# Patient Record
Sex: Female | Born: 1935 | Race: Black or African American | Hispanic: No | Marital: Married | Smoking: Never smoker
Health system: Southern US, Community
[De-identification: ages and names within clinical notes are randomized; demographics above are authoritative.]

## PROBLEM LIST (undated history)

## (undated) DIAGNOSIS — E079 Disorder of thyroid, unspecified: Secondary | ICD-10-CM

## (undated) DIAGNOSIS — I10 Essential (primary) hypertension: Secondary | ICD-10-CM

---

## 2005-12-05 ENCOUNTER — Ambulatory Visit: Payer: Self-pay | Admitting: Family Medicine

## 2007-03-22 ENCOUNTER — Emergency Department: Payer: Self-pay

## 2008-03-09 ENCOUNTER — Emergency Department: Payer: Self-pay | Admitting: Emergency Medicine

## 2008-03-09 ENCOUNTER — Other Ambulatory Visit: Payer: Self-pay

## 2008-05-09 ENCOUNTER — Ambulatory Visit: Payer: Self-pay | Admitting: Gastroenterology

## 2010-11-28 ENCOUNTER — Ambulatory Visit: Payer: Self-pay | Admitting: Ophthalmology

## 2010-12-12 ENCOUNTER — Ambulatory Visit: Payer: Self-pay | Admitting: Ophthalmology

## 2011-04-12 ENCOUNTER — Ambulatory Visit: Payer: Self-pay | Admitting: Internal Medicine

## 2014-03-21 ENCOUNTER — Ambulatory Visit: Payer: Self-pay | Admitting: Otolaryngology

## 2014-04-07 ENCOUNTER — Ambulatory Visit: Payer: Self-pay | Admitting: Otolaryngology

## 2014-06-24 ENCOUNTER — Ambulatory Visit: Payer: Self-pay | Admitting: Gastroenterology

## 2018-12-03 ENCOUNTER — Other Ambulatory Visit: Payer: Self-pay

## 2018-12-03 ENCOUNTER — Encounter: Payer: Self-pay | Admitting: Emergency Medicine

## 2018-12-03 ENCOUNTER — Emergency Department
Admission: EM | Admit: 2018-12-03 | Discharge: 2018-12-03 | Disposition: A | Discharge status: Home / Self Care | Payer: Medicare Other | Attending: Emergency Medicine | Admitting: Emergency Medicine

## 2018-12-03 DIAGNOSIS — I10 Essential (primary) hypertension: Secondary | ICD-10-CM | POA: Insufficient documentation

## 2018-12-03 DIAGNOSIS — R4182 Altered mental status, unspecified: Secondary | ICD-10-CM | POA: Diagnosis present

## 2018-12-03 DIAGNOSIS — E860 Dehydration: Secondary | ICD-10-CM | POA: Diagnosis not present

## 2018-12-03 HISTORY — DX: Essential (primary) hypertension: I10

## 2018-12-03 HISTORY — DX: Disorder of thyroid, unspecified: E07.9

## 2018-12-03 LAB — COMPREHENSIVE METABOLIC PANEL
ALBUMIN: 4.2 g/dL (ref 3.5–5.0)
ALT: 14 U/L (ref 0–44)
ANION GAP: 6 (ref 5–15)
AST: 18 U/L (ref 15–41)
Alkaline Phosphatase: 42 U/L (ref 38–126)
BILIRUBIN TOTAL: 0.9 mg/dL (ref 0.3–1.2)
BUN: 8 mg/dL (ref 8–23)
CALCIUM: 9.2 mg/dL (ref 8.9–10.3)
CO2: 27 mmol/L (ref 22–32)
CREATININE: 0.67 mg/dL (ref 0.44–1.00)
Chloride: 101 mmol/L (ref 98–111)
GFR calc Af Amer: >60 mL/min (ref >60)
GFR calc non Af Amer: >60 mL/min (ref >60)
GLUCOSE: 101 mg/dL — AB (ref 70–99)
Potassium: 3.3 mmol/L — ABNORMAL LOW (ref 3.5–5.1)
Sodium: 134 mmol/L — ABNORMAL LOW (ref 135–145)
TOTAL PROTEIN: 7.9 g/dL (ref 6.5–8.1)

## 2018-12-03 LAB — CBC
HCT: 38.2 % (ref 36.0–46.0)
Hemoglobin: 12.4 g/dL (ref 12.0–15.0)
MCH: 28.1 pg (ref 26.0–34.0)
MCHC: 32.5 g/dL (ref 30.0–36.0)
MCV: 86.6 fL (ref 80.0–100.0)
Platelets: 214 10*3/uL (ref 150–400)
RBC: 4.41 MIL/uL (ref 3.87–5.11)
RDW: 14.6 % (ref 11.5–15.5)
WBC: 7.2 10*3/uL (ref 4.0–10.5)
nRBC: 0 % (ref 0.0–0.2)

## 2018-12-03 LAB — URINALYSIS, COMPLETE (UACMP) WITH MICROSCOPIC
Bacteria, UA: NONE SEEN
Bilirubin Urine: NEGATIVE
Glucose, UA: NEGATIVE mg/dL
Hgb urine dipstick: NEGATIVE
Ketones, ur: NEGATIVE mg/dL
Leukocytes,Ua: NEGATIVE
Nitrite: NEGATIVE
Protein, ur: NEGATIVE mg/dL
SPECIFIC GRAVITY, URINE: 1.001 — AB (ref 1.005–1.030)
Squamous Epithelial / HPF: NONE SEEN (ref 0–5)
WBC, UA: NONE SEEN WBC/hpf (ref 0–5)
pH: 7 (ref 5.0–8.0)

## 2018-12-03 LAB — TROPONIN I: Troponin I: <0.03 ng/mL (ref <0.03)

## 2018-12-03 LAB — GLUCOSE, CAPILLARY: Glucose-Capillary: 83 mg/dL (ref 70–99)

## 2018-12-03 MED ORDER — SODIUM CHLORIDE 0.9 % IV SOLN
Freq: Once | INTRAVENOUS | Status: AC
  Administered 2018-12-03: 13:00:00 via INTRAVENOUS

## 2018-12-03 NOTE — ED Notes (Signed)
Arrived at pt room to discharge- pt, son, and all belongings were gone- pt spoke with doctor prior to discharge but did not speak to nurse or receive discharge papers- no discharge signature obtained

## 2018-12-03 NOTE — ED Notes (Signed)
FIRST NURSE NOTE: son states he wants pt to be checked for poss dehydration and UTI due to increased confusion. PT in NAD, ambulatory

## 2018-12-03 NOTE — ED Provider Notes (Signed)
[facility] Emergency Department Provider Note       Time seen: ----------------------------------------- 12:32 PM on 12/03/2018 -----------------------------------------   I have reviewed the triage vital signs and the nursing notes.  HISTORY   Chief Complaint Altered Mental Status    HPI Nicole Ramsey is a 85 y.o. female with a history of hypertension, thyroid disease, anxiety who presents to the ED for possible dehydration.  Son reports she has not had anything to eat or drink today, did drink yesterday.  Patient states when she gets upset she does not eat anything.  He denies any recent illness she denies any complaints at this time.  Past Medical History:  Diagnosis Date  . Hypertension   . Thyroid disease     There are no active problems to display for this patient.   History reviewed. No pertinent surgical history.  Allergies Patient has no allergy information on record.  Social History Social History   Tobacco Use  . Smoking status: Never Smoker  Substance Use Topics  . Alcohol use: Not Currently  . Drug use: Not on file   Review of Systems Constitutional: Negative for fever. Cardiovascular: Negative for chest pain. Respiratory: Negative for shortness of breath. Gastrointestinal: Negative for abdominal pain, vomiting and diarrhea. Musculoskeletal: Negative for back pain. Skin: Negative for rash. Neurological: Negative for headaches, focal weakness or numbness.  All systems negative/normal/unremarkable except as stated in the HPI  ____________________________________________   PHYSICAL EXAM:  VITAL SIGNS: ED Triage Vitals  Enc Vitals Group     BP 12/03/18 1204 (!) 153/58     Pulse Rate 12/03/18 1204 (!) 59     Resp 12/03/18 1204 16     Temp 12/03/18 1204 98.3 F (36.8 C)     Temp Source 12/03/18 1204 Oral     SpO2 12/03/18 1204 100 %     Weight 12/03/18 1205 130 lb (59 kg)     Height 12/03/18 1205 5\' 5"  (1.651  m)     Head Circumference --      Peak Flow --      Pain Score 12/03/18 1204 0     Pain Loc --      Pain Edu? --      Excl. in GC? --    Constitutional: Well appearing and in no distress. ENT      Head: Normocephalic and atraumatic.      Nose: No congestion/rhinnorhea.      Mouth/Throat: Mucous membranes are moist.      Neck: No stridor. Cardiovascular: Normal rate, regular rhythm. No murmurs, rubs, or gallops. Respiratory: Normal respiratory effort without tachypnea nor retractions. Breath sounds are clear and equal bilaterally. No wheezes/rales/rhonchi. Gastrointestinal: Soft and nontender. Normal bowel sounds Musculoskeletal: Nontender with normal range of motion in extremities. No lower extremity tenderness nor edema. Neurologic:  Normal speech and language. No gross focal neurologic deficits are appreciated.  Skin:  Skin is warm, dry and intact. No rash noted. Psychiatric: Mood and affect are normal. Speech and behavior are normal.  ____________________________________________  ED COURSE:  As part of my medical decision making, I reviewed the following data within the electronic MEDICAL RECORD NUMBER History obtained from family if available, nursing notes, old chart and ekg, as well as notes from prior ED visits. Patient presented for weakness, we will assess with labs and imaging as indicated at this time.   Procedures ____________________________________________   LABS (pertinent positives/negatives)  Labs Reviewed  COMPREHENSIVE METABOLIC PANEL - Abnormal; Notable  for the following components:      Result Value   Sodium 134 (*)    Potassium 3.3 (*)    Glucose, Bld 101 (*)    All other components within normal limits  URINALYSIS, COMPLETE (UACMP) WITH MICROSCOPIC - Abnormal; Notable for the following components:   Color, Urine COLORLESS (*)    APPearance CLEAR (*)    Specific Gravity, Urine 1.001 (*)    All other components within normal limits  CBC  TROPONIN I   GLUCOSE, CAPILLARY  CBG MONITORING, ED   ___________________________________________   DIFFERENTIAL DIAGNOSIS   Dehydration, occult infection, electrolyte abnormality, depression, anxiety  FINAL ASSESSMENT AND PLAN  Dehydration   Plan: The patient had presented for weakness and possible dehydration. Patient's labs were reassuring, although we did give her a liter of fluids.  No acute emergency medical condition was identified.  She appears to have possible mild dementia with memory changes and some paranoid behavior.  I will advise close outpatient follow-up with her doctor.   Ulice Dash, MD    Note: This note was generated in part or whole with voice recognition software. Voice recognition is usually quite accurate but there are transcription errors that can and very often do occur. I apologize for any typographical errors that were not detected and corrected.     Emily Filbert, MD 12/03/18 1434

## 2018-12-03 NOTE — ED Notes (Signed)
Pt states not liking being home alone at night- her daughter usually takes care of her but is unable to d/t having a fall- son states that she has been calling about someone being in her house at night and calls someone to check on her and no one else is in the house- states she is fine one day and then other days she seems "off"

## 2018-12-03 NOTE — ED Notes (Signed)
Pt and family refused EKG

## 2018-12-03 NOTE — ED Triage Notes (Signed)
Pt arrives with son. Pt denies complaints in triage. Son states he is concerned over possible dehydration. Son also states pt has "been acting strange." Son expressed her ams has been a progression over the last 4 months but this is the first time she has been evaluated for it. Son from out of town and wanted to make sure she was evaluated prior to his travel home. In triage pt a& o x 4.

## 2018-12-11 ENCOUNTER — Emergency Department
Admission: EM | Admit: 2018-12-11 | Discharge: 2018-12-11 | Discharge status: Against Medical Advice | Payer: Medicare Other | Attending: Emergency Medicine | Admitting: Emergency Medicine

## 2018-12-11 ENCOUNTER — Encounter: Payer: Self-pay | Admitting: Emergency Medicine

## 2018-12-11 ENCOUNTER — Emergency Department: Payer: Medicare Other

## 2018-12-11 ENCOUNTER — Other Ambulatory Visit: Payer: Self-pay

## 2018-12-11 DIAGNOSIS — R4182 Altered mental status, unspecified: Secondary | ICD-10-CM

## 2018-12-11 DIAGNOSIS — Z5321 Procedure and treatment not carried out due to patient leaving prior to being seen by health care provider: Secondary | ICD-10-CM | POA: Insufficient documentation

## 2018-12-11 NOTE — ED Notes (Signed)
Patients son walked patient out to leave ER at this time. Reports he is leaving.

## 2018-12-11 NOTE — ED Triage Notes (Signed)
PT arrives with family. Pt was seen and evaluated last week for the same complaints. Pt's family states he brought his mother in today to seek resources for home health. Pt initially was taken to [facility] for resources who directed pt to come to the ED. No new complaints addressed.

## 2018-12-11 NOTE — ED Notes (Signed)
Son present with patient. Reports he does not want blood or EKG completed. MD Darnelle Catalan made aware and reports he will step back in room to speak with son.

## 2018-12-11 NOTE — ED Provider Notes (Signed)
[facility] Emergency Department Provider Note   ____________________________________________   First MD Initiated Contact with Patient 12/11/18 1729     (approximate)  I have reviewed the triage vital signs and the nursing notes.   HISTORY  Chief Complaint Altered Mental Status   HPI Nicole Ramsey is a 85 y.o. female who was referred here from Dr. Stasia Cavalier office.  Dr. Laural Benes came and spoke with me personally about this patient.  He reported that she has become somewhat paranoid blocking off some of her doors and and living in the corner of the bedroom as I understand it.  She has had home health arranged and home health nurse has been there and reported she is not acting right.  Dr. Letitia Libra wanted me to check on her medically get a head CT TSH etc. and then have psychiatry see her.  I spoke with the son as the patient was going off to get her head CT and told him I would be seeing her shortly and had put some orders in the computer.  The nurse went to get the blood work and the son says he she just had blood work which she did on the fifth.  And he did not want anymore.  I was at the time involved in seeing 2 patients with shortness of breath and chest pain and asked the tech to let them know that I would be in there as soon as I could and see the patient and also that I was waiting for the results of the head CT.  Patient then apparently got with her son and they walked out.  They did not even tell the nurse nurse did see them as they walked out and tried to stop them.     Past Medical History:  Diagnosis Date  . Hypertension   . Thyroid disease     There are no active problems to display for this patient.   History reviewed. No pertinent surgical history.  Prior to Admission medications   Not on File    Allergies Patient has no allergy information on record.  No family history on file.  Social History Social History   Tobacco Use  .  Smoking status: Never Smoker  Substance Use Topics  . Alcohol use: Not Currently  . Drug use: Not on file    Review of Systems Unable to perform as patient and family were uncooperative and left ____________________________________________   PHYSICAL EXAM:  Unable to perform as patient and family were uncooperative and left __________________________________   LABS (all labs ordered are listed, but only abnormal results are displayed)  Labs Reviewed  COMPREHENSIVE METABOLIC PANEL  TROPONIN I  CBC WITH DIFFERENTIAL/PLATELET  URINALYSIS, COMPLETE (UACMP) WITH MICROSCOPIC  URINE DRUG SCREEN, QUALITATIVE ([facility] ONLY)  TSH   ____________________________________________  EKG  _______________________________________  RADIOLOGY  ED MD interpretation: CT read by radiology as no acute disease volume loss with patchy periventricular and small vessel disease son refused a chest x-ray which I would have used to check for pneumonia or CHF cardiomegaly etc.  Official radiology report(s): Ct Head Wo Contrast  Result Date: 12/11/2018 CLINICAL DATA:  Increased confusion EXAM: CT HEAD WITHOUT CONTRAST TECHNIQUE: Contiguous axial images were obtained from the base of the skull through the vertex without intravenous contrast. COMPARISON:  March 22, 2007 FINDINGS: Brain: There is age related volume loss. There is no intracranial mass, hemorrhage, extra-axial fluid collection, or midline shift. There is patchy small vessel disease  in the centra semiovale bilaterally. No acute infarct is appreciable on this study. Vascular: There is no appreciable hyperdense vessel. There is calcification in each carotid siphon region. Skull: The bony calvarium appears intact. Sinuses/Orbits: Visualized paranasal sinuses are clear. Visualized orbits appear symmetric bilaterally. Other: Mastoid air cells are clear. IMPRESSION: Age related volume loss with patchy periventricular small vessel disease. No acute infarct  evident. No mass or hemorrhage. There are foci of arterial vascular calcification. Electronically Signed   By: Bretta Bang III M.D.   On: 12/11/2018 17:53    ____________________________________________   PROCEDURES  Procedure(s) performed (including Critical Care):  Procedures   ____________________________________________   INITIAL IMPRESSION / ASSESSMENT AND PLAN / ED COURSE  Son and patient refused to wait for me while I was busy seeing acutely ill patients.  They understood that they were supposed to be seen by psychiatry as well but would not wait for that either.            ____________________________________________   FINAL CLINICAL IMPRESSION(S) / ED DIAGNOSES  Final diagnoses:  Altered mental status, unspecified altered mental status type     ED Discharge Orders    None       Note:  This document was prepared using Dragon voice recognition software and may include unintentional dictation errors.    Arnaldo Natal, MD 12/11/18 Ebony Cargo

## 2018-12-11 NOTE — ED Notes (Addendum)
Patients son brought pt in for additional resources to get her help at home. Reports he was just here on 5th and only needs to speak with someone about resources and that her MD wanted her to be evaluated by psychiatrist. Also states he wanted mom to have a MRI possibly. Patient is disoriented X4 during assessment. Cooperative with son at bedside

## 2019-11-15 ENCOUNTER — Ambulatory Visit: Payer: Medicare Other | Attending: Internal Medicine

## 2019-11-15 DIAGNOSIS — Z23 Encounter for immunization: Secondary | ICD-10-CM

## 2019-11-15 NOTE — Progress Notes (Signed)
   Covid-19 Vaccination Clinic  Name:  REBACCA VOTAW    MRN: [id number] DOB: 1927/07/01  11/15/2019  Ms. Koob was observed post Covid-19 immunization for 15 minutes without incidence. She was provided with Vaccine Information Sheet and instruction to access the V-Safe system.   Ms. Dwyer was instructed to call 911 with any severe reactions post vaccine: Marland Kitchen Difficulty breathing  . Swelling of your face and throat  . A fast heartbeat  . A bad rash all over your body  . Dizziness and weakness    Immunizations Administered    Name Date Dose VIS Date Route   Pfizer COVID-19 Vaccine 11/15/2019 12:05 PM 0.3 mL 09/10/2019 Intramuscular   Manufacturer: [employer], [employer]   Lot: [id number]   NDC: [address]-4

## 2019-12-14 ENCOUNTER — Ambulatory Visit: Payer: Medicare Other | Attending: Internal Medicine

## 2019-12-14 DIAGNOSIS — Z23 Encounter for immunization: Secondary | ICD-10-CM

## 2019-12-14 NOTE — Progress Notes (Signed)
   Covid-19 Vaccination Clinic  Name:  LAINA GUERRIERI    MRN: [id number] DOB: [birth year]  12/14/2019  Ms. Mountjoy was observed post Covid-19 immunization for 15 minutes without incident. She was provided with Vaccine Information Sheet and instruction to access the V-Safe system.   Ms. Livesay was instructed to call 911 with any severe reactions post vaccine: Marland Kitchen Difficulty breathing  . Swelling of face and throat  . A fast heartbeat  . A bad rash all over body  . Dizziness and weakness   Immunizations Administered    Name Date Dose VIS Date Route   Pfizer COVID-19 Vaccine 12/14/2019 12:42 PM 0.3 mL 09/10/2019 Intramuscular   Manufacturer: [employer], [employer]   Lot: [id number]   NDC: [address]-2

## 2020-10-27 ENCOUNTER — Other Ambulatory Visit: Payer: Self-pay

## 2020-10-27 ENCOUNTER — Inpatient Hospital Stay
Admission: EM | Admit: 2020-10-27 | Discharge: 2020-11-10 | DRG: 885 | Disposition: A | Discharge status: Skilled Nursing Facility | Payer: Medicare Other | Attending: Internal Medicine | Admitting: Internal Medicine

## 2020-10-27 ENCOUNTER — Emergency Department: Payer: Medicare Other

## 2020-10-27 DIAGNOSIS — Z9010 Acquired absence of unspecified breast and nipple: Secondary | ICD-10-CM

## 2020-10-27 DIAGNOSIS — R54 Age-related physical debility: Secondary | ICD-10-CM | POA: Diagnosis present

## 2020-10-27 DIAGNOSIS — R9082 White matter disease, unspecified: Secondary | ICD-10-CM | POA: Diagnosis present

## 2020-10-27 DIAGNOSIS — D890 Polyclonal hypergammaglobulinemia: Secondary | ICD-10-CM | POA: Diagnosis present

## 2020-10-27 DIAGNOSIS — Z6821 Body mass index (BMI) 21.0-21.9, adult: Secondary | ICD-10-CM

## 2020-10-27 DIAGNOSIS — I4510 Unspecified right bundle-branch block: Secondary | ICD-10-CM | POA: Diagnosis present

## 2020-10-27 DIAGNOSIS — F331 Major depressive disorder, recurrent, moderate: Principal | ICD-10-CM

## 2020-10-27 DIAGNOSIS — I10 Essential (primary) hypertension: Secondary | ICD-10-CM

## 2020-10-27 DIAGNOSIS — Z8619 Personal history of other infectious and parasitic diseases: Secondary | ICD-10-CM

## 2020-10-27 DIAGNOSIS — E43 Unspecified severe protein-calorie malnutrition: Secondary | ICD-10-CM | POA: Insufficient documentation

## 2020-10-27 DIAGNOSIS — F22 Delusional disorders: Secondary | ICD-10-CM

## 2020-10-27 DIAGNOSIS — R55 Syncope and collapse: Secondary | ICD-10-CM | POA: Diagnosis present

## 2020-10-27 DIAGNOSIS — F419 Anxiety disorder, unspecified: Secondary | ICD-10-CM | POA: Diagnosis present

## 2020-10-27 DIAGNOSIS — R627 Adult failure to thrive: Secondary | ICD-10-CM

## 2020-10-27 DIAGNOSIS — Z8249 Family history of ischemic heart disease and other diseases of the circulatory system: Secondary | ICD-10-CM

## 2020-10-27 DIAGNOSIS — G309 Alzheimer's disease, unspecified: Secondary | ICD-10-CM

## 2020-10-27 DIAGNOSIS — Z23 Encounter for immunization: Secondary | ICD-10-CM

## 2020-10-27 DIAGNOSIS — R440 Auditory hallucinations: Secondary | ICD-10-CM

## 2020-10-27 DIAGNOSIS — Z66 Do not resuscitate: Secondary | ICD-10-CM | POA: Diagnosis not present

## 2020-10-27 DIAGNOSIS — R946 Abnormal results of thyroid function studies: Secondary | ICD-10-CM | POA: Diagnosis present

## 2020-10-27 DIAGNOSIS — Z20822 Contact with and (suspected) exposure to covid-19: Secondary | ICD-10-CM | POA: Diagnosis present

## 2020-10-27 DIAGNOSIS — K219 Gastro-esophageal reflux disease without esophagitis: Secondary | ICD-10-CM | POA: Diagnosis present

## 2020-10-27 DIAGNOSIS — F02818 Alzheimer's disease, unspecified: Secondary | ICD-10-CM

## 2020-10-27 DIAGNOSIS — R4182 Altered mental status, unspecified: Secondary | ICD-10-CM | POA: Diagnosis not present

## 2020-10-27 DIAGNOSIS — F0281 Dementia in other diseases classified elsewhere with behavioral disturbance: Secondary | ICD-10-CM | POA: Diagnosis present

## 2020-10-27 DIAGNOSIS — K5900 Constipation, unspecified: Secondary | ICD-10-CM | POA: Diagnosis not present

## 2020-10-27 DIAGNOSIS — E039 Hypothyroidism, unspecified: Secondary | ICD-10-CM

## 2020-10-27 DIAGNOSIS — Z90710 Acquired absence of both cervix and uterus: Secondary | ICD-10-CM

## 2020-10-27 LAB — URINALYSIS, COMPLETE (UACMP) WITH MICROSCOPIC
Bacteria, UA: NONE SEEN
Bilirubin Urine: NEGATIVE
Glucose, UA: NEGATIVE mg/dL
Hgb urine dipstick: NEGATIVE
Ketones, ur: NEGATIVE mg/dL
Leukocytes,Ua: NEGATIVE
Nitrite: NEGATIVE
Protein, ur: NEGATIVE mg/dL
Specific Gravity, Urine: 1.011 (ref 1.005–1.030)
pH: 7 (ref 5.0–8.0)

## 2020-10-27 LAB — CBC
HCT: 39.7 % (ref 36.0–46.0)
Hemoglobin: 12.7 g/dL (ref 12.0–15.0)
MCH: 28.5 pg (ref 26.0–34.0)
MCHC: 32 g/dL (ref 30.0–36.0)
MCV: 89 fL (ref 80.0–100.0)
Platelets: 166 10*3/uL (ref 150–400)
RBC: 4.46 MIL/uL (ref 3.87–5.11)
RDW: 14.8 % (ref 11.5–15.5)
WBC: 3.9 10*3/uL — ABNORMAL LOW (ref 4.0–10.5)
nRBC: 0 % (ref 0.0–0.2)

## 2020-10-27 LAB — COMPREHENSIVE METABOLIC PANEL
ALT: 10 U/L (ref 0–44)
AST: 15 U/L (ref 15–41)
Albumin: 3.9 g/dL (ref 3.5–5.0)
Alkaline Phosphatase: 48 U/L (ref 38–126)
Anion gap: 8 (ref 5–15)
BUN: 11 mg/dL (ref 8–23)
CO2: 28 mmol/L (ref 22–32)
Calcium: 9.4 mg/dL (ref 8.9–10.3)
Chloride: 105 mmol/L (ref 98–111)
Creatinine, Ser: 0.85 mg/dL (ref 0.44–1.00)
GFR, Estimated: >60 mL/min (ref >60)
Glucose, Bld: 114 mg/dL — ABNORMAL HIGH (ref 70–99)
Potassium: 3.8 mmol/L (ref 3.5–5.1)
Sodium: 141 mmol/L (ref 135–145)
Total Bilirubin: 1 mg/dL (ref 0.3–1.2)
Total Protein: 7.4 g/dL (ref 6.5–8.1)

## 2020-10-27 LAB — CBG MONITORING, ED: Glucose-Capillary: 113 mg/dL — ABNORMAL HIGH (ref 70–99)

## 2020-10-27 LAB — TSH: TSH: 11.17 u[IU]/mL — ABNORMAL HIGH (ref 0.350–4.500)

## 2020-10-27 MED ORDER — LEVOTHYROXINE SODIUM 50 MCG PO TABS
50.0000 ug | ORAL_TABLET | Freq: Every day | ORAL | Status: DC
  Administered 2020-10-28 – 2020-10-29 (×2): 50 ug via ORAL
  Filled 2020-10-27 (×2): qty 1

## 2020-10-27 MED ORDER — ESCITALOPRAM OXALATE 10 MG PO TABS
5.0000 mg | ORAL_TABLET | Freq: Every day | ORAL | Status: DC
  Administered 2020-10-27 – 2020-11-10 (×15): 5 mg via ORAL
  Filled 2020-10-27 (×5): qty 0.5
  Filled 2020-10-27: qty 1
  Filled 2020-10-27 (×4): qty 0.5
  Filled 2020-10-27 (×3): qty 1
  Filled 2020-10-27 (×3): qty 0.5

## 2020-10-27 MED ORDER — ONDANSETRON 4 MG PO TBDP
ORAL_TABLET | ORAL | Status: AC
  Filled 2020-10-27: qty 1

## 2020-10-27 MED ORDER — ATENOLOL 50 MG PO TABS
50.0000 mg | ORAL_TABLET | Freq: Every day | ORAL | Status: DC
  Administered 2020-10-27 – 2020-11-10 (×15): 50 mg via ORAL
  Filled 2020-10-27 (×2): qty 1
  Filled 2020-10-27: qty 2
  Filled 2020-10-27 (×2): qty 1
  Filled 2020-10-27: qty 2
  Filled 2020-10-27: qty 1
  Filled 2020-10-27: qty 2
  Filled 2020-10-27 (×6): qty 1
  Filled 2020-10-27: qty 2

## 2020-10-27 MED ORDER — PANTOPRAZOLE SODIUM 40 MG PO TBEC
40.0000 mg | DELAYED_RELEASE_TABLET | Freq: Every day | ORAL | Status: DC
  Administered 2020-10-27 – 2020-11-10 (×15): 40 mg via ORAL
  Filled 2020-10-27 (×15): qty 1

## 2020-10-27 MED ORDER — QUETIAPINE FUMARATE 25 MG PO TABS
25.0000 mg | ORAL_TABLET | Freq: Every day | ORAL | Status: DC
  Administered 2020-10-27 – 2020-11-09 (×13): 25 mg via ORAL
  Filled 2020-10-27 (×14): qty 1

## 2020-10-27 MED ORDER — AMLODIPINE BESYLATE 10 MG PO TABS
10.0000 mg | ORAL_TABLET | Freq: Every day | ORAL | Status: DC
  Administered 2020-10-27 – 2020-11-10 (×15): 10 mg via ORAL
  Filled 2020-10-27 (×4): qty 1
  Filled 2020-10-27 (×2): qty 2
  Filled 2020-10-27 (×3): qty 1
  Filled 2020-10-27: qty 2
  Filled 2020-10-27: qty 1
  Filled 2020-10-27: qty 2
  Filled 2020-10-27 (×3): qty 1

## 2020-10-27 NOTE — ED Notes (Signed)
Pt discusses with Dr. Toni Amend that she does not currently feel comfortable going back home by herself. She does state that she would feel more comfortable if a family member stayed home with her. States she does not think she needs psychiatric inpt admission

## 2020-10-27 NOTE — ED Notes (Signed)
Pt able to stand and pivot to the restroom at this time.  

## 2020-10-27 NOTE — ED Notes (Signed)
Dr. Penne Lash at bedside for evaluation.

## 2020-10-27 NOTE — BH Assessment (Signed)
Comprehensive Clinical Assessment (CCA) Screening, Triage and Referral Note  10/27/2020 Nicole Ramsey [id number]   Nicole Ramsey is a 87 y.o female who presents to [facility] ED voluntarily for treatment. Per triage note, BIB [facility] from home due to AMS. Pt alert to self, place and situation. States that she lives alone in a big house and is scared to be at home alone.  During TTS assessment pt presents calm, pleasant, sad, slow/soft speech, some disorganization but retractable and oriented x 5, anxious but cooperative, and mood-congruent with affect. The pt does not appear to be responding to internal or external stimuli. Neither is the pt presenting with any delusional thinking. Pt was able to verify the information provided to triage RN and identified her main compliant to be increased AH, depression, paranoia, and fear of living alone since December 2021. Pt denies any recent changes taking place but shared about the loss of her oldest foster daughter who spiled her and cared for her "very well" before she passed away 4 years ago. Pt reports feeling fearful to live alone due someone contacting her threatening to kill her and the strange noises she identifies as a machine with a red light outside her window keeping her awake and causing her to feel nervous. Pt reports to have an idea of who the person is but states "I don't like to tell nobody who". Pt identified loneliness, lack of contact with family, living alone, loss and sadness as the triggers to her current symptoms. Pt reports the AH to command her to get in and out of bed at all hours of the night, randomly dress/undress herself and threats to harm her if she does not listen to their commands. Pt reports to have one daughter that is local but busy all the time and other family supports to be distant. Pt states "I just feel so alone in my home all day by myself with no one to talk to and hearing these voices it's aggravating". Pt reports a lack of  appetite, the loss of 24lbs in the last few weeks and admits to poor eating habits.  Pt reports some struggles sleeping stating, "sometimes I sleep les than 7 hours sometimes I sleep more it just depends". Pt reports to be able to perform all ADL's when she is not feeling sad or frustrated with her current symptoms. Pt states, "taking care of myself has become a little rocky because I am nervous someone is going to hurt me". Pt denies a MH hx or to be taking any currently medications for MH. Pt report compliance with her HBP and thyroid medication until today. Pt denies a hx of dementia or SI but per chart, has a hx of Hypertension and hallucinations. Pt denies any current use or hx of SA. Pt denies an INPT/OPT hx. Pt denies a family hx of MH/SA but made a vague statement regarding people in her family feeling nervous. Pt expressed to need help with her AH and living alone. Pt states "If I just had someone to stop by and check on me more, I would be ok, but I guess as you get older nobody wants to visit you as much". Pt denies any current SI/HI/VH but is currently unable to contract for safety, stating "I don't think I am safe returning home".   Per Dr. Toni Amend pt does not meet criteria for INPT.   Chief Complaint: MDD Chief Complaint  Patient presents with  . Altered Mental Status   Visit Diagnosis:  Patient Reported Information How did you hear about [place]? Self   Referral name: No data recorded  Referral phone number: No data recorded Whom do you see for routine medical problems? Other (Comment)   Practice/Facility Name: No data recorded  Practice/Facility Phone Number: No data recorded  Name of Contact: No data recorded  Contact Number: No data recorded  Contact Fax Number: No data recorded  Prescriber Name: No data recorded  Prescriber Address (if known): No data recorded What Is the Reason for Your Visit/Call Today? AH  How Long Has This Been Causing You Problems? 1 wk - 1  month  Have You Recently Been in Any Inpatient Treatment (Hospital/Detox/Crisis Center/28-Day Program)? No   Name/Location of Program/Hospital:No data recorded  How Long Were You There? No data recorded  When Were You Discharged? No data recorded Have You Ever Received Services From [facility] Before? Yes   Who Do You See at [facility]? ED  Have You Recently Had Any Thoughts About Hurting Yourself? No   Are You Planning to Commit Suicide/Harm Yourself At This time?  No  Have you Recently Had Thoughts About Hurting Someone Nicole Ramsey? No   Explanation: No data recorded Have You Used Any Alcohol or Drugs in the Past 24 Hours? No   How Long Ago Did You Use Drugs or Alcohol?  No data recorded  What Did You Use and How Much? No data recorded What Do You Feel Would Help You the Most Today? Other (Comment) (Assisted living/Peer Support)  Do You Currently Have a Therapist/Psychiatrist? No   Name of Therapist/Psychiatrist: No data recorded  Have You Been Recently Discharged From Any Office Practice or Programs? No   Explanation of Discharge From Practice/Program:  No data recorded    CCA Screening Triage Referral Assessment Type of Contact: Tele-Assessment   Is this Initial or Reassessment? Initial Assessment   Date Telepsych consult ordered in [facility]:  10/27/2020   Time Telepsych consult ordered in [facility]:  0718  Patient Reported Information Reviewed? Yes   Patient Left Without Being Seen? No data recorded  Reason for Not Completing Assessment: No data recorded Collateral Involvement: Nicole Ramsey [phone]  Does Patient Have a Court Appointed Legal Guardian? No data recorded  Name and Contact of Legal Guardian:  No data recorded If Minor and Not Living with Parent(s), Who has Custody? n/a  Is CPS involved or ever been involved? Never  Is APS involved or ever been involved? Never  Patient Determined To Be At Risk for Harm To Self or Others Based on Review of Patient Reported  Information or Presenting Complaint? No   Method: No data recorded  Availability of Means: No data recorded  Intent: No data recorded  Notification Required: No data recorded  Additional Information for Danger to Others Potential:  No data recorded  Additional Comments for Danger to Others Potential:  No data recorded  Are There Guns or Other Weapons in Your Home?  No data recorded   Types of Guns/Weapons: No data recorded   Are These Weapons Safely Secured?                              No data recorded   Who Could Verify You Are Able To Have These Secured:    No data recorded Do You Have any Outstanding Charges, Pending Court Dates, Parole/Probation? No data recorded Contacted To Inform of Risk of Harm To Self or Others: No data  recorded Location of Assessment: [facility] ED  Does Patient Present under Involuntary Commitment? No   IVC Papers Initial File Date: No data recorded  [place] of Residence: [place]  Patient Currently Receiving the Following Services: Not Receiving Services   Determination of Need: Emergent (2 hours)   Options For Referral: Intensive Outpatient Therapy   Opal Sidles, LCSWA

## 2020-10-27 NOTE — ED Notes (Signed)
Pt presents to the ED. Per pt, pt is here because she is scared to live alone. Pt states that someone told her they were out to kill her. Pt states she lives by herself in a big house and she gets nervous when she live alone. Pt is alert and oriented x4. Pt states she doesn't know how long she has heard the voices.

## 2020-10-27 NOTE — ED Notes (Signed)
Dr. Clapacs at bedside at this time.  

## 2020-10-27 NOTE — BH Assessment (Signed)
TTS is currently awaiting to assess pt after xray's are completed. Pt current RN Morrie Sheldon) agrees to make contact with TTS when pt is ready.

## 2020-10-27 NOTE — ED Notes (Signed)
Pt given a sandwich tray and water °

## 2020-10-27 NOTE — ED Triage Notes (Signed)
BIB [facility] from home due to AMS.  Pt alert to self, place and situation. States that she lives alone in a big house and is scared to be at home alone.

## 2020-10-27 NOTE — ED Notes (Signed)
First RN note:  Pt comes into the ED via [facility] from home c/o hallucinations. Per daughter, this is a reoccurring issue.  204/86, 85 HR, 95% RA. Pt oriented to self.  CBG 126.

## 2020-10-27 NOTE — ED Notes (Signed)
Pt ordered soft diet per request. Pt states she has difficulty with her teeth

## 2020-10-27 NOTE — ED Provider Notes (Signed)
[facility] Emergency Department Provider Note  ____________________________________________   Event Date/Time   First MD Initiated Contact with Patient 10/27/20 1120     (approximate)  I have reviewed the triage vital signs and the nursing notes.   HISTORY  Chief Complaint Altered Mental Status    HPI Nicole Ramsey is a 87 y.o. female with history of hypertension here with reported hallucinations and paranoia.  History is provided by the patient.  She states that over the last week or so, she has had increasing difficulty with anxiety.  She lives alone and states that she feels very uncomfortable living alone.  She states that she has lost appetite and has been increasingly anxious.  She has been hearing occasional voices.  She has been very paranoid and feels like people are out to steal her things, and she is no longer eating and drinking due to this.  She reportedly has called her PCP about this.  Over the last 24 hours, symptoms have gotten worse.  She got very anxious and scared overnight.  She subsequent presents for evaluation.  She feels like she is not safe in her home alone, and is requesting assistance with this.  She does have family locally, but they are not able to provide 24-hour care.        Past Medical History:  Diagnosis Date  . Hypertension   . Thyroid disease     There are no problems to display for this patient.   History reviewed. No pertinent surgical history.  Prior to Admission medications   Not on File    Allergies Patient has no known allergies.  No family history on file.  Social History Social History   Tobacco Use  . Smoking status: Never Smoker  Substance Use Topics  . Alcohol use: Not Currently    Review of Systems  Review of Systems  Constitutional: Positive for fatigue. Negative for chills and fever.  HENT: Negative for sore throat.   Respiratory: Negative for shortness of breath.    Cardiovascular: Negative for chest pain.  Gastrointestinal: Negative for abdominal pain.  Genitourinary: Negative for flank pain.  Musculoskeletal: Negative for neck pain.  Skin: Negative for rash and wound.  Allergic/Immunologic: Negative for immunocompromised state.  Neurological: Negative for weakness and numbness.  Hematological: Does not bruise/bleed easily.  Psychiatric/Behavioral: Positive for hallucinations. The patient is nervous/anxious.   All other systems reviewed and are negative.    ____________________________________________  PHYSICAL EXAM:      VITAL SIGNS: ED Triage Vitals  Enc Vitals Group     BP 10/27/20 0744 (!) 180/66     Pulse Rate 10/27/20 0744 70     Resp 10/27/20 0744 16     Temp 10/27/20 0744 98.7 F (37.1 C)     Temp Source 10/27/20 0744 Oral     SpO2 10/27/20 0744 99 %     Weight 10/27/20 0745 130 lb 1.1 oz (59 kg)     Height 10/27/20 0745 5\' 5"  (1.651 m)     Head Circumference --      Peak Flow --      Pain Score 10/27/20 0745 0     Pain Loc --      Pain Edu? --      Excl. in GC? --      Physical Exam Vitals and nursing note reviewed.  Constitutional:      General: She is not in acute distress.    Appearance: She is  well-developed.  HENT:     Head: Normocephalic and atraumatic.  Eyes:     Conjunctiva/sclera: Conjunctivae normal.  Cardiovascular:     Rate and Rhythm: Normal rate and regular rhythm.     Heart sounds: Normal heart sounds. No murmur heard. No friction rub.  Pulmonary:     Effort: Pulmonary effort is normal. No respiratory distress.     Breath sounds: Normal breath sounds. No wheezing or rales.  Abdominal:     General: There is no distension.     Palpations: Abdomen is soft.     Tenderness: There is no abdominal tenderness.  Musculoskeletal:     Cervical back: Neck supple.  Skin:    General: Skin is warm.     Capillary Refill: Capillary refill takes less than 2 seconds.  Neurological:     Mental Status: She is  alert and oriented to person, place, and time.     Motor: No abnormal muscle tone.       ____________________________________________   LABS (all labs ordered are listed, but only abnormal results are displayed)  Labs Reviewed  COMPREHENSIVE METABOLIC PANEL - Abnormal; Notable for the following components:      Result Value   Glucose, Bld 114 (*)    All other components within normal limits  CBC - Abnormal; Notable for the following components:   WBC 3.9 (*)    All other components within normal limits  URINALYSIS, COMPLETE (UACMP) WITH MICROSCOPIC - Abnormal; Notable for the following components:   Color, Urine YELLOW (*)    APPearance CLEAR (*)    All other components within normal limits  CBG MONITORING, ED - Abnormal; Notable for the following components:   Glucose-Capillary 113 (*)    All other components within normal limits    ____________________________________________  EKG: Normal sinus rhythm, ventricular rate 69.  PR 170, QRS 134, QTc 471.  No acute ST elevations depressions.  No KG evidence of acute ischemia or infarct. ________________________________________  RADIOLOGY All imaging, including plain films, CT scans, and ultrasounds, independently reviewed by me, and interpretations confirmed via formal radiology reads.  ED MD interpretation:   Chest x-ray: Lungs clear  Official radiology report(s): DG Chest 2 View  Result Date: 10/27/2020 CLINICAL DATA:  Altered mental status EXAM: CHEST - 2 VIEW COMPARISON:  None. FINDINGS: Lungs are clear. Heart size and pulmonary vascularity are normal. No adenopathy. There is aortic atherosclerosis. Bones are osteoporotic. IMPRESSION: Lungs clear. Cardiac silhouette normal. Osteoporosis. Aortic Atherosclerosis (ICD10-I70.0). Electronically Signed   By: Bretta Bang III M.D.   On: 10/27/2020 13:14   CT Head Wo Contrast  Result Date: 10/27/2020 CLINICAL DATA:  Altered mental status.  Hallucinations. EXAM: CT HEAD  WITHOUT CONTRAST TECHNIQUE: Contiguous axial images were obtained from the base of the skull through the vertex without intravenous contrast. COMPARISON:  CT head without contrast 12/11/2018 FINDINGS: Brain: Moderate atrophy and white matter changes are similar the prior study. No acute infarct, hemorrhage, or mass lesion is present. Basal ganglia and insular ribbon are normal. The ventricles are proportionate to the degree of atrophy. No significant extraaxial fluid collection is present. Remote lacunar infarct in the right paramedian brainstem is again seen. Brainstem and cerebellum are otherwise unremarkable. Vascular: Atherosclerotic calcifications are present within the cavernous internal carotid arteries. No hyperdense vessel is present. Skull: Hyperostosis noted. Calvarium is otherwise within normal limits. No significant extra-axial fluid collection is present. Sinuses/Orbits: The paranasal sinuses and mastoid air cells are clear. Right lens replacement is present.  Globes and orbits are otherwise within normal limits. IMPRESSION: 1. No acute intracranial abnormality or significant interval change. 2. Stable atrophy and white matter disease. This likely reflects the sequela of chronic microvascular ischemia. Electronically Signed   By: Marin Roberts M.D.   On: 10/27/2020 13:36    ____________________________________________  PROCEDURES   Procedure(s) performed (including Critical Care):  Procedures  ____________________________________________  INITIAL IMPRESSION / MDM / ASSESSMENT AND PLAN / ED COURSE  As part of my medical decision making, I reviewed the following data within the electronic MEDICAL RECORD NUMBER Nursing notes reviewed and incorporated, Old chart reviewed, Notes from prior ED visits, and [place] Controlled Substance Database       *Nicole Ramsey was evaluated in Emergency Department on 10/27/2020 for the symptoms described in the history of present illness. She was  evaluated in the context of the global COVID-19 pandemic, which necessitated consideration that the patient might be at risk for infection with the SARS-CoV-2 virus that causes COVID-19. Institutional protocols and algorithms that pertain to the evaluation of patients at risk for COVID-19 are in a state of rapid change based on information released by regulatory bodies including the CDC and federal and state organizations. These policies and algorithms were followed during the patient's care in the ED.  Some ED evaluations and interventions may be delayed as a result of limited staffing during the pandemic.*     Medical Decision Making: Pleasant 87 year old female here with reported auditory hallucinations and concern for her ability to care for herself at home.  Clinically, she appears nontoxic.  No focal neurological deficits.  CT head is negative.  Doubt CVA or intracranial abnormality.  Her lab work is overall very reassuring.  No signs of UTI or possible infectious etiology.  She does have appear depressed and reports that she does not have much support at home.  Question pseudodementia especially given her auditory hallucinations and absence of known history of dementia, though certainly this could be age-related.  Given her inability to care for self, will have psychiatry evaluate for possible antipsychotic treatment or Geri psych placement, as well as consult social work for possible placement.  ____________________________________________  FINAL CLINICAL IMPRESSION(S) / ED DIAGNOSES  Final diagnoses:  Paranoia (HCC)  Auditory hallucinations     MEDICATIONS GIVEN DURING THIS VISIT:  Medications - No data to display   ED Discharge Orders    None       Note:  This document was prepared using Dragon voice recognition software and may include unintentional dictation errors.   Shaune Pollack, MD 10/27/20 [id number]

## 2020-10-27 NOTE — ED Notes (Signed)
Pt transported to X-Ray at this time.  

## 2020-10-27 NOTE — Consult Note (Signed)
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 female patient admitted with "somebody called me on the phone and told me they were going to kill me".  HPI: Patient seen chart reviewed.  This is a 87 year old woman who came to the hospital today because of complaints of anxiety and auditory hallucinations.  Apparently she spoke to her daughter who had her brought here to the hospital.  Patient was a cooperative historian.  She tells me that somebody called her on the phone at home and told her that they were going to kill her.  She does not know who this was.  It was frightening obviously.  She thinks it only happened one time and is not sure exactly when.  She seems more concerned complaining that there is a noise being made by a machine which she thinks is outside her house and is keeping her up at night.  This suppose it machine makes a loud whining noise and makes a red light that shines into her home and prevents her from sleeping.  She thinks this is been going on for several days.  Apparently she has called the police about it but says as soon as the police show up the whining sound disappears and the police cannot do anything about it.  Patient says she feels nervous and anxious all the time.  Her nerves have been been getting worse recently.  She is not sleeping well at night which she blames on the suppose it noise.  She also admits that she  is not eating well.  Initially says this is because she does not have the energy to cook anymore but when asked if she would eat if other people prepared her food she admits that she gets a little nervous and paranoid about that as well.  Patient denies any suicidal thoughts intent or plan.  Denies homicidal ideation.  He was pleasant and cooperative and fluent throughout the interview.  Does seem a little bit slow.  Made a couple of mistakes on memory and cognitive testing but does not seem to be severely demented.  Denies alcohol or drug use.  She has been prescribed Seroquel by her primary care doctor 25 mg at night but it is not clear that this is been of any benefit since the problems are still going on since it was started in mid December.  Patient has a history of hypothyroidism.  Her TSH was checked in December and was found to be low.  I am not sure yet whether dosage was changed at that time.  Patient also has a history of chronic hip pain from arthritis and has had steroid tapers several times most recently in the middle of December.  Does not appear that she is on steroids currently.  Past Psychiatric History: Patient reports that nervousness runs in her family and that she is always been a nervous person.  She tells me she did see a psychiatrist or a mental health provider of some sort when she lived  in [place] years ago but was never hospitalized and does not remember ever being prescribed any medicine.  Denies any history of suicide attempts.  Looking at the old notes in the old chart it looks like primary care physician has identified some memory problems and as mentioned and treated her hallucinations with low-dose Seroquel.  Furthermore looking back farther in the chart it appears that these hallucinations and episodes of confusion have probably been going on since at least the year 2020 since there are episodes of the patient coming to the emergency room in that year with the same type of  complaints.  Risk to Self:   Risk to Others:   Prior Inpatient Therapy:   Prior Outpatient Therapy:    Past Medical History:  Past Medical History:  Diagnosis Date  . Hypertension   . Thyroid disease    History reviewed. No pertinent surgical history. Family History: No family history on file. Family Psychiatric  History: Patient says that anxiety runs in her family with her mother and grandmother both being very nervous people.  Denies any family history of suicide. Social History:  Social History   Substance and Sexual Activity  Alcohol Use Not Currently     Social History   Substance and Sexual Activity  Drug Use Not on file    Social History   Socioeconomic History  . Marital status: Married    Spouse name: Not on file  . Number of children: Not on file  . Years of education: Not on file  . Highest education level: Not on file  Occupational History  . Not on file  Tobacco Use  . Smoking status: Never Smoker  . Smokeless tobacco: Not on file  Substance and Sexual Activity  . Alcohol use: Not Currently  . Drug use: Not on file  . Sexual activity: Not on file  Other Topics Concern  . Not on file  Social History Narrative  . Not on file   Social Determinants of Health   Financial Resource Strain: Not on file  Food Insecurity: Not on file  Transportation Needs: Not on file  Physical Activity: Not on file  Stress: Not on file  Social Connections: Not on file   Additional Social History:    Allergies:  No Known Allergies  Labs:  Results for orders placed or performed during the hospital encounter of 10/27/20 (from the past 48 hour(s))  Comprehensive metabolic panel     Status: Abnormal   Collection Time: 10/27/20  7:46 AM  Result Value Ref Range   Sodium 141 135 - 145 mmol/L   Potassium 3.8 3.5 - 5.1 mmol/L   Chloride 105 98 - 111 mmol/L   CO2 28 22 - 32 mmol/L   Glucose, Bld 114 (H) 70 - 99 mg/dL    Comment: Glucose reference range applies only to  samples taken after fasting for at least 8 hours.   BUN 11 8 - 23 mg/dL   Creatinine, Ser 9.67 0.44 - 1.00 mg/dL   Calcium 9.4 8.9 - 89.3 mg/dL   Total Protein 7.4 6.5 - 8.1 g/dL   Albumin 3.9 3.5 - 5.0 g/dL   AST 15 15 - 41 U/L   ALT 10 0 - 44 U/L   Alkaline Phosphatase 48 38 - 126 U/L   Total Bilirubin 1.0 0.3 - 1.2 mg/dL   GFR, Estimated >81 >01 mL/min    Comment: (NOTE) Calculated using the CKD-EPI Creatinine Equation (2021)    Anion gap 8 5 -  15    Comment: Performed at [facility], [address] [place], [place] [address]  CBC     Status: Abnormal   Collection Time: 10/27/20  7:46 AM  Result Value Ref Range   WBC 3.9 (L) 4.0 - 10.5 K/uL   RBC 4.46 3.87 - 5.11 MIL/uL   Hemoglobin 12.7 12.0 - 15.0 g/dL   HCT 52.8 41.3 - 24.4 %   MCV 89.0 80.0 - 100.0 fL   MCH 28.5 26.0 - 34.0 pg   MCHC 32.0 30.0 - 36.0 g/dL   RDW 01.0 27.2 - 53.6 %   Platelets 166 150 - 400 K/uL   nRBC 0.0 0.0 - 0.2 %    Comment: Performed at [facility], [address] [place], [place] [address]  CBG monitoring, ED     Status: Abnormal   Collection Time: 10/27/20  8:03 AM  Result Value Ref Range   Glucose-Capillary 113 (H) 70 - 99 mg/dL    Comment: Glucose reference range applies only to samples taken after fasting for at least 8 hours.  Urinalysis, Complete w Microscopic Urine, Clean Catch     Status: Abnormal   Collection Time: 10/27/20 11:30 AM  Result Value Ref Range   Color, Urine YELLOW (A) YELLOW   APPearance CLEAR (A) CLEAR   Specific Gravity, Urine 1.011 1.005 - 1.030   pH 7.0 5.0 - 8.0   Glucose, UA NEGATIVE NEGATIVE mg/dL   Hgb urine dipstick NEGATIVE NEGATIVE   Bilirubin Urine NEGATIVE NEGATIVE   Ketones, ur NEGATIVE NEGATIVE mg/dL   Protein, ur NEGATIVE NEGATIVE mg/dL   Nitrite NEGATIVE NEGATIVE   Leukocytes,Ua NEGATIVE NEGATIVE   RBC / HPF 0-5 0 - 5 RBC/hpf   WBC, UA 0-5 0 - 5 WBC/hpf   Bacteria, UA NONE SEEN NONE SEEN   Squamous Epithelial / LPF 0-5 0 -  5   Mucus PRESENT     Comment: Performed at [facility], [address], [place], [place] [address]    Current Facility-Administered Medications  Medication Dose Route Frequency Provider Last Rate Last Admin  . amLODipine (NORVASC) tablet 10 mg  10 mg Oral Daily Azia Toutant T, MD      . atenolol (TENORMIN) tablet 50 mg  50 mg Oral Daily Tajia Szeliga T, MD      . Melene Muller ON 10/28/2020] levothyroxine (SYNTHROID) tablet 50 mcg  50 mcg Oral Q0600 Lamiya Naas T, MD      . pantoprazole (PROTONIX) EC tablet 40 mg  40 mg Oral Daily Cutberto Winfree T, MD      . QUEtiapine (SEROQUEL) tablet 25 mg  25 mg Oral QHS Kenlei Safi, Jackquline Denmark, MD       No current outpatient medications on file.    Musculoskeletal: Strength & Muscle Tone: decreased Gait & Station: Did not test her.  She tells me that she does get dizzy at times Patient leans: N/A  Psychiatric Specialty Exam: Physical Exam Vitals and nursing note reviewed.  Constitutional:      Appearance: She is well-developed and well-nourished.  HENT:     Head: Normocephalic and atraumatic.  Eyes:     Conjunctiva/sclera: Conjunctivae normal.     Pupils: Pupils are equal, round, and reactive to light.  Cardiovascular:     Heart sounds: Normal heart sounds.  Pulmonary:     Effort: Pulmonary effort is normal.  Abdominal:     Palpations: Abdomen is soft.  Musculoskeletal:        General:  Normal range of motion.     Cervical back: Normal range of motion.  Skin:    General: Skin is warm and dry.  Neurological:     General: No focal deficit present.     Mental Status: She is alert.  Psychiatric:        Attention and Perception: Attention normal.        Mood and Affect: Mood is anxious.        Speech: Speech normal.        Behavior: Behavior is not agitated, aggressive or hyperactive.        Thought Content: Thought content does not include homicidal or suicidal ideation.        Cognition and Memory: Cognition is impaired. Memory is  impaired.        Judgment: Judgment normal.     Review of Systems  Constitutional: Negative.   HENT: Negative.   Eyes: Negative.   Respiratory: Negative.   Cardiovascular: Negative.   Gastrointestinal: Negative.   Musculoskeletal: Negative.   Skin: Negative.   Neurological: Negative.   Psychiatric/Behavioral: Positive for dysphoric mood, hallucinations and sleep disturbance. The patient is nervous/anxious.     Blood pressure (!) 177/88, pulse 86, temperature 98.3 F (36.8 C), temperature source Oral, resp. rate 20, height 5\' 5"  (1.651 m), weight 59 kg, SpO2 100 %.Body mass index is 21.64 kg/m.  General Appearance: Casual  Eye Contact:  Fair  Speech:  Clear and Coherent  Volume:  Normal  Mood:  Anxious  Affect:  Constricted  Thought Process:  Coherent  Orientation:  Other:  Slightly confused.  Told me she thought she was in [facility] not the emergency room.  Told me the year was 1920.  Thought Content:  Hallucinations: Auditory  Suicidal Thoughts:  No  Homicidal Thoughts:  No  Memory:  Immediate;   Fair Recent;   Tested 3 words at 3 minutes.  Initially she got confused but ultimately after several tries could remember 2 of them. Remote;   Fair  Judgement:  Impaired  Insight:  Shallow  Psychomotor Activity:  Normal  Concentration:  Concentration: Fair  Recall:  [employer] of Knowledge:  Fair  Language:  Fair  Akathisia:  No  Handed:  Right  AIMS (if indicated):     Assets:  Desire for Improvement Housing Resilience Social Support  ADL's:  Impaired  Cognition:  Impaired,  Mild  Sleep:        Treatment Plan Summary: Medication management and Plan This is a 87 year old woman who is been having what sounds like intermittent episodes of confusion with auditory and visual hallucinations for a couple of years.  Recently it seems they may have become more persistent or that she may have had more episodes of calling the police or running out of her house as result.   Patient endorses multiple symptoms consistent with major depression at a moderate severity and also endorses multiple chronic anxiety problems.  She has evidence of a mild degree of memory impairment.  It would take formal testing to determine whether this meets the criteria for dementia or is memory impairment not quite at that level but I think it is probably fair to say she has a mild degree of dementia.  Patient is not suicidal and not violent.  Case reviewed with emergency room physician.  I gave the patient the option of considering a geriatric psychiatry unit.  I explained that this was an inpatient hospital design for nerve problems for  elderly people.  I made it clear that I could not guarantee how quickly this would happen or exactly where in the state it would be.  Patient says that she does not think this would be necessary and is not what she wants which I agree with.  On the other hand she says that she feels uncomfortable going home by herself.  Sounds like this problem has been getting more persistent and is going to continue.  Given that she is already on a little bit of Seroquel there are a couple things that could be done with medication.  We could increase the dose of Seroquel or change to a different antipsychotic but I think what I would suggest is starting a low-dose SSRI for depression and anxiety.  I explained to the patient that this would not work instantly but may provide some lasting benefit soon.  I think the patient would feel most comfortable going back home if some of her children or other family were involved.  I tried calling the telephone number for her daughter but no one answered the phone.  Reviewed this with the ER physician.  They will have staff in the ER work on safe disposition issues.  I am going to go ahead and prescribe 5 mg of Lexapro at this time which the patient was agreeable to.  I also have put in an order to recheck her TSH.  If she still is running abnormal  thyroid that could potentially be one factor contributing to this.  If she is on steroids still that should probably be minimized but I do not have any evidence that that is the case right now.  If she continues to be in the emergency room and you need further psychiatric input please contact us for follow-up.  Disposition: No evidence of imminent risk to self or others at present.   Patient does not meet criteria for psychiatric inpatient admission. Supportive therapy provided about ongoing stressors.  Mordecai RasmussenJohn Emonte Dieujuste, MD 10/27/2020 3:15 PM

## 2020-10-27 NOTE — ED Notes (Signed)
TSS at bedside via webcam

## 2020-10-28 LAB — SARS CORONAVIRUS 2 BY RT PCR (HOSPITAL ORDER, PERFORMED IN ~~LOC~~ HOSPITAL LAB): SARS Coronavirus 2: NEGATIVE

## 2020-10-28 LAB — T4, FREE: Free T4: 0.7 ng/dL (ref 0.61–1.12)

## 2020-10-28 NOTE — ED Notes (Signed)
Breakfast tray given to pt 

## 2020-10-28 NOTE — ED Notes (Signed)
PT at bedside with pt.  

## 2020-10-28 NOTE — ED Notes (Signed)
Sent msg to Child psychotherapist with son Nicole Ramsey's phone number that son would like to talk to her and be informed of plan for placement.

## 2020-10-28 NOTE — BH Assessment (Addendum)
Per Dr. Toni Amend pt is psych cleared and SW consult in place.   TTS attempted to reach out to family support, pt's son Chrissie Noa [phone]) to provide an update and received no answer. TTS was also unable to leave a voicemail.  Pt is currently working with [facility] and family supports should be directed to contact [phone] for SW.

## 2020-10-28 NOTE — ED Notes (Signed)
Left VM on son William's phone with social work team number for him to call if he wishes to discuss plan for placement for pt. Number is [phone].

## 2020-10-28 NOTE — ED Notes (Signed)
Pt eating cake and drinking water. New cup of water provided.

## 2020-10-28 NOTE — Evaluation (Signed)
Physical Therapy Evaluation Patient Details Name: Nicole Ramsey MRN: [id number] DOB: 03/05/1927 Today's Date: 10/28/2020   History of Present Illness  Pt is a 87 y.o. female presenting to hospital 1/28 with auditory hallucinations, anxiety, and paranoia; concern for pt's ability to care for herself at home.  PMH includes htn, thyroid disease, and chronic hip pain.  Clinical Impression  Prior to hospital admission, pt was independent with ambulation; lives alone in 1 level home with steps to enter (B railings).  Pt oriented to person and general situation; pt reporting being at the [facility] and did not know the date.  Currently pt is modified independent with bed mobility; SBA to CGA with transfers; and CGA to SBA ambulating 160 feet (no AD); pt looking around a lot during ambulation causing altered stepping patterns at times but pt able to maintain balance; pt with 1 loss of balance when turning to L suddenly to make a turn but pt able to self correct (CGA provided for safety).  Pt would benefit from skilled PT to address noted impairments and functional limitations (see below for any additional details).  Upon hospital discharge, pt would benefit from HHPT.    Follow Up Recommendations Home health PT    Equipment Recommendations  None recommended by PT    Recommendations for Other Services       Precautions / Restrictions Precautions Precautions: Fall Restrictions Weight Bearing Restrictions: No      Mobility  Bed Mobility Overal bed mobility: Modified Independent             General bed mobility comments: Semi-supine to/from sitting without any noted difficulty    Transfers Overall transfer level: Needs assistance Equipment used: None Transfers: Sit to/from Stand Sit to Stand: Supervision;Min guard         General transfer comment: fairly strong stand and controlled descent sitting from ED stretcher  Ambulation/Gait Ambulation/Gait assistance: Min  guard;Supervision Gait Distance (Feet): 160 Feet Assistive device: None   Gait velocity: decreased (d/t pt looking around a lot)   General Gait Details: partial step through gait pattern (pt looking around a lot causing altered stepping patterns at times but pt able to maintain balance); pt with 1 loss of balance when turning to L but able to self correct (CGA provided for safety)  Stairs            Wheelchair Mobility    Modified Rankin (Stroke Patients Only)       Balance Overall balance assessment: Needs assistance Sitting-balance support: No upper extremity supported;Feet supported Sitting balance-Leahy Scale: Normal Sitting balance - Comments: steady sitting reaching outside BOS   Standing balance support: No upper extremity supported;During functional activity Standing balance-Leahy Scale: Good Standing balance comment: pt looking around a lot with ambulation causing altered stepping pattern at times but pt able to maintain balance                             Pertinent Vitals/Pain Pain Assessment: No/denies pain (pt reports no pain (including hip pain))  Vitals (HR and O2 on room air) stable and WFL throughout treatment session.    Home Living Family/patient expects to be discharged to:: Private residence Living Arrangements: Alone Available Help at Discharge: Family (pt reports her daughter is busy and can't help much) Type of Home: House Home Access: Stairs to enter Entrance Stairs-Rails: Right;Left Entrance Stairs-Number of Steps: "quite a few" (pt did not specify exactly how many steps  when asked) Home Layout: One level Home Equipment: Cane - single point      Prior Function Level of Independence: Independent         Comments: Pt reports no recent falls; likes to walk and cook     Hand Dominance        Extremity/Trunk Assessment   Upper Extremity Assessment Upper Extremity Assessment: Generalized weakness    Lower Extremity  Assessment Lower Extremity Assessment: Generalized weakness    Cervical / Trunk Assessment Cervical / Trunk Assessment: Normal  Communication   Communication: No difficulties  Cognition Arousal/Alertness: Awake/alert Behavior During Therapy:  (pt appearing anxious and looking around a lot during session) Overall Cognitive Status: No family/caregiver present to determine baseline cognitive functioning                                 General Comments: Oriented to person and situation; pt reported it was 2021 and did not know month; pt reports she is at [facility] clinic      General Comments   Nursing cleared pt for participation in physical therapy.  Pt agreeable to PT session.    Exercises     Assessment/Plan    PT Assessment Patient needs continued PT services  PT Problem List Decreased balance;Decreased mobility;Decreased strength;Decreased cognition       PT Treatment Interventions DME instruction;Gait training;Stair training;Functional mobility training;Therapeutic activities;Therapeutic exercise;Balance training;Patient/family education    PT Goals (Current goals can be found in the Care Plan section)  Acute Rehab PT Goals Patient Stated Goal: to go home PT Goal Formulation: With patient Time For Goal Achievement: 11/11/20 Potential to Achieve Goals: Good    Frequency Min 2X/week   Barriers to discharge        Co-evaluation               AM-PAC PT "6 Clicks" Mobility  Outcome Measure Help needed turning from your back to your side while in a flat bed without using bedrails?: None Help needed moving from lying on your back to sitting on the side of a flat bed without using bedrails?: None Help needed moving to and from a bed to a chair (including a wheelchair)?: A Little Help needed standing up from a chair using your arms (e.g., wheelchair or bedside chair)?: A Little Help needed to walk in hospital room?: A Little Help needed climbing 3-5  steps with a railing? : A Little 6 Click Score: 20    End of Session Equipment Utilized During Treatment: Gait belt Activity Tolerance: Patient tolerated treatment well Patient left:  (in stretcher bed with B railings up; breakfast in reach) Nurse Communication: Mobility status;Precautions PT Visit Diagnosis: Unsteadiness on feet (R26.81);Muscle weakness (generalized) (M62.81)    Time: 1610-9604 PT Time Calculation (min) (ACUTE ONLY): 21 min   Charges:   PT Evaluation $PT Eval Low Complexity: 1 Low PT Treatments $Therapeutic Activity: 8-22 mins       Hendricks Limes, PT 10/28/20, 10:50 AM

## 2020-10-28 NOTE — ED Notes (Signed)
Spoke with pt's grandson and son on phone. Pt provided phone to speak with each of them. Son's info added to chart. Son Chrissie Noa requests to be contacted by Child psychotherapist re: plan for placement. Will write to Child psychotherapist.

## 2020-10-28 NOTE — ED Notes (Signed)
Pt provided with 3 warm blankets, HOB adjusted, pt has water at bedside

## 2020-10-28 NOTE — ED Notes (Signed)
Pt alert, resting in hall bed. Lunch tray provided, pt declined.

## 2020-10-28 NOTE — ED Notes (Signed)
Pt woke to receive water and morning meds, pt asked about breakfast - diet has been ordered

## 2020-10-28 NOTE — ED Notes (Signed)
Pt has ambulated self to toilet several times. Steady on feet, no assist required

## 2020-10-29 LAB — CBG MONITORING, ED: Glucose-Capillary: 146 mg/dL — ABNORMAL HIGH (ref 70–99)

## 2020-10-29 NOTE — ED Notes (Signed)
Pt was helped to bathroom to void and walked in hallway with float nurse. Pt back in bed, resting. Pt alert.

## 2020-10-29 NOTE — ED Notes (Signed)
Pt sipping on po fluids.  

## 2020-10-29 NOTE — TOC Progression Note (Signed)
Transition of Care [facility]) - Progression Note    Patient Details  Name: Nicole Ramsey MRN: [id number] Date of Birth: 1926-12-27  Transition of Care [facility]) CM/SW [place], LCSW Phone Number: 10/29/2020, 5:00 PM  Clinical Narrative:    CSW met with patient at bedside to discussed discharge plan. Social worker explained to the patient reason for the consult is to ensure safe discharge home. Patient reported that she does not want to return home.  Patient is a 87 year old female who presented [facility] ED from home. Patient reported she lives alone. Stated that she has 3 children and has a sister that lives in [place].   Collaterals:  Arlyn Leak, daughter, [phone]. Lives in [place] but is not able to care for the patient.   Liah Morr, son, [phone].  Lives in [place]. Stated that he is not able to care for the patient. Stated that he would like his mother to be placed in an ALS.   Bethanne Mule, son, [phone].  Lives in [place].  Plans on traveling to [place]. Stated that he is going to find a facility for his mother.  Has been looking into [facility].   Plan:  [facility] Adult Protective Service placed.  SW will continue to follow this patient.    Expected Discharge Plan: Home/Self Care Barriers to Discharge: Unsafe home situation,Family Issues  Expected Discharge Plan and Services Expected Discharge Plan: Home/Self Care        Social Determinants of Health (SDOH) Interventions    Readmission Risk Interventions No flowsheet data found.

## 2020-10-29 NOTE — ED Notes (Signed)
Man calling from [phone] is requesting pt to be released to him on telephone, man declines to provide name at  This time. Awaiting daughter Cloyde Reams time to identify who this person calling from this number is.

## 2020-10-29 NOTE — ED Notes (Signed)
Float nurse assisted pt to walk to bathroom and void. Pt also walked around hallways. Pt back in bed sipping water.

## 2020-10-29 NOTE — ED Notes (Signed)
ekg obtained, provided to MD by megan, rn. Pt placed on cardiac monitor by this rn. perrl brisk 18mm.

## 2020-10-29 NOTE — ED Notes (Signed)
Provided fresh water and cake for pt. Pt drinking and eating.

## 2020-10-29 NOTE — ED Notes (Signed)
Pt counting money in envelope in purse. Advised pt to put money back in purse. Pt also has keys in purse. Pt's daughter Arlethea called from [phone]. She lives in [place]. Asked her to come sit with her mother for a little while and to retrieve cash.  Daughter states that her mother doesn't trust anybody and may not let her take the cash. She is concerned about Covid because she has diabetes. Offered to meet her out in lobby if she desires. She wants to come get keys and cash from pt and will come here this afternoon.

## 2020-10-29 NOTE — ED Notes (Signed)
Pt speaking on the phone with family.

## 2020-10-29 NOTE — ED Provider Notes (Signed)
Patient had episode of syncope while going to the bathroom. After being alerted by nursing staff patient was already awake and alert. Patient not hypoglycemic. Will plan on repeat blood work.  Lurline Idol, attending physician, personally viewed and interpreted this EKG  EKG Time: 2255 Rate: 70 Rhythm: normal sinus rhythm Axis: left axis deviation Intervals: qtc 479 QRS: RBBB ST changes: no st elevation Impression: normal Maryan Puls, MD 10/29/20 2307

## 2020-10-29 NOTE — ED Notes (Signed)
Pt refuses to allow RN to clean bed. Shoes on pt's feet. Pt has purse, jacket and clothing. Pt's son called from phone number [phone] and states he is coming to get pt and "we are doing nothing, she isn't even in a bed".

## 2020-10-29 NOTE — ED Notes (Signed)
Daughter sitting with pt

## 2020-10-29 NOTE — ED Notes (Signed)
Provided water and breakfast tray.

## 2020-10-29 NOTE — ED Notes (Signed)
Spoke with pt's daughter Nicole Ramsey, who states man calling from [phone] is a previous foster son named Nicole Ramsey who is not authorized at this time to take pt home with him. Pt's daughter states pt's son Nicole Ramsey will be to facility on 10/30/2020. Per daughter Nicole Ramsey is intoxicated at this time and are requesting pt not released to him. Call placed to [place] adult protective services to notify of Nicole Ramsey. Charge rn and first RN notified to not allow visitor Nicole Ramsey at this time.

## 2020-10-29 NOTE — Social Work (Addendum)
TOC call the patient's daughter, Brooke Bonito, [phone]. Left message.  TOC SW call the patient's son Janyla Biscoe [phone]. Ronnell Finck stated that he lives in [place]. No able to do anything "because I works at a Hospital in [place] and there is a snow storm."  Noted that he has a brother that lives in [place].   TOC SW call the patient's son Caeli Linehan [phone].

## 2020-10-29 NOTE — ED Notes (Signed)
Provided phone to pt to speak with son Casimiro Needle.

## 2020-10-29 NOTE — ED Provider Notes (Signed)
-----------------------------------------   6:36 AM on 10/29/2020 -----------------------------------------   Blood pressure (!) 188/80, pulse 65, temperature 98.3 F (36.8 C), temperature source Oral, resp. rate 18, height 1.651 m (5\' 5" ), weight 59 kg, SpO2 94 %.  The patient is calm and cooperative at this time.  She is awake, confused but redirectable, no acute distress.  Awaiting social work recommendations.   , MD 10/29/20 [phone]

## 2020-10-29 NOTE — ED Notes (Signed)
Pt swatting at air and talking to someone about her keys. Pt provided chips to eat. Pt is pleasant to interact with and appears tired but is cooperative and willing to stay in bed.

## 2020-10-29 NOTE — ED Notes (Signed)
Counted cash out with daughter Arlethia. She agreed that there was 680 dollars. Gave her keys as well. Provided her with same social worker number that had given pt's son Chrissie Noa yesterday.

## 2020-10-29 NOTE — ED Notes (Signed)
Pt throwing personal items against door of room 5 and in hallway. Items retrieved and given back to pt. Pt has purse watch, clothing in bed. Pt informed to please not throw her personal items on floor.

## 2020-10-29 NOTE — ED Notes (Signed)
Patient provided with warm blanket per request. Patient is resting in bed free from sign of distress. Breathing unlabored with symmetric chest rise and fall and speaking in full sentences. Pt denies pain or needs at this time. Bed is low and locked with side rails raised x2 and is in direct view of nurse at nurses station. RN to continue to monitor.

## 2020-10-29 NOTE — ED Notes (Addendum)
Pt up to restroom with RN assist. While pt on toilet urinating with this RN, pt had a syncopal episode that lasted approx 30 seconds. Pt diaphoretic. Pt lifted up and placed back in bed by this RN and megan, rn. Pt supine on strecher, vitals obtained, iv initiated, MD notified and over to examine pt at 2300.

## 2020-10-29 NOTE — ED Notes (Signed)
Pt denies need to urinate or have bowel movement. Pt moving around independently in bed. resps unlabored.

## 2020-10-29 NOTE — ED Notes (Signed)
Pt awake, alert, remains confused, oriented to self only. No longer diaphoretic, vss. Cardiac monitor remains in place.

## 2020-10-29 NOTE — ED Notes (Signed)
Pt gave envelope of cash and house keys to this nurse. Cash counted with second nurse: Vic Ripper., RN. Delford Field is as follows: 5 100 dollar bills 7 20 dollar bills 8 5 dollar bills Total is 680 dollars. Envelope labeled with pt sticker. Keys labeled with pt sticker. Will hand to daughter Arlethia when she arrives. Called daughter Arlethia back on her number: [phone] and informed her that this is all ready. She is on her way.

## 2020-10-29 NOTE — ED Notes (Signed)
Pt declines offer to urinate.

## 2020-10-29 NOTE — ED Notes (Signed)
Pt sitting in bed eating dinner tray.

## 2020-10-29 NOTE — ED Notes (Signed)
CSW was at bedside. She will call daughter to number listed in chart.

## 2020-10-29 NOTE — ED Notes (Signed)
Pt has purse with her in bed. Pt looking through purse and stating that registration staff, who is down the hallway, has her money. Explained to pt that this person is talking to another pt and does not have her money.  Pt resting in bed but appears hypervigilant of environment.

## 2020-10-30 ENCOUNTER — Observation Stay: Payer: Medicare Other

## 2020-10-30 DIAGNOSIS — F331 Major depressive disorder, recurrent, moderate: Secondary | ICD-10-CM | POA: Diagnosis not present

## 2020-10-30 DIAGNOSIS — R55 Syncope and collapse: Secondary | ICD-10-CM | POA: Diagnosis not present

## 2020-10-30 DIAGNOSIS — I10 Essential (primary) hypertension: Secondary | ICD-10-CM | POA: Diagnosis not present

## 2020-10-30 DIAGNOSIS — F32A Depression, unspecified: Secondary | ICD-10-CM

## 2020-10-30 DIAGNOSIS — F419 Anxiety disorder, unspecified: Secondary | ICD-10-CM | POA: Diagnosis not present

## 2020-10-30 DIAGNOSIS — E039 Hypothyroidism, unspecified: Secondary | ICD-10-CM | POA: Diagnosis not present

## 2020-10-30 LAB — CBC WITH DIFFERENTIAL/PLATELET
Abs Immature Granulocytes: 0.08 10*3/uL — ABNORMAL HIGH (ref 0.00–0.07)
Basophils Absolute: 0.1 10*3/uL (ref 0.0–0.1)
Basophils Relative: 1 %
Eosinophils Absolute: 0.1 10*3/uL (ref 0.0–0.5)
Eosinophils Relative: 1 %
HCT: 44.7 % (ref 36.0–46.0)
Hemoglobin: 14.9 g/dL (ref 12.0–15.0)
Immature Granulocytes: 1 %
Lymphocytes Relative: 29 %
Lymphs Abs: 2.8 10*3/uL (ref 0.7–4.0)
MCH: 28.8 pg (ref 26.0–34.0)
MCHC: 33.3 g/dL (ref 30.0–36.0)
MCV: 86.3 fL (ref 80.0–100.0)
Monocytes Absolute: 0.7 10*3/uL (ref 0.1–1.0)
Monocytes Relative: 7 %
Neutro Abs: 6.1 10*3/uL (ref 1.7–7.7)
Neutrophils Relative %: 61 %
Platelets: 247 10*3/uL (ref 150–400)
RBC: 5.18 MIL/uL — ABNORMAL HIGH (ref 3.87–5.11)
RDW: 14.8 % (ref 11.5–15.5)
WBC: 9.8 10*3/uL (ref 4.0–10.5)
nRBC: 0.2 % (ref 0.0–0.2)

## 2020-10-30 LAB — BASIC METABOLIC PANEL
Anion gap: 14 (ref 5–15)
BUN: 14 mg/dL (ref 8–23)
CO2: 20 mmol/L — ABNORMAL LOW (ref 22–32)
Calcium: 9.5 mg/dL (ref 8.9–10.3)
Chloride: 103 mmol/L (ref 98–111)
Creatinine, Ser: 0.85 mg/dL (ref 0.44–1.00)
GFR, Estimated: >60 mL/min (ref >60)
Glucose, Bld: 129 mg/dL — ABNORMAL HIGH (ref 70–99)
Potassium: 3.8 mmol/L (ref 3.5–5.1)
Sodium: 137 mmol/L (ref 135–145)

## 2020-10-30 LAB — TROPONIN I (HIGH SENSITIVITY)
Troponin I (High Sensitivity): 7 ng/L (ref <18)
Troponin I (High Sensitivity): 14 ng/L (ref <18)

## 2020-10-30 MED ORDER — ACETAMINOPHEN 650 MG RE SUPP: 650.0000 mg | Freq: Four times a day (QID) | RECTAL | Status: DC | PRN

## 2020-10-30 MED ORDER — SODIUM CHLORIDE 0.9% FLUSH
3.0000 mL | Freq: Two times a day (BID) | INTRAVENOUS | Status: DC
  Administered 2020-10-30 – 2020-11-10 (×22): 3 mL via INTRAVENOUS

## 2020-10-30 MED ORDER — ALUM & MAG HYDROXIDE-SIMETH 200-200-20 MG/5ML PO SUSP: 30.0000 mL | Freq: Four times a day (QID) | ORAL | Status: DC | PRN

## 2020-10-30 MED ORDER — SODIUM CHLORIDE 0.9 % IV SOLN: INTRAVENOUS | Status: DC

## 2020-10-30 MED ORDER — TRAZODONE HCL 50 MG PO TABS
25.0000 mg | ORAL_TABLET | Freq: Every evening | ORAL | Status: DC | PRN
  Administered 2020-11-03 – 2020-11-07 (×3): 25 mg via ORAL
  Filled 2020-10-30 (×4): qty 1

## 2020-10-30 MED ORDER — ONDANSETRON HCL 4 MG PO TABS
4.0000 mg | ORAL_TABLET | Freq: Four times a day (QID) | ORAL | Status: DC | PRN
  Administered 2020-11-06: 4 mg via ORAL
  Filled 2020-10-30: qty 1

## 2020-10-30 MED ORDER — ENOXAPARIN SODIUM 40 MG/0.4ML ~~LOC~~ SOLN
40.0000 mg | SUBCUTANEOUS | Status: DC
  Administered 2020-10-31 – 2020-11-10 (×11): 40 mg via SUBCUTANEOUS
  Filled 2020-10-30 (×12): qty 0.4

## 2020-10-30 MED ORDER — HALOPERIDOL LACTATE 5 MG/ML IJ SOLN
2.5000 mg | Freq: Once | INTRAMUSCULAR | Status: AC
  Administered 2020-10-30: 2.5 mg via INTRAVENOUS
  Filled 2020-10-30: qty 1

## 2020-10-30 MED ORDER — LEVOTHYROXINE SODIUM 50 MCG PO TABS
75.0000 ug | ORAL_TABLET | Freq: Every day | ORAL | Status: DC
  Administered 2020-10-31 – 2020-11-10 (×11): 75 ug via ORAL
  Filled 2020-10-30 (×11): qty 2

## 2020-10-30 MED ORDER — MAGNESIUM HYDROXIDE 400 MG/5ML PO SUSP
30.0000 mL | Freq: Every day | ORAL | Status: DC | PRN
  Administered 2020-11-01 – 2020-11-09 (×2): 30 mL via ORAL
  Filled 2020-10-30 (×2): qty 30

## 2020-10-30 MED ORDER — ONDANSETRON HCL 4 MG/2ML IJ SOLN
4.0000 mg | Freq: Four times a day (QID) | INTRAMUSCULAR | Status: DC | PRN
  Filled 2020-10-30: qty 2

## 2020-10-30 MED ORDER — ACETAMINOPHEN 325 MG PO TABS
650.0000 mg | ORAL_TABLET | Freq: Four times a day (QID) | ORAL | Status: DC | PRN
  Filled 2020-10-30: qty 2

## 2020-10-30 NOTE — ED Notes (Signed)
Pt resting peacefully in bed. Will attempt to give mediations. Pt ate 75% of breakfast tray.

## 2020-10-30 NOTE — ED Notes (Signed)
Pt given breakfast.

## 2020-10-30 NOTE — ED Notes (Signed)
Attempt to obtain blood pressure, pt begins yelling again and begins to get agitated again with arms, will wait.

## 2020-10-30 NOTE — ED Notes (Signed)
Pt awake, denies needs right now. Head of bed adjusted for comfort.

## 2020-10-30 NOTE — ED Notes (Signed)
Pt with intermittent periods of yelling out "oh lord help me, stop this noise, I never done nothin to anybody".

## 2020-10-30 NOTE — ED Notes (Signed)
Patient resting comfortably at this time. Patient given warm blanket. No other needs expressed. Will continue to monitor.
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
## 2020-10-30 NOTE — Care Management CC44 (Signed)
Condition Code 44 Documentation Completed  Patient Details  Name: Nicole Ramsey MRN: [id number] Date of Birth: 1927/01/11   Condition Code 44 given:  Yes Patient signature on Condition Code 44 notice:  Yes Documentation of 2 MD's agreement:  Yes Code 44 added to claim:  Yes    Joseph Art, LCSWA 10/30/2020, 2:44 PM

## 2020-10-30 NOTE — ED Provider Notes (Signed)
-----------------------------------------   1:21 AM on 10/30/2020 -----------------------------------------  Patient increasingly agitated, getting out of bed, unable to be verbally redirected.  Appears to be sundowning.  Reviewed EKG which demonstrates normal QTC.  Will administer low-dose IV Haldol for calming.   Irean Hong, MD 10/30/20 [id number]

## 2020-10-30 NOTE — ED Notes (Signed)
Pt declines offer for po fluids or snack. Pt calmer, covered with warm blankets, soft mitten restraints still in place for pt safety to protect pt's hands from injury if strikes objects or staff again. Cardiac monitor leads replaced. resps unlabored.

## 2020-10-30 NOTE — ED Notes (Signed)
Report to ally yow, rn.  

## 2020-10-30 NOTE — ED Notes (Signed)
Two finger widths obtainable beneath soft mittens. When touched, pt begins to yell, "stop the noise, lord help me". HR 80 to radial palpation, attempt to attach blood pressure cuff, however pt becomes agitated with b/p. resps unlabored.
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
## 2020-10-30 NOTE — ED Notes (Signed)
Will need to obtain orthostatic vital signs when pt more alert.

## 2020-10-30 NOTE — ED Notes (Addendum)
Pt awake, looking around when stimulated by RN starting IVF, asked pt if she would take her synthroid, pt declines. Pt informed will need to check blood pressure with her standing and sitting up. Pt states "don't you do that to me, I didn't do nothing to you, why you want to treat me that way". Attempted to explain process to pt.

## 2020-10-30 NOTE — ED Notes (Signed)
Attempted to call pt's daughter to update on plan of care including admission, however phone goes to voicemail.
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
## 2020-10-30 NOTE — ED Notes (Signed)
Pt tearful, crying states she does not like all the noise and wants family to come and get  Her. Pt yelling "oh lord, some body help me"

## 2020-10-30 NOTE — Care Management Obs Status (Signed)
MEDICARE OBSERVATION STATUS NOTIFICATION   Patient Details  Name: MEDINA DEGRAFFENREID MRN: [id number] Date of Birth: 11-28-1926   Medicare Observation Status Notification Given:  Yes    Joseph Art, LCSWA 10/30/2020, 2:44 PM

## 2020-10-30 NOTE — TOC Progression Note (Signed)
Transition of Care [facility]) - Progression Note    Patient Details  Name: MARSHEA WISHER MRN: [id number] Date of Birth: 1927/08/17  Transition of [facility] CM/SW Contact  Marina Goodell Phone Number: [phone] 10/30/2020, 2:20 PM  Clinical Narrative:     CSW spoke with patient's son Alaynah Schutter and Embree Brawley [phone], who is visiting from [place].  Mr. Lippold would like to confirm if the patient has a diagnosis of dementia or alzheimer's.  Mr. Sessler stated he does not feel the patient is safe to live on her own anymore.  Mr.  Mikrut stated he was told by an RN who looked at the patient's scan that the patient had dementia.  CSW stated the patient would need to speak with the Attending to confirm the diagnosis.  CSW stated I would give the Attending Mr. Thackeray' contact information ans request he call him.   Ms. Christinamarie Tall stated she spoke with Angelica Wash from [facility] who requested this CSW send an FL2 to assist with ALF/Memory Care placement, fax [phone].  CSW stated I would try to get the FL2 to Ms. Wash either today or tomorrow.  Ms. Mumme verbalized understanding.  Expected Discharge Plan: Home/Self Care Barriers to Discharge: Unsafe home situation,Family Issues  Expected Discharge Plan and Services Expected Discharge Plan: Home/Self Care                                               Social Determinants of Health (SDOH) Interventions    Readmission Risk Interventions No flowsheet data found.

## 2020-10-30 NOTE — Consult Note (Signed)
[facility] Face-to-Face Psychiatry Consult   Reason for Consult: Consult follow-up for 87 year old woman with symptoms of depression and hallucinations and confusion at home Referring Physician:  [facility] Patient Identification: Nicole Ramsey MRN:  [id number] Principal Diagnosis: Depression, major, recurrent, moderate (HCC) Diagnosis:  Principal Problem:   Depression, major, recurrent, moderate (HCC) Active Problems:   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 (HCC)   Hypothyroid   Hypertension   Syncope   Total Time spent with patient: 30 minutes  Subjective:   Nicole Ramsey is a 87 y.o. female patient admitted with "I suppose I am all right".  HPI: Followed up with patient today.  She has been in the emergency room since Friday.  It looks like she has been admitted to the medical service although she at the moment continues to be in the emergency room.  Patient has no new complaints.  Denies suicidal thoughts.  States that since she has been in the ER she has not been seeing or hearing anything that has been frightening or concerning.  Continues to repeat the history as previously that she was seeing things and hearing things at home.  Patient's son and daughter-in-law called today wanting an update on diagnosis and treatment plan.  Past Psychiatric History: Patient has had identified instances of this sort of behavior going back a couple of years with more concern recently about her potential for accidentally harming herself or putting herself in a dangerous situation because of her confusion  Risk to Self:   Risk to Others:   Prior Inpatient Therapy:   Prior Outpatient Therapy:    Past Medical History:  Past Medical History:  Diagnosis Date  . Hypertension   . Thyroid disease    History reviewed. No pertinent surgical history. Family History: No family history on file. Family Psychiatric  History: See previous Social History:  Social History   Substance and  Sexual Activity  Alcohol Use Not Currently     Social History   Substance and Sexual Activity  Drug Use Not on file    Social History   Socioeconomic History  . Marital status: Married    Spouse name: Not on file  . Number of children: Not on file  . Years of education: Not on file  . Highest education level: Not on file  Occupational History  . Not on file  Tobacco Use  . Smoking status: Never Smoker  . Smokeless tobacco: Not on file  Substance and Sexual Activity  . Alcohol use: Not Currently  . Drug use: Not on file  . Sexual activity: Not on file  Other Topics Concern  . Not on file  Social History Narrative  . Not on file   Social Determinants of Health   Financial Resource Strain: Not on file  Food Insecurity: Not on file  Transportation Needs: Not on file  Physical Activity: Not on file  Stress: Not on file  Social Connections: Not on file   Additional Social History:    Allergies:   Allergies  Allergen Reactions  . Aspirin Other (See Comments)    Elevated blood pressure    Labs:  Results for orders placed or performed during the hospital encounter of 10/27/20 (from the past 48 hour(s))  CBC with Differential     Status: Abnormal   Collection Time: 10/29/20 11:00 PM  Result Value Ref Range   WBC 9.8 4.0 - 10.5 K/uL   RBC 5.18 (H) 3.87 - 5.11 MIL/uL   Hemoglobin 14.9  12.0 - 15.0 g/dL   HCT 56.8 12.7 - 51.7 %   MCV 86.3 80.0 - 100.0 fL   MCH 28.8 26.0 - 34.0 pg   MCHC 33.3 30.0 - 36.0 g/dL   RDW 00.1 74.9 - 44.9 %   Platelets 247 150 - 400 K/uL   nRBC 0.2 0.0 - 0.2 %   Neutrophils Relative % 61 %   Neutro Abs 6.1 1.7 - 7.7 K/uL   Lymphocytes Relative 29 %   Lymphs Abs 2.8 0.7 - 4.0 K/uL   Monocytes Relative 7 %   Monocytes Absolute 0.7 0.1 - 1.0 K/uL   Eosinophils Relative 1 %   Eosinophils Absolute 0.1 0.0 - 0.5 K/uL   Basophils Relative 1 %   Basophils Absolute 0.1 0.0 - 0.1 K/uL   Immature Granulocytes 1 %   Abs Immature Granulocytes  0.08 (H) 0.00 - 0.07 K/uL    Comment: Performed at [facility], [address], [place], [place] [address]  Basic metabolic panel     Status: Abnormal   Collection Time: 10/29/20 11:00 PM  Result Value Ref Range   Sodium 137 135 - 145 mmol/L   Potassium 3.8 3.5 - 5.1 mmol/L    Comment: HEMOLYSIS AT THIS LEVEL MAY AFFECT RESULT   Chloride 103 98 - 111 mmol/L   CO2 20 (L) 22 - 32 mmol/L   Glucose, Bld 129 (H) 70 - 99 mg/dL    Comment: Glucose reference range applies only to samples taken after fasting for at least 8 hours.   BUN 14 8 - 23 mg/dL   Creatinine, Ser 6.38 0.44 - 1.00 mg/dL   Calcium 9.5 8.9 - 46.6 mg/dL   GFR, Estimated >59 >93 mL/min    Comment: (NOTE) Calculated using the CKD-EPI Creatinine Equation (2021)    Anion gap 14 5 - 15    Comment: Performed at [facility], [address], [place], [place] [address]  Troponin I (High Sensitivity)     Status: None   Collection Time: 10/29/20 11:00 PM  Result Value Ref Range   Troponin I (High Sensitivity) 7 <18 ng/L    Comment: (NOTE) Elevated high sensitivity troponin I (hsTnI) values and significant  changes across serial measurements may suggest ACS but many other  chronic and acute conditions are known to elevate hsTnI results.  Refer to the "Links" section for chest pain algorithms and additional  guidance. Performed at [facility], [address] [place], [place] [address]   CBG monitoring, ED     Status: Abnormal   Collection Time: 10/29/20 11:03 PM  Result Value Ref Range   Glucose-Capillary 146 (H) 70 - 99 mg/dL    Comment: Glucose reference range applies only to samples taken after fasting for at least 8 hours.  Troponin I (High Sensitivity)     Status: None   Collection Time: 10/30/20  3:00 AM  Result Value Ref Range   Troponin I (High Sensitivity) 14 <18 ng/L    Comment: (NOTE) Elevated high sensitivity troponin I (hsTnI) values and significant  changes across serial  measurements may suggest ACS but many other  chronic and acute conditions are known to elevate hsTnI results.  Refer to the "Links" section for chest pain algorithms and additional  guidance. Performed at [facility], [address], [place], [place] [address]     Current Facility-Administered Medications  Medication Dose Route Frequency Provider Last Rate Last Admin  . 0.9 %  sodium chloride infusion  Intravenous Continuous Mansy, Jan A, MD 100 mL/hr at 10/30/20 1524 Infusion Verify at 10/30/20 1524  . acetaminophen (TYLENOL) tablet 650 mg  650 mg Oral Q6H PRN Mansy, Jan A, MD       Or  . acetaminophen (TYLENOL) suppository 650 mg  650 mg Rectal Q6H PRN Mansy, Jan A, MD      . alum & mag hydroxide-simeth (MAALOX/MYLANTA) 200-200-20 MG/5ML suspension 30 mL  30 mL Oral Q6H PRN Mansy, Jan A, MD      . amLODipine (NORVASC) tablet 10 mg  10 mg Oral Daily Jozy Mcphearson T, MD   10 mg at 10/30/20 1020  . atenolol (TENORMIN) tablet 50 mg  50 mg Oral Daily Maylyn Narvaiz T, MD   50 mg at 10/30/20 1021  . enoxaparin (LOVENOX) injection 40 mg  40 mg Subcutaneous Q24H Mansy, Jan A, MD      . escitalopram (LEXAPRO) tablet 5 mg  5 mg Oral Daily Neeraj Housand T, MD   5 mg at 10/30/20 1021  . [START ON 10/31/2020] levothyroxine (SYNTHROID) tablet 75 mcg  75 mcg Oral Q0600 Arnetha Courser, MD      . magnesium hydroxide (MILK OF MAGNESIA) suspension 30 mL  30 mL Oral Daily PRN Mansy, Jan A, MD      . ondansetron [facility]) tablet 4 mg  4 mg Oral Q6H PRN Mansy, Jan A, MD       Or  . ondansetron [facility]) injection 4 mg  4 mg Intravenous Q6H PRN Mansy, Jan A, MD      . pantoprazole (PROTONIX) EC tablet 40 mg  40 mg Oral Daily Yianni Skilling, Jackquline Denmark, MD   40 mg at 10/30/20 1021  . QUEtiapine (SEROQUEL) tablet 25 mg  25 mg Oral QHS Sandro Burgo, Jackquline Denmark, MD   25 mg at 10/28/20 2143  . sodium chloride flush (NS) 0.9 % injection 3 mL  3 mL Intravenous Q12H Mansy, Jan A, MD      . traZODone (DESYREL) tablet 25 mg  25 mg  Oral QHS PRN Mansy, Vernetta Honey, MD       Current Outpatient Medications  Medication Sig Dispense Refill  . ALPRAZolam (XANAX) 1 MG tablet Take 1 mg by mouth 2 (two) times daily as needed.    Marland Kitchen atenolol (TENORMIN) 50 MG tablet Take 50 mg by mouth daily.    Marland Kitchen levothyroxine (SYNTHROID) 50 MCG tablet Take 50 mcg by mouth daily.    . QUEtiapine (SEROQUEL) 25 MG tablet Take 25 mg by mouth at bedtime.    Marland Kitchen amLODipine (NORVASC) 10 MG tablet Take 10 mg by mouth daily.    . meloxicam (MOBIC) 15 MG tablet Take 15 mg by mouth daily.    Marland Kitchen omeprazole (PRILOSEC) 40 MG capsule Take 40 mg by mouth daily.    . Vitamin D, Ergocalciferol, (DRISDOL) 1.25 MG (50000 UNIT) CAPS capsule Take 50,000 Units by mouth once a week.      Musculoskeletal: Strength & Muscle Tone: within normal limits Gait & Station: Did not evaluate Patient leans: N/A  Psychiatric Specialty Exam: Physical Exam Vitals and nursing note reviewed.  Constitutional:      Appearance: She is well-developed and well-nourished.  HENT:     Head: Normocephalic and atraumatic.  Eyes:     Conjunctiva/sclera: Conjunctivae normal.     Pupils: Pupils are equal, round, and reactive to light.  Cardiovascular:     Heart sounds: Normal heart sounds.  Pulmonary:     Effort: Pulmonary effort is  normal.  Abdominal:     Palpations: Abdomen is soft.  Musculoskeletal:        General: Normal range of motion.     Cervical back: Normal range of motion.  Skin:    General: Skin is warm and dry.  Neurological:     General: No focal deficit present.     Mental Status: She is alert.  Psychiatric:        Attention and Perception: Attention normal.        Mood and Affect: Mood is anxious and depressed.        Speech: Speech is delayed.        Behavior: Behavior is slowed.        Thought Content: Thought content does not include homicidal or suicidal ideation.        Cognition and Memory: Cognition is impaired. Memory is impaired.     Review of Systems   Constitutional: Negative.   HENT: Negative.   Eyes: Negative.   Respiratory: Negative.   Cardiovascular: Negative.   Gastrointestinal: Negative.   Musculoskeletal: Negative.   Skin: Negative.   Neurological: Negative.   Psychiatric/Behavioral: Positive for dysphoric mood. Negative for suicidal ideas.    Blood pressure 126/65, pulse 64, temperature 98.6 F (37 C), temperature source Oral, resp. rate 18, height 5\' 5"  (1.651 m), weight 59 kg, SpO2 96 %.Body mass index is 21.64 kg/m.  General Appearance: Casual  Eye Contact:  Fair  Speech:  Slow  Volume:  Decreased  Mood:  Euthymic  Affect:  Constricted  Thought Process:  Coherent  Orientation:  NA  Thought Content:  Tangential  Suicidal Thoughts:  No  Homicidal Thoughts:  No  Memory:  Immediate;   Fair Recent;   Fair Remote;   Fair  Judgement:  Impaired  Insight:  Shallow  Psychomotor Activity:  Decreased  Concentration:  Concentration: Poor  Recall:  Poor  Fund of Knowledge:  Good  Language:  Good  Akathisia:  No  Handed:  Right  AIMS (if indicated):     Assets:  Desire for Improvement Resilience Social Support  ADL's:  Impaired  Cognition:  Impaired,  Mild  Sleep:        Treatment Plan Summary: Medication management and Plan Spoke with the son and daughter-in-law.  Advised him that I felt that the patient probably had both depression and mild dementia.  I agreed with them that going back to living independently in her current condition did not seem like a safe plan.  We talked about disposition options including the possibility of a geriatric psychiatry unit.  I explained that that could be one option that would be helpful and I would discuss it with the treatment team.  Explained that geriatric psychiatry units are comparatively rare and an admission there would be temporary in any case.  It appears they are also looking into assisted living or memory care which I think would be appropriate to this patient's mild  dementia.  I am not changing her medication as we just started low-dose Lexapro and she is not having any new or worsening complaints.  We will continue to follow-up if I can continue to be helpful.  Disposition: See note above.  Continue current medication and monitoring for ultimate treatment plan  Mordecai Rasmussen, MD 10/30/2020 5:18 PM

## 2020-10-30 NOTE — ED Notes (Signed)
US at bedside

## 2020-10-30 NOTE — ED Notes (Signed)
Meal tray delivered at this time. 

## 2020-10-30 NOTE — ED Notes (Signed)
IV with flush but does not have blood return. Pt will not allow RN to attempt venipuncture for repeat troponin.

## 2020-10-30 NOTE — ED Notes (Addendum)
Pt states she has not had any food in weeks. Pt informed that she has been receiving meals and snacks while here. Pt requesting applesauce. Pt provided with applesauce and water. Pt immediately put applesauce up on counter and did not consume any.

## 2020-10-30 NOTE — ED Notes (Signed)
Pt resting.

## 2020-10-30 NOTE — ED Notes (Signed)
Pt resting, resps unlabored.  

## 2020-10-31 ENCOUNTER — Observation Stay (HOSPITAL_BASED_OUTPATIENT_CLINIC_OR_DEPARTMENT_OTHER)
Admit: 2020-10-31 | Discharge: 2020-10-31 | Disposition: A | Discharge status: Home / Self Care | Payer: Medicare Other | Attending: Family Medicine | Admitting: Family Medicine

## 2020-10-31 DIAGNOSIS — I340 Nonrheumatic mitral (valve) insufficiency: Secondary | ICD-10-CM

## 2020-10-31 DIAGNOSIS — F331 Major depressive disorder, recurrent, moderate: Secondary | ICD-10-CM | POA: Diagnosis not present

## 2020-10-31 DIAGNOSIS — I361 Nonrheumatic tricuspid (valve) insufficiency: Secondary | ICD-10-CM | POA: Diagnosis not present

## 2020-10-31 DIAGNOSIS — R55 Syncope and collapse: Secondary | ICD-10-CM | POA: Diagnosis not present

## 2020-10-31 LAB — ECHOCARDIOGRAM COMPLETE
AR max vel: 2.84 cm2
AV Area VTI: 3.1 cm2
AV Area mean vel: 2.65 cm2
AV Mean grad: 3 mmHg
AV Peak grad: 5.1 mmHg
Ao pk vel: 1.13 m/s
Area-P 1/2: 1.52 cm2
Height: 65 in
S' Lateral: 2.3 cm
Weight: 1968.27 [oz_av]

## 2020-10-31 LAB — CBC
HCT: 39.2 % (ref 36.0–46.0)
Hemoglobin: 13.2 g/dL (ref 12.0–15.0)
MCH: 28.8 pg (ref 26.0–34.0)
MCHC: 33.7 g/dL (ref 30.0–36.0)
MCV: 85.4 fL (ref 80.0–100.0)
Platelets: 207 10*3/uL (ref 150–400)
RBC: 4.59 MIL/uL (ref 3.87–5.11)
RDW: 15.1 % (ref 11.5–15.5)
WBC: 9.7 10*3/uL (ref 4.0–10.5)
nRBC: 0 % (ref 0.0–0.2)

## 2020-10-31 LAB — BASIC METABOLIC PANEL
Anion gap: 10 (ref 5–15)
BUN: 19 mg/dL (ref 8–23)
CO2: 25 mmol/L (ref 22–32)
Calcium: 9 mg/dL (ref 8.9–10.3)
Chloride: 100 mmol/L (ref 98–111)
Creatinine, Ser: 1.02 mg/dL — ABNORMAL HIGH (ref 0.44–1.00)
GFR, Estimated: 52 mL/min — ABNORMAL LOW (ref >60)
Glucose, Bld: 113 mg/dL — ABNORMAL HIGH (ref 70–99)
Potassium: 3.5 mmol/L (ref 3.5–5.1)
Sodium: 135 mmol/L (ref 135–145)

## 2020-10-31 LAB — GLUCOSE, CAPILLARY: Glucose-Capillary: 118 mg/dL — ABNORMAL HIGH (ref 70–99)

## 2020-10-31 NOTE — NC FL2 (Signed)
[place] MEDICAID FL2 LEVEL OF CARE SCREENING TOOL     IDENTIFICATION  Patient Name: Nicole Ramsey Birthdate: 1927/03/05 Sex: female Admission Date (Current Location): 10/27/2020  [place] and [place] Number:  [address] [id number] Facility and Address:  [facility], [address] [place], [place] [address]      Provider Number: [id number]  Attending Physician Name and Address:  Arnetha Courser, MD  Relative Name and Phone Number:  Ahna Konkle [facility]), [phone]    Current Level of Care: Hospital Recommended Level of Care: Memory Care Prior Approval Number:    Date Approved/Denied:   PASRR Number: Pasrr pending  Discharge Plan: Other (Comment) (Memory Care)    Current Diagnoses: Patient Active Problem List   Diagnosis Date Noted  . Syncope 10/30/2020  . Depression, major, recurrent, moderate (HCC) 10/27/2020  . 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 (HCC) 10/27/2020  . Hypothyroid 10/27/2020  . Hypertension 10/27/2020    Orientation RESPIRATION BLADDER Height & Weight     Self,Place  Normal Continent Weight: 123 lb 0.3 oz (55.8 kg) Height:  5\' 5"  (165.1 cm)  BEHAVIORAL SYMPTOMS/MOOD NEUROLOGICAL BOWEL NUTRITION STATUS      Continent Diet  AMBULATORY STATUS COMMUNICATION OF NEEDS Skin   Limited Assist Verbally Normal                       Personal Care Assistance Level of Assistance  Bathing,Feeding,Dressing,Total care Bathing Assistance: Limited assistance Feeding assistance: Limited assistance Dressing Assistance: Limited assistance Total Care Assistance: Limited assistance   Functional Limitations Info  Sight,Hearing,Speech Sight Info: Adequate Hearing Info: Adequate Speech Info: Adequate    SPECIAL CARE FACTORS FREQUENCY                       Contractures Contractures Info: Not present    Additional Factors Info                  Current Medications (10/31/2020):  This is the  current hospital active medication list Current Facility-Administered Medications  Medication Dose Route Frequency Provider Last Rate Last Admin  . acetaminophen (TYLENOL) tablet 650 mg  650 mg Oral Q6H PRN Mansy, Jan A, MD       Or  . acetaminophen (TYLENOL) suppository 650 mg  650 mg Rectal Q6H PRN Mansy, Jan A, MD      . alum & mag hydroxide-simeth (MAALOX/MYLANTA) 200-200-20 MG/5ML suspension 30 mL  30 mL Oral Q6H PRN Mansy, Jan A, MD      . amLODipine (NORVASC) tablet 10 mg  10 mg Oral Daily Clapacs, Feb, MD   10 mg at 10/31/20 0905  . atenolol (TENORMIN) tablet 50 mg  50 mg Oral Daily Clapacs, 12/29/20, MD   50 mg at 10/31/20 0906  . enoxaparin (LOVENOX) injection 40 mg  40 mg Subcutaneous Q24H Mansy, Jan A, MD   40 mg at 10/31/20 0906  . escitalopram (LEXAPRO) tablet 5 mg  5 mg Oral Daily Clapacs, 0907, MD   5 mg at 10/31/20 0905  . levothyroxine (SYNTHROID) tablet 75 mcg  75 mcg Oral Q0600 12/29/20, MD   75 mcg at 10/31/20 0518  . magnesium hydroxide (MILK OF MAGNESIA) suspension 30 mL  30 mL Oral Daily PRN Mansy, Jan A, MD      . ondansetron [facility]) tablet 4 mg  4 mg Oral Q6H PRN Mansy, [facility], MD       Or  .  ondansetron (ZOFRAN) injection 4 mg  4 mg Intravenous Q6H PRN Mansy, Jan A, MD      . pantoprazole (PROTONIX) EC tablet 40 mg  40 mg Oral Daily Clapacs, Jackquline Denmark, MD   40 mg at 10/31/20 0905  . QUEtiapine (SEROQUEL) tablet 25 mg  25 mg Oral QHS Clapacs, Jackquline Denmark, MD   25 mg at 10/30/20 2225  . sodium chloride flush (NS) 0.9 % injection 3 mL  3 mL Intravenous Q12H Mansy, Jan A, MD   3 mL at 10/31/20 0907  . traZODone (DESYREL) tablet 25 mg  25 mg Oral QHS PRN Mansy, Vernetta Honey, MD         Discharge Medications: Please see discharge summary for a list of discharge medications.  Relevant Imaging Results:  Relevant Lab Results:   Additional Information [id number]  Joseph Art, LCSWA

## 2020-10-31 NOTE — Plan of Care (Signed)
No complaints overnight. Orthostatic BP taken on admission. Safety measures in place. Will continue to monitor.  Problem: Education: Goal: Knowledge of General Education information will improve Description: Including pain rating scale, medication(s)/side effects and non-pharmacologic comfort measures Outcome: Progressing   Problem: Health Behavior/Discharge Planning: Goal: Ability to manage health-related needs will improve Outcome: Progressing   Problem: Clinical Measurements: Goal: Ability to maintain clinical measurements within normal limits will improve Outcome: Progressing Goal: Will remain free from infection Outcome: Progressing Goal: Diagnostic test results will improve Outcome: Progressing Goal: Respiratory complications will improve Outcome: Progressing Goal: Cardiovascular complication will be avoided Outcome: Progressing   Problem: Activity: Goal: Risk for activity intolerance will decrease Outcome: Progressing   Problem: Nutrition: Goal: Adequate nutrition will be maintained Outcome: Progressing   Problem: Coping: Goal: Level of anxiety will decrease Outcome: Progressing   Problem: Elimination: Goal: Will not experience complications related to bowel motility Outcome: Progressing Goal: Will not experience complications related to urinary retention Outcome: Progressing   Problem: Pain Managment: Goal: General experience of comfort will improve Outcome: Progressing   Problem: Skin Integrity: Goal: Risk for impaired skin integrity will decrease Outcome: Progressing

## 2020-10-31 NOTE — TOC Progression Note (Signed)
Transition of Care [facility]) - Progression Note    Patient Details  Name: KAYDYN CHISM MRN: [id number] Date of Birth: Jul 17, 1927  [facility] CM/SW Contact  Marina Goodell Phone Number:  [phone] 10/31/2020, 11:32 AM  Clinical Narrative:     CSW faxed FL2 to [facility] [phone].   Expected Discharge Plan: Home/Self Care Barriers to Discharge: Unsafe home situation,Family Issues  Expected Discharge Plan and Services Expected Discharge Plan: Home/Self Care                                               Social Determinants of Health (SDOH) Interventions    Readmission Risk Interventions No flowsheet data found.

## 2020-10-31 NOTE — Progress Notes (Signed)
*  PRELIMINARY RESULTS* Echocardiogram 2D Echocardiogram has been performed.  Cristela Blue 10/31/2020, 12:00 PM

## 2020-10-31 NOTE — Progress Notes (Signed)
Provider made aware pt does not have tele box. No tele box available.

## 2020-10-31 NOTE — Progress Notes (Signed)
Physical Therapy Treatment Patient Details Name: Nicole Ramsey MRN: [id number] DOB: 11/09/1926 Today's Date: 10/31/2020    History of Present Illness 87 y.o. female with a known history of essential hypertension and hypothyroidism, who presented to the emergency room with acute onset of failure to thrive with recent altered mental status with hallucination and paranoia increasing difficulty with anxiety. She was admitted under medical service for presumed syncopal work-up.    PT Comments    The pt presented with increased lethargy today. She required constant stimuli in order to attend to tasks. She also reports increased dizziness with sitting at EOB and in standing. The pt participated in bed level exercise as well as some limited gait training at the EOB.     Follow Up Recommendations  Home health PT (Pt with limited participation this session, will continue to monitor mobility for any updates in recommendations.)     Equipment Recommendations   (Reports having a RW at home.)    Recommendations for Other Services       Precautions / Restrictions Precautions Precautions: Fall    Mobility  Bed Mobility Overal bed mobility: Modified Independent             General bed mobility comments: supine<>sit with HOB semi-reclined.  Transfers Overall transfer level: Needs assistance Equipment used: 1 person hand held assist Transfers: Sit to/from Stand Sit to Stand: Min assist            Ambulation/Gait Ambulation/Gait assistance: Min assist (Gait distances highly limited d/t pt lethargy)   Assistive device: 1 person hand held assist       General Gait Details: Lateral steps at EOB, but requires assist with contralateral weightshift in order to increase step length, c/o dizziness throughout session.   Stairs             Wheelchair Mobility    Modified Rankin (Stroke Patients Only)       Balance Overall balance assessment: Needs assistance    Sitting balance-Leahy Scale: Normal                                      Cognition Arousal/Alertness: Lethargic (Pt intermittently drifting off to sleep and requiring constant stimuli to keep focus.) Behavior During Therapy: Flat affect Overall Cognitive Status: No family/caregiver present to determine baseline cognitive functioning                                 General Comments: Pt oriented to person only.      Exercises General Exercises - Lower Extremity Ankle Circles/Pumps: AROM;Right;Left;15 reps Heel Slides: Right;Left;10 reps;Supine (Pt reporting fear of pain during L knee flexion, however did not report actual pain or demonstrate grimacing.) Straight Leg Raises: AAROM;10 reps;Right;Left;Supine (Pt requiring lots of verbal cueing as she would lose focus of drift off to sleep mid-exercise.)    General Comments        Pertinent Vitals/Pain Pain Assessment: No/denies pain    Home Living                      Prior Function            PT Goals (current goals can now be found in the care plan section) Acute Rehab PT Goals Patient Stated Goal: none stated PT Goal Formulation: With patient Time For Goal Achievement:  11/11/20 Potential to Achieve Goals: Fair Progress towards PT goals: Progressing toward goals    Frequency    Min 2X/week      PT Plan      Co-evaluation              AM-PAC PT "6 Clicks" Mobility   Outcome Measure  Help needed turning from your back to your side while in a flat bed without using bedrails?: None Help needed moving from lying on your back to sitting on the side of a flat bed without using bedrails?: A Little Help needed moving to and from a bed to a chair (including a wheelchair)?: A Little Help needed standing up from a chair using your arms (e.g., wheelchair or bedside chair)?: A Little Help needed to walk in hospital room?: A Lot Help needed climbing 3-5 steps with a railing? : A  Lot 6 Click Score: 17    End of Session   Activity Tolerance: Patient limited by lethargy Patient left: in bed;with bed alarm set;with call bell/phone within reach   PT Visit Diagnosis: Unsteadiness on feet (R26.81);Muscle weakness (generalized) (M62.81)     Time: 5361-4431 PT Time Calculation (min) (ACUTE ONLY): 23 min  Charges:  $Therapeutic Activity: 23-37 mins                     3:44 PM, 10/31/20 Martise Jonathin Heinicke A. Mordecai Maes PT, DPT Physical Therapist - [facility]   Zhane Bluitt A  10/31/2020, 3:41 PM
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
## 2020-10-31 NOTE — Evaluation (Signed)
Occupational Therapy Evaluation Patient Details Name: Nicole Ramsey MRN: [id number] DOB: October 10, 1926 Today's Date: 10/31/2020    History of Present Illness 87 y.o. female with a known history of essential hypertension and hypothyroidism, who presented to the emergency room with acute onset of failure to thrive with recent altered mental status with hallucination and paranoia increasing difficulty with anxiety. She was admitted under medical service for presumed syncopal work-up.   Clinical Impression   Pt seen for OT evaluation this date. Upon arrival to room, pt in bed asleep, however easily awoken and agreeable to session. Of note, pt agreeable to session and communicative, but eyes closed for ~60% of session. Pt reports being mod-independent in all ADLs and functional mobility, living in a 1 story home alone prior to admission. Pt reports receiving assistance from family/friends for trips to grocery store and medical appointments. Pt currently requires MIN A-supervision for seated UB/LB dressing and grooming. Pt was able to participate in sit>stand transfer and take 4 side-steps toward [facility], however further mobility deferred d/t symptomatic orthostatic hypotension (see below), unresolved after seated LE therex and ADLs. Pt would benefit from additional skilled OT services to maximize independence with ADLs and functional mobility and to improve safety awareness. D/t pt's limited caregiver support, OT to recommend SNF.    Follow Up Recommendations  SNF    Equipment Recommendations  Other (comment) (defer to next venue of care)    Recommendations for Other Services       Precautions / Restrictions Precautions Precautions: Fall Restrictions Weight Bearing Restrictions: No Other Position/Activity Restrictions: Monitor orthostatics      Mobility Bed Mobility Overal bed mobility: Modified Independent                  Transfers Overall transfer level: Needs  assistance Equipment used: None Transfers: Sit to/from Stand Sit to Stand: Supervision;Min guard              Balance Overall balance assessment: Needs assistance Sitting-balance support: No upper extremity supported;Feet supported Sitting balance-Leahy Scale: Normal Sitting balance - Comments: steady sitting reaching outside BOS to don socks   Standing balance support: No upper extremity supported;During functional activity Standing balance-Leahy Scale: Good                             ADL either performed or assessed with clinical judgement   ADL Overall ADL's : Needs assistance/impaired     Grooming: Wash/dry hands;Wash/dry face;Supervision/safety;Set up;Sitting Grooming Details (indicate cue type and reason): Sitting EOB         Upper Body Dressing : Minimal assistance;Sitting Upper Body Dressing Details (indicate cue type and reason): To don/doff hospital gown; required multimodel cues for sequencing task and weight shifting to remove gown from backside Lower Body Dressing: Set up;Min guard;Sitting/lateral leans Lower Body Dressing Details (indicate cue type and reason): To don socks, pt able to bend over, with UE reaching outside of BOS with no LOB noted             Functional mobility during ADLs: Min guard General ADL Comments: Pt able to take few side steps toward [facility] with MIN GUARD and no DME, however further mobility deferred d/t unresolved orthostatic hypotension     Vision Baseline Vision/History: Wears glasses Wears Glasses: At all times              Pertinent Vitals/Pain Pain Assessment: No/denies pain     Hand Dominance  Extremity/Trunk Assessment Upper Extremity Assessment Upper Extremity Assessment: Generalized weakness   Lower Extremity Assessment Lower Extremity Assessment: Generalized weakness   Cervical / Trunk Assessment Cervical / Trunk Assessment: Normal   Communication Communication Communication: No  difficulties   Cognition Arousal/Alertness: Lethargic Behavior During Therapy: Flat affect Overall Cognitive Status: No family/caregiver present to determine baseline cognitive functioning                                 General Comments: Oriented to person and situation; pt reported it was 2021. Pt agreeable to session and communicative, but eyes closed for ~60% of session   General Comments  Orthostatics: supine 152/64, sitting 121/58 w/ pt c/o of dizziness; dizziness unresolved following seated LE therex and ADLs            Home Living Family/patient expects to be discharged to:: Private residence Living Arrangements: Alone Available Help at Discharge: Family (daughter lives nearby and helps with driving pt to medial appointments) Type of Home: House Home Access: Stairs to enter   Entrance Stairs-Rails: Right;Left Home Layout: One level     Bathroom Shower/Tub: Other (comment) (pt reports typically spongebathing sink-side)   Bathroom Toilet: Standard     Home Equipment: Cane - single point          Prior Functioning/Environment Level of Independence: Needs assistance  Gait / Transfers Assistance Needed: Pt reports using a SPC for functional mobility. ADL's / Homemaking Assistance Needed: Pt reports being independent with dressing and reports sponge-bathing at baseline.  Family/friends drive her to grocery store/medical appointments            OT Problem List: Decreased strength;Decreased activity tolerance;Impaired balance (sitting and/or standing);Decreased safety awareness      OT Treatment/Interventions: Self-care/ADL training;Therapeutic exercise;Energy conservation;DME and/or AE instruction;Therapeutic activities;Patient/family education;Balance training    OT Goals(Current goals can be found in the care plan section) Acute Rehab OT Goals Patient Stated Goal: none stated OT Goal Formulation: With patient Time For Goal Achievement:  11/14/20 Potential to Achieve Goals: Good ADL Goals Pt Will Perform Grooming: with supervision;standing Pt Will Perform Lower Body Bathing: with modified independence;sit to/from stand Pt Will Perform Toileting - Clothing Manipulation and hygiene: with supervision;sit to/from stand  OT Frequency: Min 1X/week   Barriers to D/C: Decreased caregiver support          Co-evaluation              AM-PAC OT "6 Clicks" Daily Activity     Outcome Measure Help from another person eating meals?: None Help from another person taking care of personal grooming?: A Little Help from another person toileting, which includes using toliet, bedpan, or urinal?: A Little Help from another person bathing (including washing, rinsing, drying)?: A Little Help from another person to put on and taking off regular upper body clothing?: A Little Help from another person to put on and taking off regular lower body clothing?: A Little 6 Click Score: 19   End of Session Equipment Utilized During Treatment: Gait belt Nurse Communication: Mobility status  Activity Tolerance: Patient tolerated treatment well Patient left: in bed;with call bell/phone within reach;with bed alarm set  OT Visit Diagnosis: Unsteadiness on feet (R26.81)                Time: 1245-8099 OT Time Calculation (min): 28 min Charges:  OT General Charges $OT Visit: 1 Visit OT Evaluation $OT Eval Moderate Complexity: 1 Mod  OT Treatments $Self Care/Home Management : 8-22 mins  Matthew Folks, OTR/L [facility] [phone]

## 2020-11-01 DIAGNOSIS — F331 Major depressive disorder, recurrent, moderate: Secondary | ICD-10-CM | POA: Diagnosis present

## 2020-11-01 DIAGNOSIS — F419 Anxiety disorder, unspecified: Secondary | ICD-10-CM | POA: Diagnosis present

## 2020-11-01 DIAGNOSIS — R9082 White matter disease, unspecified: Secondary | ICD-10-CM | POA: Diagnosis present

## 2020-11-01 DIAGNOSIS — Z789 Other specified health status: Secondary | ICD-10-CM | POA: Diagnosis not present

## 2020-11-01 DIAGNOSIS — F0281 Dementia in other diseases classified elsewhere with behavioral disturbance: Secondary | ICD-10-CM | POA: Diagnosis present

## 2020-11-01 DIAGNOSIS — Z7189 Other specified counseling: Secondary | ICD-10-CM | POA: Diagnosis not present

## 2020-11-01 DIAGNOSIS — Z8619 Personal history of other infectious and parasitic diseases: Secondary | ICD-10-CM | POA: Diagnosis not present

## 2020-11-01 DIAGNOSIS — G309 Alzheimer's disease, unspecified: Secondary | ICD-10-CM | POA: Diagnosis present

## 2020-11-01 DIAGNOSIS — I10 Essential (primary) hypertension: Secondary | ICD-10-CM | POA: Diagnosis present

## 2020-11-01 DIAGNOSIS — Z66 Do not resuscitate: Secondary | ICD-10-CM

## 2020-11-01 DIAGNOSIS — K219 Gastro-esophageal reflux disease without esophagitis: Secondary | ICD-10-CM | POA: Diagnosis present

## 2020-11-01 DIAGNOSIS — E039 Hypothyroidism, unspecified: Secondary | ICD-10-CM | POA: Diagnosis present

## 2020-11-01 DIAGNOSIS — Z515 Encounter for palliative care: Secondary | ICD-10-CM

## 2020-11-01 DIAGNOSIS — I4510 Unspecified right bundle-branch block: Secondary | ICD-10-CM | POA: Diagnosis present

## 2020-11-01 DIAGNOSIS — R946 Abnormal results of thyroid function studies: Secondary | ICD-10-CM | POA: Diagnosis present

## 2020-11-01 DIAGNOSIS — Z23 Encounter for immunization: Secondary | ICD-10-CM | POA: Diagnosis present

## 2020-11-01 DIAGNOSIS — D890 Polyclonal hypergammaglobulinemia: Secondary | ICD-10-CM | POA: Diagnosis present

## 2020-11-01 DIAGNOSIS — E43 Unspecified severe protein-calorie malnutrition: Secondary | ICD-10-CM | POA: Diagnosis present

## 2020-11-01 DIAGNOSIS — Z20822 Contact with and (suspected) exposure to covid-19: Secondary | ICD-10-CM | POA: Diagnosis present

## 2020-11-01 DIAGNOSIS — R55 Syncope and collapse: Secondary | ICD-10-CM | POA: Diagnosis present

## 2020-11-01 DIAGNOSIS — Z90710 Acquired absence of both cervix and uterus: Secondary | ICD-10-CM | POA: Diagnosis not present

## 2020-11-01 DIAGNOSIS — K5900 Constipation, unspecified: Secondary | ICD-10-CM | POA: Diagnosis not present

## 2020-11-01 DIAGNOSIS — Z6821 Body mass index (BMI) 21.0-21.9, adult: Secondary | ICD-10-CM | POA: Diagnosis not present

## 2020-11-01 DIAGNOSIS — Z8249 Family history of ischemic heart disease and other diseases of the circulatory system: Secondary | ICD-10-CM | POA: Diagnosis not present

## 2020-11-01 DIAGNOSIS — Z9010 Acquired absence of unspecified breast and nipple: Secondary | ICD-10-CM | POA: Diagnosis not present

## 2020-11-01 DIAGNOSIS — R627 Adult failure to thrive: Secondary | ICD-10-CM | POA: Diagnosis present

## 2020-11-01 DIAGNOSIS — R4182 Altered mental status, unspecified: Secondary | ICD-10-CM | POA: Diagnosis present

## 2020-11-01 DIAGNOSIS — R54 Age-related physical debility: Secondary | ICD-10-CM | POA: Diagnosis present

## 2020-11-01 MED ORDER — INFLUENZA VAC A&B SA ADJ QUAD 0.5 ML IM PRSY
0.5000 mL | PREFILLED_SYRINGE | Freq: Once | INTRAMUSCULAR | Status: AC
  Administered 2020-11-01: 0.5 mL via INTRAMUSCULAR
  Filled 2020-11-01: qty 0.5

## 2020-11-01 MED ORDER — COVID-19 MRNA VAC-TRIS(PFIZER) 30 MCG/0.3ML IM SUSP
0.3000 mL | Freq: Once | INTRAMUSCULAR | Status: AC
  Administered 2020-11-01: 0.3 mL via INTRAMUSCULAR
  Filled 2020-11-01: qty 0.3

## 2020-11-01 MED ORDER — INFLUENZA VAC A&B SA ADJ QUAD 0.5 ML IM PRSY: 0.5000 mL | PREFILLED_SYRINGE | INTRAMUSCULAR | Status: DC

## 2020-11-01 MED ORDER — DOCUSATE SODIUM 100 MG PO CAPS
100.0000 mg | ORAL_CAPSULE | Freq: Two times a day (BID) | ORAL | Status: DC
  Administered 2020-11-01 – 2020-11-10 (×18): 100 mg via ORAL
  Filled 2020-11-01 (×18): qty 1
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
## 2020-11-01 NOTE — Consult Note (Signed)
Consultation Note Date: 11/01/2020   Patient Name: Nicole Ramsey  DOB: [birth year]  MRN: [id number]  Age / Sex: 87 y.o., female  PCP: Baxter Hire, MD Referring Physician: Lorella Nimrod, MD  Reason for Consultation: Establishing goals of care  HPI/Patient Profile: 87 y.o. female  with past medical history of HTN and hypothyroidism admitted on 10/27/2020 with acute onset of failure to thrive and recent altered mental status with hallucinations, paranoia, and increasing anxiety.  Had syncopal episode in ED and was admitted for work up which is negative so far. Was also seen by psych - continues on lexapro and seroquel. PMT consulted to discuss goals of care.   Clinical Assessment and Goals of Care: I have reviewed medical records including EPIC notes, labs and imaging, received report from RN, assessed the patient and then met with patient's son, Rush Landmark, and DIL  to discuss diagnosis prognosis, GOC, EOL wishes, disposition and options.  I introduced Palliative Medicine as specialized medical care for people living with serious illness. It focuses on providing relief from the symptoms and stress of a serious illness. The goal is to improve quality of life for both the patient and the family.  During my visit we were joined by case manager for ongoing discussion of disposition - seems as though family is pursuing an ALF.   Patient speaks to me briefly - has no concerns, is comfortable. Eating lunch - feeding herself. Eating majority of food on plate.  Family reports feeling overwhelmed by the multiple decisions they need to make and they are also short on time - they have to leave town tomorrow. Therefore, our discussion was somewhat brief.   Advance directives, concepts specific to code status, artificial feeding and hydration, and rehospitalization were considered and discussed.  I completed a MOST form today. The patient and family outlined  their wishes for the following treatment decisions:  Cardiopulmonary Resuscitation: Do Not Attempt Resuscitation (DNR/No CPR)  Medical Interventions: Limited Additional Interventions: Use medical treatment, IV fluids and cardiac monitoring as indicated, DO NOT USE intubation or mechanical ventilation. May consider use of less invasive airway support such as BiPAP or CPAP. Also provide comfort measures. Transfer to the hospital if indicated. Avoid intensive care.   Antibiotics: Antibiotics if indicated  IV Fluids: IV fluids if indicated  Feeding Tube: No feeding tube   Though family agrees to the above and tells me this is in line with patient's previously stated wishes, they do request time to review with patients other children before signing. Unsigned document left with family and will follow up with them tomorrow.   Family was sure that patient has been clear about wishes for DNR so DNR orders were placed and DNR formed signed and placed on chart, copy given to family.   Discussed with family the importance of continued conversation with family and the medical providers regarding overall plan of care and treatment options, ensuring decisions are within the context of the patient's values and GOCs.    Questions and concerns were addressed. The family was encouraged to call with questions or concerns.   Primary Decision Maker NEXT OF KIN - adult children  SUMMARY OF RECOMMENDATIONS   - MOST filled out as above (DNR, limited medical interventions, no feeding tube), not yet signed - waiting on majority of children to confirm selections - will follow up tomorrow - DNR placed on chart - plans to dc to ALF soon  Code Status/Advance Care Planning:  DNR  Prognosis:  Unable to determine  Discharge Planning: ALF      Primary Diagnoses: Present on Admission: . Syncope   I have reviewed the medical record, interviewed the patient and family, and examined the patient. The following  aspects are pertinent.  Past Medical History:  Diagnosis Date  . Hypertension   . Thyroid disease    Social History   Socioeconomic History  . Marital status: Married    Spouse name: Not on file  . Number of children: Not on file  . Years of education: Not on file  . Highest education level: Not on file  Occupational History  . Not on file  Tobacco Use  . Smoking status: Never Smoker  . Smokeless tobacco: Not on file  Substance and Sexual Activity  . Alcohol use: Not Currently  . Drug use: Not on file  . Sexual activity: Not on file  Other Topics Concern  . Not on file  Social History Narrative  . Not on file   Social Determinants of Health   Financial Resource Strain: Not on file  Food Insecurity: Not on file  Transportation Needs: Not on file  Physical Activity: Not on file  Stress: Not on file  Social Connections: Not on file   No family history on file. Scheduled Meds: . amLODipine  10 mg Oral Daily  . atenolol  50 mg Oral Daily  . COVID-19 mRNA Vac-TriS (Pfizer)  0.3 mL Intramuscular Once  . enoxaparin (LOVENOX) injection  40 mg Subcutaneous Q24H  . escitalopram  5 mg Oral Daily  . influenza vaccine adjuvanted  0.5 mL Intramuscular Once  . levothyroxine  75 mcg Oral Q0600  . pantoprazole  40 mg Oral Daily  . QUEtiapine  25 mg Oral QHS  . sodium chloride flush  3 mL Intravenous Q12H   Continuous Infusions: PRN Meds:.acetaminophen **OR** acetaminophen, alum & mag hydroxide-simeth, magnesium hydroxide, ondansetron **OR** ondansetron (ZOFRAN) IV, traZODone Allergies  Allergen Reactions  . Aspirin Other (See Comments)    Elevated blood pressure   Review of Systems  Unable to perform ROS: Dementia    Physical Exam Constitutional:      General: She is not in acute distress.    Appearance: She is not ill-appearing.  Pulmonary:     Effort: Pulmonary effort is normal. No respiratory distress.  Skin:    General: Skin is warm and dry.  Neurological:      Mental Status: She is alert. She is disoriented.  Psychiatric:        Attention and Perception: She is inattentive.        Mood and Affect: Mood normal.        Behavior: Behavior is cooperative.        Cognition and Memory: Cognition is impaired. Memory is impaired.     Vital Signs: BP (!) 113/55 (BP Location: Left Arm)   Pulse 65   Temp 98.4 F (36.9 C)   Resp 16   Ht _0  (1.651 m)   Wt 55.8 kg   SpO2 99%   BMI 20.47 kg/m  Pain Scale: 0-10   Pain Score: 0-No pain   SpO2: SpO2: 99 % O2 Device:SpO2: 99 % O2 Flow Rate: .   IO: Intake/output summary:   Intake/Output Summary (Last 24 hours) at 11/01/2020 1338 Last data filed at 11/01/2020 0940 Gross per 24 hour  Intake 360 ml  Output 1100 ml  Net -740 ml    LBM: Last BM Date:  (unknown per patient) Baseline Weight:  Weight: 59 kg Most recent weight: Weight: 55.8 kg     Palliative Assessment/Data: PPS 60%    Time Total: 40 minutes Greater than 50%  of this time was spent counseling and coordinating care related to the above assessment and plan.  Juel Burrow, DNP, AGNP-C Palliative Medicine Team [phone] Pager: [phone]

## 2020-11-01 NOTE — TOC Progression Note (Signed)
Transition of Care [facility]) - Progression Note    Patient Details  Name: LAVANA HUCKEBA MRN: [id number] Date of Birth: 1927/04/14  Transition [facility] CM/SW Contact  Chapman Fitch, RN Phone Number: 11/01/2020, 3:35 PM  Clinical Narrative:     Son Chrissie Noa and daughter in law brenda at bedside.  They are still both in agreement of planning for ALF level of care.  They have arranged for Tammy with [facility] to come to complete a bedside assessment.   Family states that it is not an option for patient to stay with daughter that lives locally, son lives in [place], and is is not an option for patient to come back home with them to [place].    Tammy with [facility] confirms they can offer a bed pending family complete the financial component.   [facility] currently here assessing patient to determine if they can offer a private pay ALF bed.    Son and daughter in law do not want to accept a bed at [facility] at this time.  They state the have to speak with the rest of the family, need to speak to the patiens attorney, and need to have time to "look in to everything"  TOC to follow up with family in the morning to determine the final decision on placement as per MD patient is medically ready for discharge  Expected Discharge Plan: Home/Self Care Barriers to Discharge: Unsafe home situation,Family Issues  Expected Discharge Plan and Services Expected Discharge Plan: Home/Self Care                                               Social Determinants of Health (SDOH) Interventions    Readmission Risk Interventions No flowsheet data found.

## 2020-11-01 NOTE — Progress Notes (Signed)
Physical Therapy Treatment Patient Details Name: Nicole Ramsey MRN: [id number] DOB: 1927-07-29 Today's Date: 11/01/2020    History of Present Illness 87 y.o. female with a known history of essential hypertension and hypothyroidism, who presented to the emergency room with acute onset of failure to thrive with recent altered mental status with hallucination and paranoia increasing difficulty with anxiety. She was admitted under medical service for presumed syncopal work-up.    PT Comments    Pt resting in bed upon PT arrival; requiring some encouragement to participate initially.  Modified independent semi-supine to sitting edge of bed; CGA to SBA with transfers; and initially min assist ambulating first 60 feet but progressed to CGA rest of ambulation (240 feet total)--pt appearing shaky during ambulation but pt's balance improved with increased distance ambulating.  Pt's son present end of session and reporting pt's cognition improved today (compared to yesterday).  Will continue to focus on strengthening, balance, and progressive functional mobility during hospitalization.   Follow Up Recommendations  Home health PT     Equipment Recommendations   (pt reports having RW at home)    Recommendations for Other Services       Precautions / Restrictions Precautions Precautions: Fall Restrictions Weight Bearing Restrictions: No    Mobility  Bed Mobility Overal bed mobility: Modified Independent             General bed mobility comments: Semi-supine to sitting edge of bed with mild increased effort  Transfers Overall transfer level: Needs assistance Equipment used: None Transfers: Sit to/from Stand Sit to Stand: Supervision;Min guard         General transfer comment: fairly strong stand from bed and controlled descent sitting in recliner  Ambulation/Gait Ambulation/Gait assistance: Min assist;Min guard Gait Distance (Feet): 240 Feet Assistive device: None   Gait  velocity: decreased   General Gait Details: pt initially mildly unsteady requiring min assist to steady first 60 feet of ambulation but improved steadiness noted rest of ambulation (CGA provided for safety); pt still with shaky gait throughout ambulation but no loss of balance noted after first 60 feet   Stairs             Wheelchair Mobility    Modified Rankin (Stroke Patients Only)       Balance Overall balance assessment: Needs assistance Sitting-balance support: No upper extremity supported;Feet supported Sitting balance-Leahy Scale: Normal Sitting balance - Comments: steady sitting reaching outside BOS   Standing balance support: No upper extremity supported Standing balance-Leahy Scale: Fair Standing balance comment: steady standing putting on robe                            Cognition Arousal/Alertness: Awake/alert Behavior During Therapy: WFL for tasks assessed/performed Overall Cognitive Status: Impaired/Different from baseline                                 General Comments: Oriented to person and place      Exercises      General Comments   Nursing cleared pt for participation in physical therapy.  Pt agreeable to PT session.  Pt's son and daughter in law present end of session.      Pertinent Vitals/Pain Pain Assessment: No/denies pain  Vitals (HR and O2 on room air) stable and WFL throughout treatment session.    Home Living  Prior Function            PT Goals (current goals can now be found in the care plan section) Acute Rehab PT Goals Patient Stated Goal: to improve strength and mobility PT Goal Formulation: With patient Time For Goal Achievement: 11/11/20 Potential to Achieve Goals: Good Progress towards PT goals: Progressing toward goals    Frequency    Min 2X/week      PT Plan Current plan remains appropriate    Co-evaluation              AM-PAC PT "6 Clicks"  Mobility   Outcome Measure  Help needed turning from your back to your side while in a flat bed without using bedrails?: None Help needed moving from lying on your back to sitting on the side of a flat bed without using bedrails?: A Little Help needed moving to and from a bed to a chair (including a wheelchair)?: A Little Help needed standing up from a chair using your arms (e.g., wheelchair or bedside chair)?: A Little Help needed to walk in hospital room?: A Little Help needed climbing 3-5 steps with a railing? : A Little 6 Click Score: 19    End of Session Equipment Utilized During Treatment: Gait belt Activity Tolerance: Patient tolerated treatment well Patient left: in chair;with call bell/phone within reach;with chair alarm set;with family/visitor present Nurse Communication: Mobility status;Precautions PT Visit Diagnosis: Unsteadiness on feet (R26.81);Muscle weakness (generalized) (M62.81)     Time: 6295-2841 PT Time Calculation (min) (ACUTE ONLY): 27 min  Charges:  $Gait Training: 8-22 mins $Therapeutic Exercise: 8-22 mins                    Hendricks Limes, PT 11/01/20, 12:21 PM

## 2020-11-01 NOTE — Consult Note (Signed)
[facility] Consult   Reason for Consult: Follow-up for this 87 year old woman with some memory loss and mood problems Referring Physician:  Reesa Chew Patient Identification: Nicole Ramsey MRN:  [id number] Principal Diagnosis: Depression, major, recurrent, moderate ([place]) Diagnosis:  Principal Problem:   Depression, major, recurrent, moderate ([place]) Active Problems:   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 ([place])   Hypothyroid   Hypertension   Syncope   Total Time spent with patient: 30 minutes  Subjective:   Nicole Ramsey is a 87 y.o. female patient admitted with "I am feeling fine".  HPI: Patient seen chart reviewed.  Patient was sitting up in the bedside chair.  Her son and daughter-in-law were present.  Patient says she is feeling fine.  She has no complaints.  Denies emotional problems.  Denies hallucinations.  States that she slept well.  Her only request is that she would like to be discharged as soon as possible.  Daughter-in-law reports that the patient seems to be doing better.  More alert mood seems to be better.  There do not seem to have been any episodes of hallucinations here in the hospital  Past Psychiatric History: A couple years of increasing problems with confusion and hallucinations at home and recently what seems like a mild depression  Risk to Self:   Risk to Others:   Prior Inpatient Therapy:   Prior Outpatient Therapy:    Past Medical History:  Past Medical History:  Diagnosis Date  . Hypertension   . Thyroid disease    History reviewed. No pertinent surgical history. Family History: No family history on file. Family Psychiatric  History: See previous Social History:  Social History   Substance and Sexual Activity  Alcohol Use Not Currently     Social History   Substance and Sexual Activity  Drug Use Not on file    Social History   Socioeconomic History  . Marital status: Married    Spouse name: Not on  file  . Number of children: Not on file  . Years of education: Not on file  . Highest education level: Not on file  Occupational History  . Not on file  Tobacco Use  . Smoking status: Never Smoker  . Smokeless tobacco: Not on file  Substance and Sexual Activity  . Alcohol use: Not Currently  . Drug use: Not on file  . Sexual activity: Not on file  Other Topics Concern  . Not on file  Social History Narrative  . Not on file   Social Determinants of Health   Financial Resource Strain: Not on file  Food Insecurity: Not on file  Transportation Needs: Not on file  Physical Activity: Not on file  Stress: Not on file  Social Connections: Not on file   Additional Social History:    Allergies:   Allergies  Allergen Reactions  . Aspirin Other (See Comments)    Elevated blood pressure    Labs:  Results for orders placed or performed during the hospital encounter of 10/27/20 (from the past 48 hour(s))  Basic metabolic panel     Status: Abnormal   Collection Time: 10/31/20  4:21 AM  Result Value Ref Range   Sodium 135 135 - 145 mmol/L   Potassium 3.5 3.5 - 5.1 mmol/L   Chloride 100 98 - 111 mmol/L   CO2 25 22 - 32 mmol/L   Glucose, Bld 113 (H) 70 - 99 mg/dL    Comment: Glucose reference range applies only to samples  taken after fasting for at least 8 hours.   BUN 19 8 - 23 mg/dL   Creatinine, Ser 1.02 (H) 0.44 - 1.00 mg/dL   Calcium 9.0 8.9 - 10.3 mg/dL   GFR, Estimated 52 (L) >60 mL/min    Comment: (NOTE) Calculated using the CKD-EPI Creatinine Equation (2021)    Anion gap 10 5 - 15    Comment: Performed at [facility], [address] [place], [place] [address]  CBC     Status: None   Collection Time: 10/31/20  4:21 AM  Result Value Ref Range   WBC 9.7 4.0 - 10.5 K/uL   RBC 4.59 3.87 - 5.11 MIL/uL   Hemoglobin 13.2 12.0 - 15.0 g/dL   HCT 39.2 36.0 - 46.0 %   MCV 85.4 80.0 - 100.0 fL   MCH 28.8 26.0 - 34.0 pg   MCHC 33.7 30.0 - 36.0 g/dL   RDW 15.1  11.5 - 15.5 %   Platelets 207 150 - 400 K/uL   nRBC 0.0 0.0 - 0.2 %    Comment: Performed at [facility], [address] [place], [place] [address]  Glucose, capillary     Status: Abnormal   Collection Time: 10/31/20  4:47 AM  Result Value Ref Range   Glucose-Capillary 118 (H) 70 - 99 mg/dL    Comment: Glucose reference range applies only to samples taken after fasting for at least 8 hours.    Current Facility-Administered Medications  Medication Dose Route Frequency Provider Last Rate Last Admin  . acetaminophen (TYLENOL) tablet 650 mg  650 mg Oral Q6H PRN Mansy, Jan A, MD       Or  . acetaminophen (TYLENOL) suppository 650 mg  650 mg Rectal Q6H PRN Mansy, Jan A, MD      . alum & mag hydroxide-simeth (MAALOX/MYLANTA) 200-200-20 MG/5ML suspension 30 mL  30 mL Oral Q6H PRN Mansy, Jan A, MD      . amLODipine (NORVASC) tablet 10 mg  10 mg Oral Daily Clapacs, Madie Reno, MD   10 mg at 11/01/20 0845  . atenolol (TENORMIN) tablet 50 mg  50 mg Oral Daily Clapacs, Madie Reno, MD   50 mg at 11/01/20 0847  . docusate sodium (COLACE) capsule 100 mg  100 mg Oral BID Lorella Nimrod, MD      . enoxaparin (LOVENOX) injection 40 mg  40 mg Subcutaneous Q24H Mansy, Jan A, MD   40 mg at 11/01/20 0846  . escitalopram (LEXAPRO) tablet 5 mg  5 mg Oral Daily Clapacs, Madie Reno, MD   5 mg at 11/01/20 0846  . influenza vaccine adjuvanted (FLUAD) injection 0.5 mL  0.5 mL Intramuscular Once Lorella Nimrod, MD      . levothyroxine (SYNTHROID) tablet 75 mcg  75 mcg Oral Q0600 Lorella Nimrod, MD   75 mcg at 11/01/20 0548  . magnesium hydroxide (MILK OF MAGNESIA) suspension 30 mL  30 mL Oral Daily PRN Mansy, Jan A, MD   30 mL at 11/01/20 1413  . ondansetron (ZOFRAN) tablet 4 mg  4 mg Oral Q6H PRN Mansy, Jan A, MD       Or  . ondansetron [facility]) injection 4 mg  4 mg Intravenous Q6H PRN Mansy, Jan A, MD      . pantoprazole (PROTONIX) EC tablet 40 mg  40 mg Oral Daily Clapacs, Madie Reno, MD   40 mg at 11/01/20 0845  .  QUEtiapine (SEROQUEL) tablet 25 mg  25 mg Oral QHS Clapacs, John  T, MD   25 mg at 10/31/20 2035  . sodium chloride flush (NS) 0.9 % injection 3 mL  3 mL Intravenous Q12H Mansy, Jan A, MD   3 mL at 11/01/20 0848  . traZODone (DESYREL) tablet 25 mg  25 mg Oral QHS PRN Mansy, Arvella Merles, MD        Musculoskeletal: Strength & Muscle Tone: within normal limits Gait & Station: normal Patient leans: N/A  Psychiatric Specialty Exam: Physical Exam Vitals and nursing note reviewed.  Constitutional:      Appearance: She is well-developed and well-nourished.  HENT:     Head: Normocephalic and atraumatic.  Eyes:     Conjunctiva/sclera: Conjunctivae normal.     Pupils: Pupils are equal, round, and reactive to light.  Cardiovascular:     Heart sounds: Normal heart sounds.  Pulmonary:     Effort: Pulmonary effort is normal.  Abdominal:     Palpations: Abdomen is soft.  Musculoskeletal:        General: Normal range of motion.     Cervical back: Normal range of motion.  Skin:    General: Skin is warm and dry.  Neurological:     Mental Status: She is alert.  Psychiatric:        Attention and Perception: Attention normal.        Mood and Affect: Mood normal.        Speech: Speech is delayed.        Behavior: Behavior is slowed.        Thought Content: Thought content is not paranoid. Thought content does not include homicidal or suicidal ideation.        Cognition and Memory: Memory is impaired.        Judgment: Judgment normal.     Review of Systems  Constitutional: Negative.   HENT: Negative.   Eyes: Negative.   Respiratory: Negative.   Cardiovascular: Negative.   Gastrointestinal: Negative.   Musculoskeletal: Negative.   Skin: Negative.   Neurological: Negative.   Psychiatric/Behavioral: Negative.     Blood pressure (!) 113/55, pulse 65, temperature 98.4 F (36.9 C), resp. rate 16, height _0  (1.651 m), weight 55.8 kg, SpO2 99 %.Body mass index is 20.47 kg/m.  General  Appearance: Casual  Eye Contact:  Fair  Speech:  Slow  Volume:  Decreased  Mood:  Euthymic  Affect:  Congruent  Thought Process:  Coherent  Orientation:  Full (Time, Place, and Person)  Thought Content:  Logical  Suicidal Thoughts:  No  Homicidal Thoughts:  No  Memory:  Immediate;   Fair Recent;   Fair Remote;   Fair  Judgement:  Fair  Insight:  Fair  Psychomotor Activity:  Decreased  Concentration:  Concentration: Fair  Recall:  Poor  Fund of Knowledge:  Fair  Language:  Fair  Akathisia:  No  Handed:  Right  AIMS (if indicated):     Assets:  Desire for Improvement Resilience Social Support  ADL's:  Impaired  Cognition:  Impaired,  Mild  Sleep:        Treatment Plan Summary: Medication management and Plan Reviewed plan with patient and perhaps more importantly with the daughter-in-law who is taking the lead in making sure the patient's clinical needs are being met.  Patient currently seems to be happy and without any complaints.  No acute psychotic concerns.  At this point I recommend continuing the very modest dose Lexapro that was started.  Patient has as needed orders for medicine to  help her sleep if needed but it looks like the 25 mg of Seroquel she is getting is probably working fine.  No change to medicine.  Encouragement to the patient and family.  They are optimistic about getting her into assisted living soon.  I will follow as needed.  Disposition: Supportive therapy provided about ongoing stressors.  Alethia Berthold, MD 11/01/2020 3:09 PM

## 2020-11-02 DIAGNOSIS — F331 Major depressive disorder, recurrent, moderate: Secondary | ICD-10-CM | POA: Diagnosis not present

## 2020-11-02 LAB — GLUCOSE, CAPILLARY: Glucose-Capillary: 124 mg/dL — ABNORMAL HIGH (ref 70–99)

## 2020-11-02 NOTE — Progress Notes (Signed)
Occupational Therapy Treatment Patient Details Name: Nicole Ramsey MRN: [id number] DOB: Jan 08, 1927 Today's Date: 11/02/2020    History of present illness 87 y.o. female with a known history of essential hypertension and hypothyroidism, who presented to the emergency room with acute onset of failure to thrive with recent altered mental status with hallucination and paranoia increasing difficulty with anxiety. She was admitted under medical service for presumed syncopal work-up.   OT comments  Pt seen for OT tx this date. Pt alert, eager to participate. Pt performed bed mobility with supervision and no difficulty noted. STS transfer with RW and CGA. Pt tolerated standing at sink to wash face with set up. Orthostatic vitals taken during session (supine 114/55, HR 62, sitting 101/61, HR 61 and endorsed feeling "fuzzy", standing 98/52, 119/53, HR 66.) RN notified of vitals taken. Pt set up with juice and crackers at end of session. Pt progressing towards goals, discharge recommendation updated based on performance. Pt continues to benefit from skilled OT services to maximize return to PLOF.    Follow Up Recommendations  Home health OT    Equipment Recommendations  None recommended by OT    Recommendations for Other Services      Precautions / Restrictions Precautions Precautions: Fall Restrictions Weight Bearing Restrictions: No Other Position/Activity Restrictions: Monitor orthostatics       Mobility Bed Mobility                  Transfers Overall transfer level: Needs assistance Equipment used: Rolling walker (2 wheeled) Transfers: Sit to/from Stand Sit to Stand: Min guard              Balance Overall balance assessment: Needs assistance Sitting-balance support: No upper extremity supported;Feet supported Sitting balance-Leahy Scale: Normal     Standing balance support: No upper extremity supported Standing balance-Leahy Scale: Fair                              ADL either performed or assessed with clinical judgement   ADL Overall ADL's : Needs assistance/impaired     Grooming: Wash/dry face;Standing;Supervision/safety                                       Vision Baseline Vision/History: Wears glasses Wears Glasses: At all times     Perception     Praxis      Cognition Arousal/Alertness: Awake/alert Behavior During Therapy: WFL for tasks assessed/performed Overall Cognitive Status: Within Functional Limits for tasks assessed                                          Exercises     Shoulder Instructions       General Comments      Pertinent Vitals/ Pain       Pain Assessment: No/denies pain  Home Living                                          Prior Functioning/Environment              Frequency  Min 1X/week        Progress Toward Goals  OT Goals(current goals can now be found in the care plan section)  Progress towards OT goals: Progressing toward goals  Acute Rehab OT Goals Patient Stated Goal: to improve strength and mobility OT Goal Formulation: With patient Time For Goal Achievement: 11/14/20 Potential to Achieve Goals: Good  Plan Discharge plan needs to be updated;Frequency remains appropriate    Co-evaluation                 AM-PAC OT "6 Clicks" Daily Activity     Outcome Measure   Help from another person eating meals?: None Help from another person taking care of personal grooming?: A Little Help from another person toileting, which includes using toliet, bedpan, or urinal?: A Little Help from another person bathing (including washing, rinsing, drying)?: A Little Help from another person to put on and taking off regular upper body clothing?: A Little Help from another person to put on and taking off regular lower body clothing?: A Little 6 Click Score: 19    End of Session Equipment Utilized During  Treatment: Gait belt;Rolling walker  OT Visit Diagnosis: Unsteadiness on feet (R26.81)   Activity Tolerance Patient tolerated treatment well   Patient Left in bed;with call bell/phone within reach;with bed alarm set   Nurse Communication Mobility status (orthostatics)        Time: 0037-0488 OT Time Calculation (min): 25 min  Charges: OT General Charges $OT Visit: 1 Visit OT Treatments $Self Care/Home Management : 23-37 mins  Richrd Prime, MPH, MS, OTR/L ascom [phone] 11/02/20, 4:47 PM
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
## 2020-11-02 NOTE — TOC Progression Note (Signed)
Transition of Care [facility]) - Progression Note    Patient Details  Name: Nicole Ramsey MRN: [id number] Date of Birth: May 22, 1927  [facility]) CM/SW Contact  Chapman Fitch, RN Phone Number: 11/02/2020, 9:38 AM  Clinical Narrative:     Per Judeth Cornfield at [facility] they are not able to offer a bed to patient   Expected Discharge Plan: Home/Self Care Barriers to Discharge: Unsafe home situation,Family Issues  Expected Discharge Plan and Services Expected Discharge Plan: Home/Self Care                                               Social Determinants of Health (SDOH) Interventions    Readmission Risk Interventions No flowsheet data found.

## 2020-11-02 NOTE — Progress Notes (Signed)
Mobility Specialist - Progress Note   11/02/20 1700  Mobility  Activity Refused mobility  Mobility performed by Mobility specialist     Pt beginning to eat supper at time of arrival and wants to hold off at this time. Will attempt session at another date/time.   Filiberto Pinks Mobility Specialist 11/02/20, 5:11 PM
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
## 2020-11-03 ENCOUNTER — Encounter: Payer: Self-pay | Admitting: Family Medicine

## 2020-11-03 DIAGNOSIS — G309 Alzheimer's disease, unspecified: Secondary | ICD-10-CM | POA: Diagnosis not present

## 2020-11-03 DIAGNOSIS — Z515 Encounter for palliative care: Secondary | ICD-10-CM | POA: Diagnosis not present

## 2020-11-03 DIAGNOSIS — F331 Major depressive disorder, recurrent, moderate: Secondary | ICD-10-CM | POA: Diagnosis not present

## 2020-11-03 DIAGNOSIS — E43 Unspecified severe protein-calorie malnutrition: Secondary | ICD-10-CM | POA: Insufficient documentation

## 2020-11-03 DIAGNOSIS — Z7189 Other specified counseling: Secondary | ICD-10-CM | POA: Diagnosis not present

## 2020-11-03 DIAGNOSIS — Z66 Do not resuscitate: Secondary | ICD-10-CM | POA: Diagnosis not present

## 2020-11-03 MED ORDER — ENSURE ENLIVE PO LIQD
237.0000 mL | Freq: Two times a day (BID) | ORAL | Status: DC
  Administered 2020-11-03 – 2020-11-10 (×12): 237 mL via ORAL

## 2020-11-03 MED ORDER — SENNA 8.6 MG PO TABS: 1.0000 | ORAL_TABLET | Freq: Every day | ORAL | Status: DC

## 2020-11-03 MED ORDER — SENNA 8.6 MG PO TABS
2.0000 | ORAL_TABLET | Freq: Every day | ORAL | Status: DC
  Administered 2020-11-03 – 2020-11-09 (×7): 17.2 mg via ORAL
  Filled 2020-11-03 (×7): qty 2

## 2020-11-03 MED ORDER — ADULT MULTIVITAMIN W/MINERALS CH
1.0000 | ORAL_TABLET | Freq: Every day | ORAL | Status: DC
  Administered 2020-11-04 – 2020-11-10 (×7): 1 via ORAL
  Filled 2020-11-03 (×7): qty 1

## 2020-11-03 MED ORDER — SENNA 8.6 MG PO TABS: 2.0000 | ORAL_TABLET | Freq: Every day | ORAL | Status: DC

## 2020-11-03 NOTE — Progress Notes (Signed)
Physical Therapy Treatment Patient Details Name: Nicole Ramsey MRN: [id number] DOB: 31-Aug-1927 Today's Date: 11/03/2020    History of Present Illness 87 y.o. female with a known history of essential hypertension and hypothyroidism, who presented to the emergency room with acute onset of failure to thrive with recent altered mental status with hallucination and paranoia increasing difficulty with anxiety. She was admitted under medical service for presumed syncopal work-up.    PT Comments    Pt resting in bed upon PT arrival; agreeable to PT session.  Able to ambulate 240 feet (no AD) with CGA (pt noted to be shaky but overall steady--no loss of balance noted with ambulation or functional mobility during sessions activities).  Pt able to turn her head and look around frequently while ambulating without any loss of balance.  Tolerated sitting and standing ex's fairly well but requiring frequent cueing for technique.  Will continue to progress pt with strengthening, balance, and progressive functional mobility during hospitalization.    Follow Up Recommendations  Home health PT     Equipment Recommendations   (pt reports having RW at home)    Recommendations for Other Services       Precautions / Restrictions Precautions Precautions: Fall    Mobility  Bed Mobility Overal bed mobility: Modified Independent             General bed mobility comments: Semi-supine to sitting edge of bed with mild increased effort  Transfers Overall transfer level: Needs assistance Equipment used: None Transfers: Sit to/from Stand Sit to Stand: Min guard         General transfer comment: fairly strong stand from bed and controlled descent sitting in recliner  Ambulation/Gait Ambulation/Gait assistance: Min guard Gait Distance (Feet): 240 Feet Assistive device: None   Gait velocity: decreased   General Gait Details: partial step through gait pattern; pt mildly shaky with ambulation  but no loss of balance noted during session (pt often looking around while walking without any LOB)   Stairs             Wheelchair Mobility    Modified Rankin (Stroke Patients Only)       Balance Overall balance assessment: Needs assistance Sitting-balance support: No upper extremity supported;Feet supported Sitting balance-Leahy Scale: Normal Sitting balance - Comments: steady sitting reaching outside BOS   Standing balance support: No upper extremity supported   Standing balance comment: steady standing reaching within BOS                            Cognition Arousal/Alertness: Awake/alert Behavior During Therapy: WFL for tasks assessed/performed Overall Cognitive Status: No family/caregiver present to determine baseline cognitive functioning                                 General Comments: Oriented to person and place      Exercises Total Joint Exercises Standing Hip Extension: AROM;Strengthening;Both;10 reps;Standing (B UE support for balance) General Exercises - Lower Extremity Long Arc Quad: AROM;Strengthening;Both;10 reps;Seated Hip ABduction/ADduction: AROM;Strengthening;Both;10 reps;Standing (B UE support for balance) Hip Flexion/Marching: AROM;Strengthening;Both;10 reps;Seated Heel Raises: AROM;Strengthening;Both;10 reps;Standing (B UE support for balance) Other Exercises Other Exercises: Standing B hip extension x10 reps with B UE support    General Comments   Nursing cleared pt for participation in physical therapy.  Pt agreeable to PT session.      Pertinent Vitals/Pain Pain Assessment: No/denies  pain  Vitals (HR and O2 on room air) stable and WFL throughout treatment session.    Home Living                      Prior Function            PT Goals (current goals can now be found in the care plan section) Acute Rehab PT Goals Patient Stated Goal: to improve strength and mobility PT Goal Formulation: With  patient Time For Goal Achievement: 11/11/20 Potential to Achieve Goals: Good Progress towards PT goals: Progressing toward goals    Frequency    Min 2X/week      PT Plan Current plan remains appropriate    Co-evaluation              AM-PAC PT "6 Clicks" Mobility   Outcome Measure  Help needed turning from your back to your side while in a flat bed without using bedrails?: None Help needed moving from lying on your back to sitting on the side of a flat bed without using bedrails?: A Little Help needed moving to and from a bed to a chair (including a wheelchair)?: A Little Help needed standing up from a chair using your arms (e.g., wheelchair or bedside chair)?: A Little Help needed to walk in hospital room?: A Little Help needed climbing 3-5 steps with a railing? : A Little 6 Click Score: 19    End of Session Equipment Utilized During Treatment: Gait belt Activity Tolerance: Patient tolerated treatment well Patient left: in chair;with call bell/phone within reach;with chair alarm set Nurse Communication: Mobility status;Precautions PT Visit Diagnosis: Unsteadiness on feet (R26.81);Muscle weakness (generalized) (M62.81)     Time: 1000-1023 PT Time Calculation (min) (ACUTE ONLY): 23 min  Charges:  $Therapeutic Exercise: 8-22 mins $Therapeutic Activity: 8-22 mins                    Hendricks Limes, PT 11/03/20, 12:41 PM
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
## 2020-11-03 NOTE — Care Management Important Message (Signed)
Important Message  Patient Details  Name: Nicole Ramsey MRN: [id number] Date of Birth: March 19, 1927   Medicare Important Message Given:  N/A - LOS <3 / Initial given by admissions  Initial Medicare IM reviewed with patient by Jennye Moccasin, Patient Access Associate on 11/02/2020 at 9:51am.   Johnell Comings 11/03/2020, 9:03 AM

## 2020-11-03 NOTE — Progress Notes (Signed)
Physical Therapy Treatment Patient Details Name: Nicole Ramsey MRN: [id number] DOB: [birth year] Today's Date: 11/03/2020    History of Present Illness 87 y.o. female with a known history of essential hypertension and hypothyroidism, who presented to the emergency room with acute onset of failure to thrive with recent altered mental status with hallucination and paranoia increasing difficulty with anxiety. She was admitted under medical service for presumed syncopal work-up.    PT Comments    SW sent therapist message that pt's son Nicole Ramsey requesting to speak with therapist.  Therapist called pt's son (and daughter in law); therapist also spoke with pt who cleared PT to speak with these family members.  Therapist answered pt's families questions regarding continued PT recommendations upon hospital discharge and reasoning behind recommendations (family reporting pt not safe to discharge home alone).  Therapist updated SW on conversation with family and asked SW to call family back to make sure family had appropriate understanding of recommendations.  Pt resting in bed upon PT arrival; agreeable to walking.  Pt oriented to person, place, general situation, and reported it was February 2021.  Pt modified independent with bed mobility; independent with transfers; and SBA with ambulation around nursing loop 2x's and then another 1x (pt performed stairs trial between ambulation trials with CGA navigating 2 steps with B railings).  Pt continues to be mildly shaky with ambulation but overall steady (once during session pt was looking around--appearing distracted--when turning hallway corner and appeared startled when suddenly seeing medical equipment in hallway--pt with altered stepping pattern to side to avoid obstacle but able to self correct balance on own).  Continue to recommend HHPT.   Follow Up Recommendations  Home health PT     Equipment Recommendations   (pt reports having RW at home)     Recommendations for Other Services       Precautions / Restrictions Precautions Precautions: Fall    Mobility  Bed Mobility Overal bed mobility: Modified Independent             General bed mobility comments: Semi-supine to/from sitting edge of bed without any noted difficulty  Transfers Overall transfer level: Independent Equipment used: None Transfers: Sit to/from Stand Sit to Stand: Independent         General transfer comment: steady safe transfers noted from bed x3 trials and from chair x2 trials  Ambulation/Gait Ambulation/Gait assistance: Supervision Gait Distance (Feet):  (360 feet; 200 feet) Assistive device: None   Gait velocity: mildly decreased   General Gait Details: partial to full step through gait pattern; pt mildly shaky with ambulation but steady (pt looking around when turning hallway corner and was startled when suddenly seeing medical equipment in hallway--pt with altered stepping pattern to side to avoid obstacle but able to self correct balance on own)   Stairs Stairs: Yes Stairs assistance: Min guard Stair Management: Two rails;Forwards;Alternating pattern Number of Stairs: 2 General stair comments: steady safe stairs navigation   Wheelchair Mobility    Modified Rankin (Stroke Patients Only)       Balance Overall balance assessment: Needs assistance Sitting-balance support: No upper extremity supported;Feet supported Sitting balance-Leahy Scale: Normal Sitting balance - Comments: steady sitting reaching outside BOS   Standing balance support: No upper extremity supported Standing balance-Leahy Scale: Good Standing balance comment: steady standing washing hands at sink and reaching outside BOS to get paper towel to dry off hands and then to throw paper towel in garbage can  Cognition Arousal/Alertness: Awake/alert Behavior During Therapy: WFL for tasks assessed/performed Overall Cognitive  Status: Within Functional Limits for tasks assessed                                 General Comments: Oriented to person, place, general situation, and reported it was February 2021.      Exercises      General Comments  Pt agreeable to PT session.      Pertinent Vitals/Pain Pain Assessment: No/denies pain  Vitals (HR and O2 on room air) stable and WFL throughout treatment session.    Home Living                      Prior Function            PT Goals (current goals can now be found in the care plan section) Acute Rehab PT Goals Patient Stated Goal: to improve strength and mobility PT Goal Formulation: With patient Time For Goal Achievement: 11/11/20 Potential to Achieve Goals: Good Progress towards PT goals: Progressing toward goals    Frequency    Min 2X/week      PT Plan Current plan remains appropriate    Co-evaluation              AM-PAC PT "6 Clicks" Mobility   Outcome Measure  Help needed turning from your back to your side while in a flat bed without using bedrails?: None Help needed moving from lying on your back to sitting on the side of a flat bed without using bedrails?: None Help needed moving to and from a bed to a chair (including a wheelchair)?: None Help needed standing up from a chair using your arms (e.g., wheelchair or bedside chair)?: None Help needed to walk in hospital room?: A Little Help needed climbing 3-5 steps with a railing? : A Little 6 Click Score: 22    End of Session Equipment Utilized During Treatment: Gait belt Activity Tolerance: Patient tolerated treatment well Patient left: in bed;with call bell/phone within reach;with bed alarm set   PT Visit Diagnosis: Unsteadiness on feet (R26.81);Muscle weakness (generalized) (M62.81)     Time: 1510-1535 PT Time Calculation (min) (ACUTE ONLY): 25 min  Charges:  $Therapeutic Exercise: 8-22 mins $Therapeutic Activity: 8-22 mins                      Hendricks Limes, PT 11/03/20, 4:53 PM

## 2020-11-03 NOTE — Progress Notes (Signed)
Daily Progress Note   Patient Name: Nicole Ramsey       Date: 11/03/2020 DOB: 10-22-1926  Age: 87 y.o. MRN#: [id number] Attending Physician: Cipriano Bunker, MD Primary Care Physician: Gracelyn Nurse, MD Admit Date: 10/27/2020  Reason for Consultation/Follow-up: Establishing goals of care  Subjective: C/o constipation  Length of Stay: 2  Current Medications: Scheduled Meds:  . amLODipine  10 mg Oral Daily  . atenolol  50 mg Oral Daily  . docusate sodium  100 mg Oral BID  . enoxaparin (LOVENOX) injection  40 mg Subcutaneous Q24H  . escitalopram  5 mg Oral Daily  . feeding supplement  237 mL Oral BID BM  . levothyroxine  75 mcg Oral Q0600  . [START ON 11/04/2020] multivitamin with minerals  1 tablet Oral Daily  . pantoprazole  40 mg Oral Daily  . QUEtiapine  25 mg Oral QHS  . senna  2 tablet Oral QHS  . sodium chloride flush  3 mL Intravenous Q12H    Continuous Infusions:   PRN Meds: acetaminophen **OR** acetaminophen, alum & mag hydroxide-simeth, magnesium hydroxide, ondansetron **OR** ondansetron (ZOFRAN) IV, traZODone  Physical Exam Constitutional:      General: She is not in acute distress. Cardiovascular:     Pulses: Normal pulses.  Pulmonary:     Effort: Pulmonary effort is normal. No respiratory distress.  Skin:    General: Skin is warm and dry.  Neurological:     Mental Status: She is alert.     Comments: Easily confused  Psychiatric:     Comments: Slightly withdrawn             Vital Signs: BP (!) 123/51   Pulse 62   Temp 98.2 F (36.8 C)   Resp 16   Ht 5\' 5"  (1.651 m)   Wt 57.5 kg   SpO2 98%   BMI 21.09 kg/m  SpO2: SpO2: 98 % O2 Device: O2 Device: Room Air O2 Flow Rate:    Intake/output summary:   Intake/Output Summary (Last 24 hours) at 11/03/2020  1321 Last data filed at 11/03/2020 0900 Gross per 24 hour  Intake 1040 ml  Output --  Net 1040 ml   LBM: Last BM Date: 11/01/20 Baseline Weight: Weight: 59 kg Most recent weight: Weight: 57.5 kg       Palliative Assessment/Data: PPS 60%    Flowsheet Rows   Flowsheet Row Most Recent Value  Intake Tab   Referral Department Hospitalist  Unit at Time of Referral Med/Surg Unit  Palliative Care Primary Diagnosis Neurology  Date Notified 10/30/20  Palliative Care Type New Palliative care  Reason for referral Clarify Goals of Care  Date of Admission 10/27/20  Date first seen by Palliative Care 11/01/20  # of days Palliative referral response time 2 Day(s)  # of days IP prior to Palliative referral 3  Clinical Assessment   Palliative Performance Scale Score 60%  Psychosocial & Spiritual Assessment   Palliative Care Outcomes   Patient/Family meeting held? Yes  Who was at the meeting? son and DIL  Palliative Care Outcomes Clarified goals of care, Provided advance care planning, Changed CPR status, Completed durable DNR, Provided psychosocial or spiritual support  Patient Active Problem List   Diagnosis Date Noted  . Syncope 10/30/2020  . Depression, major, recurrent, moderate (HCC) 10/27/2020  . 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 (HCC) 10/27/2020  . Hypothyroid 10/27/2020  . Hypertension 10/27/2020    Palliative Care Assessment & Plan   HPI: 87 y.o. female  with past medical history of HTN and hypothyroidism admitted on 10/27/2020 with acute onset of failure to thrive and recent altered mental status with hallucinations, paranoia, and increasing anxiety.  Had syncopal episode in ED and was admitted for work up which is negative so far. Was also seen by psych - continues on lexapro and seroquel. PMT consulted to discuss goals of care.   Assessment: Follow up with son and his wife. We discuss need for MOST form to be signed - it has already been  completed. He tells me he has been so preoccupied with trying to find placement for his mother that they have been unable to discuss MOST form with family. He will attempt to discuss and sign it before leaving.   We discuss c/o constipation. Family confirms this is ongoing problem. We discuss initiation of bowel regimen. They are agreeable.  Plans remain to dc to ALF.   MOST completed as below though not yet signed by family Cardiopulmonary Resuscitation: Do Not Attempt Resuscitation (DNR/No CPR)  Medical Interventions: Limited Additional Interventions: Use medical treatment, IV fluids and cardiac monitoring as indicated, DO NOT USE intubation or mechanical ventilation. May consider use of less invasive airway support such as BiPAP or CPAP. Also provide comfort measures. Transfer to the hospital if indicated. Avoid intensive care.   Antibiotics: Antibiotics if indicated  IV Fluids: IV fluids if indicated  Feeding Tube: No feeding tube    Recommendations/Plan:  Senna 2 tabs qhs initiated  Pending family to sign MOST  Dc to ALF  DNR on chart  Code Status:  DNR  Prognosis:   Unable to determine  Discharge Planning:  ALF  Care plan was discussed with son  Thank you for allowing the Palliative Medicine Team to assist in the care of this patient.   Total Time 25 minutes Prolonged Time Billed  no       Greater than 50%  of this time was spent counseling and coordinating care related to the above assessment and plan.  Gerlean Ren, DNP, [facility] Palliative Medicine Team Team Phone # [phone]  Pager [phone]

## 2020-11-03 NOTE — Progress Notes (Signed)
Initial Nutrition Assessment  DOCUMENTATION CODES:   Severe malnutrition in context of social or environmental circumstances  INTERVENTION:   Ensure Enlive po BID, each supplement provides 350 kcal and 20 grams of protein  Magic cup TID with meals, each supplement provides 290 kcal and 9 grams of protein  MVI daily   Dysphagia 3 diet   NUTRITION DIAGNOSIS:   Severe Malnutrition related to social / environmental circumstances (advanced age) as evidenced by severe fat depletion,severe muscle depletion.  GOAL:   Patient will meet greater than or equal to 90% of their needs  MONITOR:   PO intake,Supplement acceptance,Labs,Weight trends,Skin,I & O's  REASON FOR ASSESSMENT:   Consult Assessment of nutrition requirement/status  ASSESSMENT:   87 y.o. female with a known history of essential hypertension and hypothyroidism who presented to the emergency room altered mental status, hallucinations, paranoia and  increasing difficulty with anxiety.   Met with pt in room today. Pt reports good appetite and oral intake at baseline; pt reports " I eat what I can because I know I need to". Pt's main complaint today is that she feels constipated and needs to have a BM. Pt reports that she ate almost all of her breakfast today and that she ate a tomato sandwich for lunch. Pt is documented to be eating 75-100% of meals in hospital. Pt reports that she enjoys strawberry Ensure and that she drinks this at home. RD will add supplements and MVI to help pt meet her estimated needs. RD will change pt to a mechanical soft diet as she reports that she needs soft foods to eat. Per chart, pt appears weight stable at baseline.   Medications reviewed and include: colace, lovenox, synthroid, MVI, protonix, senokot   Labs reviewed: creat 1.02(H)  NUTRITION - FOCUSED PHYSICAL EXAM:  Flowsheet Row Most Recent Value  Orbital Region Severe depletion  Upper Arm Region Severe depletion  Thoracic and Lumbar  Region Moderate depletion  Buccal Region Severe depletion  Temple Region Severe depletion  Clavicle Bone Region Severe depletion  Clavicle and Acromion Bone Region Severe depletion  Scapular Bone Region Severe depletion  Dorsal Hand Severe depletion  Patellar Region Severe depletion  Anterior Thigh Region Moderate depletion  Posterior Calf Region Moderate depletion  Edema (RD Assessment) None  Hair Reviewed  Eyes Reviewed  Mouth Reviewed  Skin Reviewed  Nails Reviewed     Diet Order:   Diet Order            DIET DYS 3 Room service appropriate? No; Fluid consistency: Thin  Diet effective now                EDUCATION NEEDS:   Education needs have been addressed  Skin:  Skin Assessment: Reviewed RN Assessment  Last BM:  2/3- type 1  Height:   Ht Readings from Last 1 Encounters:  10/27/20 '5\' 5"'  (1.651 m)    Weight:   Wt Readings from Last 1 Encounters:  11/02/20 57.5 kg    Ideal Body Weight:  56.8 kg  BMI:  Body mass index is 21.09 kg/m.  Estimated Nutritional Needs:   Kcal:  1400-1600kcal/day  Protein:  70-80g/day  Fluid:  1.4-1.6L/day  Koleen Distance MS, RD, LDN Please refer to [facility] for RD and/or RD on-call/weekend/after hours pager

## 2020-11-03 NOTE — Progress Notes (Signed)
Mobility Specialist - Progress Note   11/03/20 1129  Mobility  Activity Contraindicated/medical hold  Mobility performed by Mobility specialist    Pt working with PT at time of attempt. Will hold off and attempt session another date/time as time permits.    Filiberto Pinks Mobility Specialist 11/03/20, 11:30 AM

## 2020-11-03 NOTE — TOC Progression Note (Addendum)
Transition of Care [facility]) - Progression Note    Patient Details  Name: Nicole Ramsey MRN: [id number] Date of Birth: 01/12/1927  [facility] CM/SW Contact  Margarito Liner, LCSW Phone Number: 11/03/2020, 12:20 PM  Clinical Narrative:  Sherron Monday to son. They are leaning towards [facility]. Trying to figure out financial piece, durable POA, etc. Discussed potential for SNF but explained that at this time, PT only recommending home health. Son said patient wasn't using a walker PTA. Per PT, patient was seen walking around the nurses station today. She was shaky but steady without any loss of balance.  1:17 pm: PT and OT notes reviewed with son over the phone. Patient walked 240 feet min guard with PT today with no assistive device. He requested a call from PT to discuss her progress and to see if he could walk with her in the hall without a nurse. Sent secure chat to PT with request. Sent secure chat to OT to see if she'll have patient put on schedule for tomorrow per son request.  3:38 pm: Patient's son agreeable to home health at whichever ALF she ends up going to.  Expected Discharge Plan: Home/Self Care Barriers to Discharge: Unsafe home situation,Family Issues  Expected Discharge Plan and Services Expected Discharge Plan: Home/Self Care                                               Social Determinants of Health (SDOH) Interventions    Readmission Risk Interventions No flowsheet data found.

## 2020-11-04 DIAGNOSIS — F331 Major depressive disorder, recurrent, moderate: Secondary | ICD-10-CM | POA: Diagnosis not present

## 2020-11-04 LAB — BASIC METABOLIC PANEL
Anion gap: 7 (ref 5–15)
BUN: 17 mg/dL (ref 8–23)
CO2: 26 mmol/L (ref 22–32)
Calcium: 8.9 mg/dL (ref 8.9–10.3)
Chloride: 100 mmol/L (ref 98–111)
Creatinine, Ser: 0.84 mg/dL (ref 0.44–1.00)
GFR, Estimated: >60 mL/min (ref >60)
Glucose, Bld: 123 mg/dL — ABNORMAL HIGH (ref 70–99)
Potassium: 3.9 mmol/L (ref 3.5–5.1)
Sodium: 133 mmol/L — ABNORMAL LOW (ref 135–145)

## 2020-11-04 LAB — CBC
HCT: 36.8 % (ref 36.0–46.0)
Hemoglobin: 11.9 g/dL — ABNORMAL LOW (ref 12.0–15.0)
MCH: 28.7 pg (ref 26.0–34.0)
MCHC: 32.3 g/dL (ref 30.0–36.0)
MCV: 88.7 fL (ref 80.0–100.0)
Platelets: 193 10*3/uL (ref 150–400)
RBC: 4.15 MIL/uL (ref 3.87–5.11)
RDW: 15.1 % (ref 11.5–15.5)
WBC: 5 10*3/uL (ref 4.0–10.5)
nRBC: 0 % (ref 0.0–0.2)

## 2020-11-04 LAB — PHOSPHORUS: Phosphorus: 3.4 mg/dL (ref 2.5–4.6)

## 2020-11-04 LAB — MAGNESIUM: Magnesium: 2.1 mg/dL (ref 1.7–2.4)

## 2020-11-04 LAB — GLUCOSE, CAPILLARY: Glucose-Capillary: 95 mg/dL (ref 70–99)
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
## 2020-11-05 DIAGNOSIS — F0281 Dementia in other diseases classified elsewhere with behavioral disturbance: Secondary | ICD-10-CM | POA: Diagnosis not present

## 2020-11-05 DIAGNOSIS — R55 Syncope and collapse: Secondary | ICD-10-CM | POA: Diagnosis not present

## 2020-11-05 DIAGNOSIS — F331 Major depressive disorder, recurrent, moderate: Secondary | ICD-10-CM | POA: Diagnosis not present

## 2020-11-05 DIAGNOSIS — G309 Alzheimer's disease, unspecified: Secondary | ICD-10-CM | POA: Diagnosis not present

## 2020-11-05 LAB — BASIC METABOLIC PANEL
Anion gap: 8 (ref 5–15)
BUN: 18 mg/dL (ref 8–23)
CO2: 27 mmol/L (ref 22–32)
Calcium: 9.2 mg/dL (ref 8.9–10.3)
Chloride: 99 mmol/L (ref 98–111)
Creatinine, Ser: 0.8 mg/dL (ref 0.44–1.00)
GFR, Estimated: >60 mL/min (ref >60)
Glucose, Bld: 101 mg/dL — ABNORMAL HIGH (ref 70–99)
Potassium: 4.2 mmol/L (ref 3.5–5.1)
Sodium: 134 mmol/L — ABNORMAL LOW (ref 135–145)

## 2020-11-05 LAB — GLUCOSE, CAPILLARY: Glucose-Capillary: 105 mg/dL — ABNORMAL HIGH (ref 70–99)

## 2020-11-05 MED ORDER — ALPRAZOLAM 0.5 MG PO TABS
1.0000 mg | ORAL_TABLET | Freq: Two times a day (BID) | ORAL | Status: DC | PRN
  Administered 2020-11-05 – 2020-11-09 (×3): 1 mg via ORAL
  Filled 2020-11-05 (×4): qty 2

## 2020-11-05 NOTE — Progress Notes (Signed)
Mobility Specialist - Progress Note   11/05/20 1200  Mobility  Activity Ambulated in hall  Level of Assistance Standby assist, set-up cues, supervision of patient - no hands on  Assistive Device None  Distance Ambulated (ft) 200 ft  Mobility Response Tolerated well  Mobility performed by Mobility specialist  $Mobility charge 1 Mobility    Pre-mobility: 62 HR, 97% SpO2 During mobility: 66 HR, 97% SpO2 Post-mobility: 65 HR, 98% SpO2   Pt was sitting in bed upon arrival utilizing room air. Pt agreed to session. Pt denied pain and fatigue, but does c/o nausea. Pt Aox4 this date. Pt able to get EOB independently where she begin c/o mild dizziness. A seated rest break was taken to resolve issue. Pt stood at bedside with supervision. Pt needs extra time to sustain balance once upright, but does progress to good standing-balance. Pt ambulated 200' total in room/hallway without use of AD. No LOB noted. After ~100', noted pt beginning to ambulate with slower and shortened steps. Pt begin c/o mild dizziness again and an extensive seated rest break was taken. Pt denied weakness in LE and SOB. Pt requested a second seated rest break after an additional 40'. Pt continues denying SOB and fatigue. Pt continued ambulation back to room and returned supine with all needs in reach. Bed alarm set.    Nicole Ramsey Mobility Specialist 11/05/20, 1:10 PM'
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
## 2020-11-06 DIAGNOSIS — F331 Major depressive disorder, recurrent, moderate: Secondary | ICD-10-CM | POA: Diagnosis not present

## 2020-11-06 DIAGNOSIS — G309 Alzheimer's disease, unspecified: Secondary | ICD-10-CM | POA: Diagnosis not present

## 2020-11-06 DIAGNOSIS — R627 Adult failure to thrive: Secondary | ICD-10-CM

## 2020-11-06 DIAGNOSIS — R55 Syncope and collapse: Secondary | ICD-10-CM | POA: Diagnosis not present

## 2020-11-06 LAB — GLUCOSE, CAPILLARY: Glucose-Capillary: 101 mg/dL — ABNORMAL HIGH (ref 70–99)
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
## 2020-11-06 NOTE — Care Management Important Message (Signed)
Important Message  Patient Details  Name: Nicole Ramsey MRN: [id number] Date of Birth: 02-Sep-1927   Medicare Important Message Given:  Yes     Johnell Comings 11/06/2020, 10:42 AM

## 2020-11-06 NOTE — Progress Notes (Signed)
OT Cancellation Note  Patient Details Name: Nicole Ramsey MRN: [id number] DOB: [birth year]   Cancelled Treatment:    Reason Eval/Treat Not Completed: Other (comment). Pt working with PT upon attempt. Will re-attempt at later time as pt is available.  Richrd Prime, MPH, MS, OTR/L ascom [phone] 11/06/20, 10:34 AM

## 2020-11-06 NOTE — Progress Notes (Signed)
Occupational Therapy Treatment Patient Details Name: Nicole Ramsey MRN: [id number] DOB: 1927/07/04 Today's Date: 11/06/2020    History of present illness 87 y.o. female with a known history of essential hypertension and hypothyroidism, who presented to the emergency room with acute onset of failure to thrive with recent altered mental status with hallucination and paranoia increasing difficulty with anxiety. She was admitted under medical service for presumed syncopal work-up.   OT comments  Pt seen for OT tx this date. Pt pleasant, however appears more subdued, tired today than previous sessions. Slower to reply to questions. Pt declined OOB reporting recently up to [facility] on her own, without staff despite attempting to call for help. BSC emptied of urine and moderate sized BM. Pt instructed in falls prevention, use of call bell and room phone. RN notified. Pt able to reposition herself in bed with bed flat to improve her overall upright positioning for snack once head elevated. Pt provided with apple juice and crackers per her request. Pt demonstrates mild decline in overall cognition and safety/problem solving. Given recent BP issues and cognition along with other impairments, updating follow up recommendation to SNF for short term rehab.    Follow Up Recommendations  SNF    Equipment Recommendations  3 in 1 bedside commode    Recommendations for Other Services      Precautions / Restrictions Precautions Precautions: Fall Restrictions Weight Bearing Restrictions: No Other Position/Activity Restrictions: Monitor orthostatics       Mobility Bed Mobility Overal bed mobility: Modified Independent             General bed mobility comments: with bed repositioned, pt able to self adjust to improve overall positioning and HOB elevated in anticipation of drinking/eating  Transfers Overall transfer level: Needs assistance Equipment used: None Transfers: Sit to/from Stand Sit to  Stand: Min guard;Min assist         General transfer comment: pt declined    Balance Overall balance assessment: Needs assistance Sitting-balance support: No upper extremity supported;Feet supported Sitting balance-Leahy Scale: Good Sitting balance - Comments: steady sitting reaching within BOS   Standing balance support: No upper extremity supported Standing balance-Leahy Scale: Poor Standing balance comment: pt with intermittent posterior lean in standing requiring min assist for balance                           ADL either performed or assessed with clinical judgement   ADL Overall ADL's : Needs assistance/impaired Eating/Feeding: Set up                                           Vision Baseline Vision/History: Wears glasses Wears Glasses: At all times     Perception     Praxis      Cognition Arousal/Alertness: Awake/alert Behavior During Therapy: Flat affect (flat affect today--appearing tired) Overall Cognitive Status: Within Functional Limits for tasks assessed                                 General Comments: grossly WFL, A&Ox4, slower to respond to questions        Exercises Other Exercises Other Exercises: pt instructed in falls prevention strategies, use of call bell, safety   Shoulder Instructions       General Comments  Pertinent Vitals/ Pain       Pain Assessment: No/denies pain  Home Living                                          Prior Functioning/Environment              Frequency  Min 1X/week        Progress Toward Goals  OT Goals(current goals can now be found in the care plan section)  Progress towards OT goals: Not progressing toward goals - comment  Acute Rehab OT Goals Patient Stated Goal: to improve strength and mobility OT Goal Formulation: With patient Time For Goal Achievement: 11/14/20 Potential to Achieve Goals: Fair  Plan Discharge plan needs  to be updated;Frequency remains appropriate    Co-evaluation                 AM-PAC OT "6 Clicks" Daily Activity     Outcome Measure   Help from another person eating meals?: A Little Help from another person taking care of personal grooming?: A Little Help from another person toileting, which includes using toliet, bedpan, or urinal?: A Little Help from another person bathing (including washing, rinsing, drying)?: A Little Help from another person to put on and taking off regular upper body clothing?: A Little Help from another person to put on and taking off regular lower body clothing?: A Little 6 Click Score: 18    End of Session    OT Visit Diagnosis: Unsteadiness on feet (R26.81)   Activity Tolerance Patient tolerated treatment well   Patient Left in bed;with call bell/phone within reach;with bed alarm set   Nurse Communication          Time: 3235-5732 OT Time Calculation (min): 14 min  Charges: OT General Charges $OT Visit: 1 Visit OT Treatments $Self Care/Home Management : 8-22 mins  Richrd Prime, MPH, MS, OTR/L ascom [phone] 11/06/20, 3:25 PM

## 2020-11-06 NOTE — [place] FL2 (Signed)
[place] MEDICAID FL2 LEVEL OF CARE SCREENING TOOL     IDENTIFICATION  Patient Name: Nicole Ramsey Birthdate: [birth year] Sex: female Admission Date (Current Location): 10/27/2020  [place] and [place] Number:  [address] [id number] Facility and Address:  [facility], [address], [place], [place] [address]      Provider Number: [id number]  Attending Physician Name and Address:  Marrion Coy, MD  Relative Name and Phone Number:  Feleshia Zund[facility]), [phone]    Current Level of Care: Hospital Recommended Level of Care: Skilled Nursing Facility Prior Approval Number:    Date Approved/Denied:   PASRR Number: Manual review  Discharge Plan: SNF    Current Diagnoses: Patient Active Problem List   Diagnosis Date Noted  . Protein-calorie malnutrition, severe 11/03/2020  . Syncope 10/30/2020  . Depression, major, recurrent, moderate (HCC) 10/27/2020  . 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 (HCC) 10/27/2020  . Hypothyroid 10/27/2020  . Hypertension 10/27/2020    Orientation RESPIRATION BLADDER Height & Weight     Self,Time,Situation,Place  Normal Continent Weight: 128 lb 1.4 oz (58.1 kg) Height:  5\' 5"  (165.1 cm)  BEHAVIORAL SYMPTOMS/MOOD NEUROLOGICAL BOWEL NUTRITION STATUS   (None)  (Dementia) Continent Diet (DYS 3)  AMBULATORY STATUS COMMUNICATION OF NEEDS Skin   Limited Assist Verbally Bruising                       Personal Care Assistance Level of Assistance  Bathing,Feeding,Dressing Bathing Assistance: Limited assistance Feeding assistance: Limited assistance Dressing Assistance: Limited assistance   Functional Limitations Info  Sight,Hearing,Speech Sight Info: Adequate Hearing Info: Adequate Speech Info: Adequate    SPECIAL CARE FACTORS FREQUENCY  PT (By licensed PT),OT (By licensed OT)     PT Frequency: 5 x week OT Frequency: 5 x week            Contractures Contractures Info: Not  present    Additional Factors Info  Code Status,Allergies,Psychotropic Code Status Info: DNR Allergies Info: Aspirin Psychotropic Info: Depression: Lexapro 5 mg PO daily, Seroquel 25 mg PO QHS         Current Medications (11/06/2020):  This is the current hospital active medication list Current Facility-Administered Medications  Medication Dose Route Frequency Provider Last Rate Last Admin  . acetaminophen (TYLENOL) tablet 650 mg  650 mg Oral Q6H PRN Mansy, Jan A, MD       Or  . acetaminophen (TYLENOL) suppository 650 mg  650 mg Rectal Q6H PRN Mansy, Jan A, MD      . ALPRAZolam 01/04/2021) tablet 1 mg  1 mg Oral BID PRN Prudy Feeler, MD   1 mg at 11/05/20 2257  . alum & mag hydroxide-simeth (MAALOX/MYLANTA) 200-200-20 MG/5ML suspension 30 mL  30 mL Oral Q6H PRN Mansy, Jan A, MD      . amLODipine (NORVASC) tablet 10 mg  10 mg Oral Daily Clapacs, John T, MD   10 mg at 11/06/20 1022  . atenolol (TENORMIN) tablet 50 mg  50 mg Oral Daily Clapacs, 01/04/21, MD   50 mg at 11/06/20 1023  . docusate sodium (COLACE) capsule 100 mg  100 mg Oral BID 01/04/21, MD   100 mg at 11/06/20 1022  . enoxaparin (LOVENOX) injection 40 mg  40 mg Subcutaneous Q24H Mansy, Jan A, MD   40 mg at 11/06/20 1023  . escitalopram (LEXAPRO) tablet 5 mg  5 mg Oral Daily Clapacs, John T, MD   5 mg at 11/06/20 1022  .  feeding supplement (ENSURE ENLIVE / ENSURE PLUS) liquid 237 mL  237 mL Oral BID BM Cipriano Bunker, MD   237 mL at 11/06/20 1023  . levothyroxine (SYNTHROID) tablet 75 mcg  75 mcg Oral Q0600 Arnetha Courser, MD   75 mcg at 11/06/20 0651  . magnesium hydroxide (MILK OF MAGNESIA) suspension 30 mL  30 mL Oral Daily PRN Mansy, Jan A, MD   30 mL at 11/01/20 1413  . multivitamin with minerals tablet 1 tablet  1 tablet Oral Daily Cipriano Bunker, MD   1 tablet at 11/06/20 1022  . ondansetron (ZOFRAN) tablet 4 mg  4 mg Oral Q6H PRN Mansy, Jan A, MD       Or  . ondansetron [facility]) injection 4 mg  4 mg Intravenous Q6H PRN  Mansy, Jan A, MD      . pantoprazole (PROTONIX) EC tablet 40 mg  40 mg Oral Daily Clapacs, Jackquline Denmark, MD   40 mg at 11/06/20 1022  . QUEtiapine (SEROQUEL) tablet 25 mg  25 mg Oral QHS Clapacs, Jackquline Denmark, MD   25 mg at 11/05/20 2256  . senna (SENOKOT) tablet 17.2 mg  2 tablet Oral QHS Joylene Draft, NP   17.2 mg at 11/05/20 2256  . sodium chloride flush (NS) 0.9 % injection 3 mL  3 mL Intravenous Q12H Mansy, Jan A, MD   3 mL at 11/06/20 1029  . traZODone (DESYREL) tablet 25 mg  25 mg Oral QHS PRN Mansy, Jan A, MD   25 mg at 11/04/20 2149     Discharge Medications: Please see discharge summary for a list of discharge medications.  Relevant Imaging Results:  Relevant Lab Results:   Additional Information SS#: [id number]. Son Annette Stable working on ALF placement.  Margarito Liner, LCSW

## 2020-11-06 NOTE — Clinical Social Work Note (Signed)
RE: Nicole Ramsey Date of Birth: 23-Jun-1927 Date: 11/06/2020   To Whom It May Concern:  Please be advised that the above-named patient will require a short-term nursing home stay - anticipated 30 days or less for rehabilitation and strengthening.  The plan is for return home.

## 2020-11-06 NOTE — TOC Progression Note (Addendum)
Transition of Care [facility]) - Progression Note    Patient Details  Name: Nicole Ramsey MRN: [id number] Date of Birth: [birth year]  Transition of Care [facility]) CM/SW Contact  Margarito Liner, LCSW Phone Number: 11/06/2020, 12:52 PM  Clinical Narrative: Based on patient's performance today, PT recommending SNF. Sent out referral to [facility] facilities.    4:28 pm: Received call from son and daughter-in-law. They are still hoping for [facility] but said they are waiting on a call back regarding the financial piece. CSW notified them of SNF recommendation today. Asked them to review facility scores for those that are still pending. Called Stacy at [facility]. They do not currently have any beds but patient is on the waiting list. She said she has already made son aware of this.  Expected Discharge Plan: Home/Self Care Barriers to Discharge: Unsafe home situation,Family Issues  Expected Discharge Plan and Services Expected Discharge Plan: Home/Self Care                                               Social Determinants of Health (SDOH) Interventions    Readmission Risk Interventions No flowsheet data found.

## 2020-11-06 NOTE — Progress Notes (Signed)
Physical Therapy Treatment Patient Details Name: Nicole Ramsey MRN: [id number] DOB: [birth year] Today's Date: 11/06/2020    History of Present Illness 87 y.o. female with a known history of essential hypertension and hypothyroidism, who presented to the emergency room with acute onset of failure to thrive with recent altered mental status with hallucination and paranoia increasing difficulty with anxiety. She was admitted under medical service for presumed syncopal work-up.    PT Comments    Pt resting in bed upon PT arrival; agreeable to PT session.  Pt smiling at times but more of a flat affect today (pt appearing tired).  Pt noted to have c/o dizziness with mobility specialist yesterday so orthostatics taken today.  Supine BP 141/48; sitting BP 125/56; standing BP after initial stand 111/55 (pt reporting dizziness and appearing unsteady and with intermittent posterior lean requiring assist for balance); and standing BP at 3 minutes 123/61 (pt still reporting dizziness but symptoms resolved once sitting down).  Nurse notified of pt's BP and symptoms.  Pt assisted with toileting on BSC per pt request but deferred ambulation for safety d/t pt's symptoms in standing (and balance concerns).  Will continue to focus on strengthening, balance, and progressive functional mobility during hospitalization.  D/t current assist levels and balance concerns, pt does not appear safe to discharge home alone.  PT discharge recommendations updated to SNF: TOC notified.   Follow Up Recommendations  SNF     Equipment Recommendations   (pt reports having RW at home)    Recommendations for Other Services       Precautions / Restrictions Precautions Precautions: Fall Restrictions Weight Bearing Restrictions: No Other Position/Activity Restrictions: Monitor orthostatics    Mobility  Bed Mobility Overal bed mobility: Modified Independent             General bed mobility comments: Semi-supine to/from  sitting edge of bed with increased effort to perform on own  Transfers Overall transfer level: Needs assistance Equipment used: None Transfers: Sit to/from Stand Sit to Stand: Min guard;Min assist         General transfer comment: pt shaky standing from bed and from [facility] requiring CGA to min assist to steady  Ambulation/Gait Ambulation/Gait assistance: Min guard;Min assist Gait Distance (Feet):  (3 feet x2 (bed to/from [facility])) Assistive device: None   Gait velocity: decreased   General Gait Details: pt appearing shaky and unsteady requiring CGA to min assist to steady and for safety   Stairs             Wheelchair Mobility    Modified Rankin (Stroke Patients Only)       Balance Overall balance assessment: Needs assistance Sitting-balance support: No upper extremity supported;Feet supported Sitting balance-Leahy Scale: Good Sitting balance - Comments: steady sitting reaching within BOS   Standing balance support: No upper extremity supported Standing balance-Leahy Scale: Poor Standing balance comment: pt with intermittent posterior lean in standing requiring min assist for balance                            Cognition Arousal/Alertness: Awake/alert Behavior During Therapy:  (pt smiling at times but more of a flat affect today--appearing tired) Overall Cognitive Status: Within Functional Limits for tasks assessed                                 General Comments: Oriented to person, place, general situation, and  reported it was February 2022.      Exercises      General Comments   Nursing cleared pt for participation in physical therapy.  Pt agreeable to PT session.      Pertinent Vitals/Pain Pain Assessment: No/denies pain    Home Living                      Prior Function            PT Goals (current goals can now be found in the care plan section) Acute Rehab PT Goals Patient Stated Goal: to improve strength and  mobility PT Goal Formulation: With patient Time For Goal Achievement: 11/11/20 Potential to Achieve Goals: Good Progress towards PT goals: Progressing toward goals    Frequency    Min 2X/week      PT Plan Discharge plan needs to be updated (TOC notified)    Co-evaluation              AM-PAC PT "6 Clicks" Mobility   Outcome Measure  Help needed turning from your back to your side while in a flat bed without using bedrails?: None Help needed moving from lying on your back to sitting on the side of a flat bed without using bedrails?: None Help needed moving to and from a bed to a chair (including a wheelchair)?: A Little Help needed standing up from a chair using your arms (e.g., wheelchair or bedside chair)?: A Little Help needed to walk in hospital room?: A Little Help needed climbing 3-5 steps with a railing? : A Little 6 Click Score: 20    End of Session Equipment Utilized During Treatment: Gait belt Activity Tolerance: Other (comment) (Limited d/t dizziness) Patient left: in bed;with call bell/phone within reach;with bed alarm set Nurse Communication: Mobility status;Precautions;Other (comment) (pt's BP vitals and symptoms) PT Visit Diagnosis: Unsteadiness on feet (R26.81);Muscle weakness (generalized) (M62.81)     Time: 4782-9562 PT Time Calculation (min) (ACUTE ONLY): 26 min  Charges:  $Therapeutic Exercise: 8-22 mins $Therapeutic Activity: 8-22 mins                    Hendricks Limes, PT 11/06/20, 12:00 PM

## 2020-11-06 NOTE — Progress Notes (Signed)
Palliative-   Signed MOST form and Yellow DNR form scanned and sent to [email or web address] to be uploaded into [facility].   Ocie Bob, AGNP-C Palliative Medicine  Please call Palliative Medicine team phone with any questions [phone]. For individual providers please see AMION.  No charge

## 2020-11-07 DIAGNOSIS — R55 Syncope and collapse: Secondary | ICD-10-CM | POA: Diagnosis not present

## 2020-11-07 DIAGNOSIS — E43 Unspecified severe protein-calorie malnutrition: Secondary | ICD-10-CM

## 2020-11-07 DIAGNOSIS — F331 Major depressive disorder, recurrent, moderate: Secondary | ICD-10-CM | POA: Diagnosis not present

## 2020-11-07 LAB — GLUCOSE, CAPILLARY
Glucose-Capillary: 123 mg/dL — ABNORMAL HIGH (ref 70–99)
Glucose-Capillary: 113 mg/dL — ABNORMAL HIGH (ref 70–99)

## 2020-11-07 NOTE — Progress Notes (Signed)
Physical Therapy Note  Therapist received message from charge nurse that pt's daughter in law Nicole Ramsey) requesting phone call from therapist.  Therapist spoke directly with pt first (pt oriented to person, place, February 2022, and general situation) and pt clearing therapist to speak with daughter in law Nicole Ramsey).  PT answered pt's daughter in law's questions regarding current therapy recommendations (SNF) and reasoning behind current recommendations (unsteadiness/balance concerns).  Pt's daughter in law asking if pt was medically ready to discharge and when pt was going to discharge: therapist referred pt's daughter in law to pt's attending MD and TOC to answer those questions.  Daughter in law asking if pt was participating in therapy and therapist reported that pt was participatory in physical therapy.  Pt's daughter in law then requesting information regarding how pt was doing with OT and requesting OT call her: therapist told pt's daughter in law that she would notify OT (therapist notified OT after finishing phone call with daughter in law this morning).  TOC updated regarding above conversation.  Nicole Ramsey, PT 11/07/20, 1:55 PM

## 2020-11-07 NOTE — Progress Notes (Signed)
Occupational Therapy Treatment Patient Details Name: Nicole Ramsey MRN: [id number] DOB: [birth year] Today's Date: 11/07/2020    History of present illness 87 y.o. female with a known history of essential hypertension and hypothyroidism, who presented to the emergency room with acute onset of failure to thrive with recent altered mental status with hallucination and paranoia increasing difficulty with anxiety. She was admitted under medical service for presumed syncopal work-up.   OT comments  Pt seen for OT tx this date. Pt pleasant and agreeable to session. Pt performed bed mobility with CGA, STS ADL transfer with CGA. Pt stood at sink to perform grooming tasks with set up and supervision. No LOB. Set up for gown change. Pt ambulated around the bed and to the recliner while negotiating obstacles in the room without LOB or overt difficulty. Pt left in recliner, all needs in reach. RN notified. Pt progressing towards goals, improved overall affect and functional performance today. With pt's permission, spoke with the daughter in law over the phone (DIL requested to speak with OT when talking to PT earlier this date). DIL appreciative of update. Questions about discharging timing deferred to MD/case mgt. Pt continues to benefit from skilled OT services. Given mild fluctuations in ADL performance and ADL mobility, recommendation updated - follow MD's recommendation for discharge plan and follow-up therapies.    Follow Up Recommendations  Other (comment) Steele Berg MD's recommendation for discharge plan and follow-up therapies)    Equipment Recommendations  3 in 1 bedside commode    Recommendations for Other Services      Precautions / Restrictions Precautions Precautions: Fall Restrictions Weight Bearing Restrictions: No Other Position/Activity Restrictions: Monitor orthostatics       Mobility Bed Mobility Overal bed mobility: Needs Assistance Bed Mobility: Supine to Sit     Supine to  sit: HOB elevated;Min guard     General bed mobility comments: cues for sequencing initially  Transfers Overall transfer level: Needs assistance Equipment used: None Transfers: Sit to/from Stand Sit to Stand: Min guard              Balance Overall balance assessment: Needs assistance Sitting-balance support: No upper extremity supported;Feet supported Sitting balance-Leahy Scale: Good     Standing balance support: No upper extremity supported;During functional activity Standing balance-Leahy Scale: Fair Standing balance comment: Pt tolerated static standing at sink to perform grooming tasks                           ADL either performed or assessed with clinical judgement   ADL Overall ADL's : Needs assistance/impaired     Grooming: Wash/dry face;Standing;Supervision/safety;Oral care;Applying deodorant;Set up                               Functional mobility during ADLs: Min guard;Supervision/safety       Vision Baseline Vision/History: Wears glasses Wears Glasses: At all times     Perception     Praxis      Cognition Arousal/Alertness: Awake/alert Behavior During Therapy: [facility] for tasks assessed/performed Overall Cognitive Status: Within Functional Limits for tasks assessed                                 General Comments: pleasant and cooperative, alert and oriented, followed commands well. Did at one point reference hearing from friends that they could see her outside  her window. OT provided active listening and reassurance.        Exercises Other Exercises Other Exercises: Bed mobility, grooming tasks standing at sink, gown change, walked to recliner   Shoulder Instructions       General Comments pt endorsed feeling "a little fuzzy" at all times, not influenced by positioning this date    Pertinent Vitals/ Pain       Pain Assessment: No/denies pain  Home Living                                           Prior Functioning/Environment              Frequency  Min 1X/week        Progress Toward Goals  OT Goals(current goals can now be found in the care plan section)  Progress towards OT goals: Progressing toward goals  Acute Rehab OT Goals Patient Stated Goal: to improve strength and mobility OT Goal Formulation: With patient Time For Goal Achievement: 11/14/20 Potential to Achieve Goals: Good  Plan Discharge plan needs to be updated;Frequency remains appropriate    Co-evaluation                 AM-PAC OT "6 Clicks" Daily Activity     Outcome Measure   Help from another person eating meals?: None Help from another person taking care of personal grooming?: A Little Help from another person toileting, which includes using toliet, bedpan, or urinal?: A Little Help from another person bathing (including washing, rinsing, drying)?: A Little Help from another person to put on and taking off regular upper body clothing?: None Help from another person to put on and taking off regular lower body clothing?: A Little 6 Click Score: 20    End of Session    OT Visit Diagnosis: Unsteadiness on feet (R26.81);Muscle weakness (generalized) (M62.81)   Activity Tolerance Patient tolerated treatment well   Patient Left in chair;with call bell/phone within reach;with chair alarm set   Nurse Communication Other (comment) (in recliner, all needs in reach)        Time: 7673-4193 OT Time Calculation (min): 24 min  Charges: OT General Charges $OT Visit: 1 Visit OT Treatments $Self Care/Home Management : 23-37 mins  Richrd Prime, MPH, MS, OTR/L ascom [phone] 11/07/20, 4:29 PM

## 2020-11-07 NOTE — TOC Progression Note (Signed)
Transition of Care [facility]) - Progression Note    Patient Details  Name: Nicole Ramsey MRN: [id number] Date of Birth: 02-26-1927  Transition [facility]) CM/SW Contact  Margarito Liner, LCSW Phone Number: 11/07/2020, 8:59 AM  Clinical Narrative:  Uploaded requested documents into [place] Must for PASARR review. No bed offers this morning. [facility] and [facility] have declined. [facility], [facility], and [facility] still pending.   Expected Discharge Plan: Home/Self Care Barriers to Discharge: Unsafe home situation,Family Issues  Expected Discharge Plan and Services Expected Discharge Plan: Home/Self Care                                               Social Determinants of Health (SDOH) Interventions    Readmission Risk Interventions No flowsheet data found.

## 2020-11-07 NOTE — TOC Progression Note (Signed)
Transition of Care [facility]) - Progression Note    Patient Details  Name: Nicole Ramsey MRN: [id number] Date of Birth: 03-18-1927  Transition of Care [facility]) CM/SW Contact  Margarito Liner, LCSW Phone Number: 11/07/2020, 3:34 PM  Clinical Narrative:  [facility], [address] [facility], [facility], and [facility] have declined a bed offer. Left voicemail for [facility] admissions coordinator asking him to review. Will have to start looking outside of [facility] if they cannot offer.   Expected Discharge Plan: Home/Self Care Barriers to Discharge: Unsafe home situation,Family Issues  Expected Discharge Plan and Services Expected Discharge Plan: Home/Self Care                                               Social Determinants of Health (SDOH) Interventions    Readmission Risk Interventions No flowsheet data found.
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
## 2020-11-08 DIAGNOSIS — R627 Adult failure to thrive: Secondary | ICD-10-CM | POA: Diagnosis not present

## 2020-11-08 DIAGNOSIS — R55 Syncope and collapse: Secondary | ICD-10-CM | POA: Diagnosis not present

## 2020-11-08 DIAGNOSIS — G309 Alzheimer's disease, unspecified: Secondary | ICD-10-CM | POA: Diagnosis not present

## 2020-11-08 DIAGNOSIS — F331 Major depressive disorder, recurrent, moderate: Secondary | ICD-10-CM | POA: Diagnosis not present

## 2020-11-08 LAB — GLUCOSE, CAPILLARY: Glucose-Capillary: 101 mg/dL — ABNORMAL HIGH (ref 70–99)

## 2020-11-08 NOTE — Progress Notes (Signed)
Nutrition Follow Up Note   DOCUMENTATION CODES:   Severe malnutrition in context of social or environmental circumstances  INTERVENTION:   Ensure Enlive po BID, each supplement provides 350 kcal and 20 grams of protein  Magic cup TID with meals, each supplement provides 290 kcal and 9 grams of protein  MVI daily   Dysphagia 3 diet   NUTRITION DIAGNOSIS:   Severe Malnutrition related to social / environmental circumstances (advanced age) as evidenced by severe fat depletion,severe muscle depletion.  GOAL:   Patient will meet greater than or equal to 90% of their needs  -progressing   MONITOR:   PO intake,Supplement acceptance,Labs,Weight trends,Skin,I & O's  ASSESSMENT:   87 y.o. female with a known history of essential hypertension and hypothyroidism who presented to the emergency room altered mental status, hallucinations, paranoia and  increasing difficulty with anxiety.   Pt continues to have good appetite and oral intake; pt eating 90-100% of meals and is drinking some Ensure supplements. Per chart, pt has remained weight stable since admit. Pt currently awaiting SNF placement.   Medications reviewed and include: colace, lovenox, synthroid, MVI, protonix, senokot   Labs reviewed: none recent  Diet Order:   Diet Order            DIET DYS 3 Room service appropriate? No; Fluid consistency: Thin  Diet effective now                EDUCATION NEEDS:   Education needs have been addressed  Skin:  Skin Assessment: Reviewed RN Assessment  Last BM:  2/7- TYPE 4  Height:   Ht Readings from Last 1 Encounters:  11/05/20 5\' 5"  (1.651 m)    Weight:   Wt Readings from Last 1 Encounters:  11/08/20 59.9 kg    Ideal Body Weight:  56.8 kg  BMI:  Body mass index is 21.98 kg/m.  Estimated Nutritional Needs:   Kcal:  1400-1600kcal/day  Protein:  70-80g/day  Fluid:  1.4-1.6L/day  01/06/21 MS, RD, LDN Please refer to [facility] for RD and/or RD  on-call/weekend/after hours pager
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
## 2020-11-08 NOTE — TOC Progression Note (Addendum)
Transition of Care [facility]) - Progression Note    Patient Details  Name: Nicole Ramsey MRN: [id number] Date of Birth: 03/20/1927  [facility] CM/SW Contact  Margarito Liner, LCSW Phone Number: 11/08/2020, 12:32 PM  Clinical Narrative: Called patient's son Annette Stable to let him know about bed offer from [facility] in [place]. Left another voicemail at [facility] to see if they would review referral. Son asked about memory care facilities. Explained that [facility] is probably the only SNF nearby with a locked unit. Reviewed CMS scores for these 3 facilities. He will discuss with his wife and one of them will call back.    12:55 pm: [facility] has also offered a bed. Son will let his wife know.  4:08 pm: Called daughter-in-law [phone]). She has called and spoken with admissions coordinator at [facility]. She would like to speak with admissions coordinator at [facility] but has not been able to reach anyone at the facility. CSW called admissions coordinator and provided her with daughter-in-law's phone number.  Expected Discharge Plan: Home/Self Care Barriers to Discharge: Unsafe home situation,Family Issues  Expected Discharge Plan and Services Expected Discharge Plan: Home/Self Care                                               Social Determinants of Health (SDOH) Interventions    Readmission Risk Interventions No flowsheet data found.

## 2020-11-08 NOTE — Progress Notes (Signed)
Physical Therapy Treatment Patient Details Name: Nicole Ramsey MRN: [id number] DOB: January 15, 1927 Today's Date: 11/08/2020    History of Present Illness 87 y.o. female with a known history of essential hypertension and hypothyroidism, who presented to the emergency room with acute onset of failure to thrive with recent altered mental status with hallucination and paranoia increasing difficulty with anxiety. She was admitted under medical service for presumed syncopal work-up.    PT Comments    Pt sleeping in bed upon PT arrival; woken with vc's and agreeable to PT session.  Performed orthostatic vital signs: BP supine 105/49, sitting BP 120/44, standing BP at 0 minutes 107/50, and standing BP at 3 minutes 104/63.  Pt reporting mild dizziness during session but not specific to any position change--pt reports medication related.  Pt then able to ambulate around nursing loop with CGA to min assist x1 d/t intermittent unsteadiness/loss of balance requiring assist to regain balance (pt reports feeling shaky and unsteady today--pt reports feeling better yesterday).  Will continue to focus on strengthening and progressive functional mobility during hospitalization.    Follow Up Recommendations  SNF     Equipment Recommendations   (pt reports having RW at home)    Recommendations for Other Services       Precautions / Restrictions Precautions Precautions: Fall Restrictions Weight Bearing Restrictions: No Other Position/Activity Restrictions: Monitor orthostatics    Mobility  Bed Mobility Overal bed mobility: Modified Independent Bed Mobility: Supine to Sit;Sit to Supine     Supine to sit: Modified independent (Device/Increase time) Sit to supine: Modified independent (Device/Increase time)   General bed mobility comments: no difficulties noted  Transfers Overall transfer level: Needs assistance Equipment used: None Transfers: Sit to/from Stand Sit to Stand: Min guard          General transfer comment: fairly strong stand but pt pushing B LE's against bed to stabilize/balance self (x2 trials from bed)  Ambulation/Gait Ambulation/Gait assistance: Min guard;Min assist Gait Distance (Feet): 200 Feet Assistive device: None   Gait velocity: decreased   General Gait Details: pt mildly shaky; intermittenty unsteady and with altered stepping noted requiring min assist for balance intermittently   Stairs             Wheelchair Mobility    Modified Rankin (Stroke Patients Only)       Balance Overall balance assessment: Needs assistance Sitting-balance support: No upper extremity supported;Feet supported Sitting balance-Leahy Scale: Good Sitting balance - Comments: steady sitting reaching within BOS   Standing balance support: No upper extremity supported;During functional activity Standing balance-Leahy Scale: Poor Standing balance comment: intermittent loss of balance with ambulation requiring min assist to steady                            Cognition Arousal/Alertness: Awake/alert Behavior During Therapy: WFL for tasks assessed/performed Overall Cognitive Status: Within Functional Limits for tasks assessed                                 General Comments: Oriented to person, place, general situation, and month/year      Exercises      General Comments  Pt agreeable to PT session.      Pertinent Vitals/Pain Pain Assessment: No/denies pain    Home Living  Prior Function            PT Goals (current goals can now be found in the care plan section) Acute Rehab PT Goals Patient Stated Goal: to improve strength and mobility PT Goal Formulation: With patient Time For Goal Achievement: 11/11/20 Potential to Achieve Goals: Good Progress towards PT goals: Progressing toward goals    Frequency    Min 2X/week      PT Plan Current plan remains appropriate    Co-evaluation               AM-PAC PT "6 Clicks" Mobility   Outcome Measure  Help needed turning from your back to your side while in a flat bed without using bedrails?: None Help needed moving from lying on your back to sitting on the side of a flat bed without using bedrails?: None Help needed moving to and from a bed to a chair (including a wheelchair)?: A Little Help needed standing up from a chair using your arms (e.g., wheelchair or bedside chair)?: A Little Help needed to walk in hospital room?: A Little Help needed climbing 3-5 steps with a railing? : A Little 6 Click Score: 20    End of Session Equipment Utilized During Treatment: Gait belt Activity Tolerance: Patient tolerated treatment well Patient left: in bed;with call bell/phone within reach;with bed alarm set Nurse Communication: Mobility status;Precautions (via white board) PT Visit Diagnosis: Unsteadiness on feet (R26.81);Muscle weakness (generalized) (M62.81)     Time: 4193-7902 PT Time Calculation (min) (ACUTE ONLY): 31 min  Charges:  $Gait Training: 8-22 mins $Therapeutic Exercise: 8-22 mins                    Hendricks Limes, PT 11/08/20, 12:33 PM

## 2020-11-09 DIAGNOSIS — R55 Syncope and collapse: Secondary | ICD-10-CM | POA: Diagnosis not present

## 2020-11-09 DIAGNOSIS — F331 Major depressive disorder, recurrent, moderate: Secondary | ICD-10-CM | POA: Diagnosis not present

## 2020-11-09 DIAGNOSIS — G309 Alzheimer's disease, unspecified: Secondary | ICD-10-CM | POA: Diagnosis not present

## 2020-11-09 DIAGNOSIS — R627 Adult failure to thrive: Secondary | ICD-10-CM | POA: Diagnosis not present

## 2020-11-09 LAB — RESP PANEL BY RT-PCR (FLU A&B, COVID) ARPGX2
Influenza A by PCR: NEGATIVE
Influenza B by PCR: NEGATIVE
SARS Coronavirus 2 by RT PCR: NEGATIVE

## 2020-11-09 LAB — GLUCOSE, CAPILLARY
Glucose-Capillary: 100 mg/dL — ABNORMAL HIGH (ref 70–99)
Glucose-Capillary: 90 mg/dL (ref 70–99)

## 2020-11-09 MED ORDER — LACTULOSE 10 GM/15ML PO SOLN
20.0000 g | Freq: Once | ORAL | Status: AC
  Administered 2020-11-09: 20 g via ORAL
  Filled 2020-11-09: qty 30

## 2020-11-09 NOTE — TOC Progression Note (Addendum)
Transition of Care [facility]) - Progression Note    Patient Details  Name: Nicole Ramsey MRN: [id number] Date of Birth: [birth year]  [facility]) CM/SW Contact  Margarito Liner, LCSW Phone Number: 11/09/2020, 10:46 AM  Clinical Narrative:  [facility] has offered a bed. Called daughter-in-law to notify and reviewed CMS scores. She will call facility for information on their facility. She also asked about [facility] and had left a message for the administrator. CSW called and was able to speak with admissions coordinator today. He will review referral and call back.   4:24 pm: Plan for discharge to [facility] tomorrow. Patient is aware and agreeable. Started English as a second language teacher. Asked MD to order a COVID test.  Expected Discharge Plan: Home/Self Care Barriers to Discharge: Unsafe home situation,Family Issues  Expected Discharge Plan and Services Expected Discharge Plan: Home/Self Care                                               Social Determinants of Health (SDOH) Interventions    Readmission Risk Interventions No flowsheet data found.

## 2020-11-09 NOTE — Care Management Important Message (Signed)
Important Message  Patient Details  Name: Nicole Ramsey MRN: [id number] Date of Birth: [birth year]   Medicare Important Message Given:  Yes     Nicole Ramsey 11/09/2020, 2:02 PM
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
## 2020-11-10 DIAGNOSIS — E43 Unspecified severe protein-calorie malnutrition: Secondary | ICD-10-CM | POA: Diagnosis not present

## 2020-11-10 DIAGNOSIS — F331 Major depressive disorder, recurrent, moderate: Secondary | ICD-10-CM | POA: Diagnosis not present

## 2020-11-10 DIAGNOSIS — R55 Syncope and collapse: Secondary | ICD-10-CM | POA: Diagnosis not present

## 2020-11-10 DIAGNOSIS — Z23 Encounter for immunization: Secondary | ICD-10-CM | POA: Diagnosis present

## 2020-11-10 LAB — GLUCOSE, CAPILLARY
Glucose-Capillary: 107 mg/dL — ABNORMAL HIGH (ref 70–99)
Glucose-Capillary: 93 mg/dL (ref 70–99)

## 2020-11-10 MED ORDER — ALPRAZOLAM 1 MG PO TABS: 0.5000 mg | ORAL_TABLET | Freq: Two times a day (BID) | ORAL | 0 refills | Status: DC | PRN

## 2020-11-10 MED ORDER — ENSURE ENLIVE PO LIQD: 237.0000 mL | Freq: Two times a day (BID) | ORAL | 12 refills | Status: DC

## 2020-11-10 NOTE — TOC Progression Note (Addendum)
Transition of Care [facility]) - Progression Note    Patient Details  Name: Nicole Ramsey MRN: [id number] Date of Birth: August 17, 1927  Transition of Care [facility]) CM/SW Contact  Margarito Liner, LCSW Phone Number: 11/10/2020, 8:24 AM  Clinical Narrative: Insurance authorization approved: [id number]. COVID negative. [facility] admissions coordinator is aware. They will accept her PASARR pending. Admissions coordinator emailed daughter-in-law admissions paperwork to review yesterday.  9:30 am: PASARR obtained: [id number]. Expires 01/09/21.  Expected Discharge Plan: Home/Self Care Barriers to Discharge: Unsafe home situation,Family Issues  Expected Discharge Plan and Services Expected Discharge Plan: Home/Self Care                                               Social Determinants of Health (SDOH) Interventions    Readmission Risk Interventions No flowsheet data found.

## 2020-11-10 NOTE — TOC Transition Note (Addendum)
Transition of Care [facility]) - CM/SW Discharge Note   Patient Details  Name: Nicole Ramsey MRN: [id number] Date of Birth: 10-08-1926  Transition of Care [facility]) CM/SW Contact:  Margarito Liner, LCSW Phone Number: 11/10/2020, 11:54 AM   Clinical Narrative: Patient has orders to discharge to [facility] today. RN will call report to [phone]. EMS transport set up for 12:00. No further concerns. CSW signing off.    12:25 pm: Per RN, patient is declining going to rehab. CSW called patient in the room. Patient with dementia diagnosis. She said she was not aware of rehab recommendation and does not remember our conversation from yesterday and said her son Annette Stable has been telling her he wants her to go home. Explained that CSW, son, and daughter-in-law have been working on facility placement for her for the past week. Daughter-in-law or son will call her in the room.  12:36 pm: Daughter-in-law called back. Son called and spoke to patient to explain reason for going to rehab and said she was okay with it now. If we have any other issues, son asked that we call him directly so he can talk with her again. RN aware. No further concerns. CSW signing off.  Final next level of care: Skilled Nursing Facility Barriers to Discharge: Barriers Resolved   Patient Goals and CMS Choice Patient states their goals for this hospitalization and ongoing recovery are:: Family wants patient placed in an ALF   Choice offered to / list presented to : Patient,Adult Children  Discharge Placement PASRR number recieved: 11/10/20            Patient chooses bed at: [facility] [place] Patient to be transferred to facility by: EMS Name of family member notified: Petula Rotolo Patient and family notified of of transfer: 11/10/20  Discharge Plan and Services                                     Social Determinants of Health (SDOH) Interventions     Readmission Risk Interventions No flowsheet data  found.

## 2020-11-10 NOTE — Plan of Care (Signed)
Report given to receiving nurse at [facility].  Patient is in stable condition and is awaiting EMS transport.
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
## 2020-11-10 NOTE — Progress Notes (Signed)
Occupational Therapy Treatment Patient Details Name: Nicole Ramsey MRN: [id number] DOB: [birth year] Today's Date: 11/10/2020    History of present illness 87 y.o. female with a known history of essential hypertension and hypothyroidism, who presented to the emergency room with acute onset of failure to thrive with recent altered mental status with hallucination and paranoia increasing difficulty with anxiety. She was admitted under medical service for presumed syncopal work-up.   OT comments  Pt seen for OT treatment this date. OT facilitates pt participation in commode transfer with SUPV, standing peri care with SUPV, pt with good balance, using toilet rails to push to stand. Pt requires no AD for balance. Pt able to perform fxl mobility in her room with no AD with SUPV for occasional cues, but demos G static standing balance and G balance for fxl mobility. Pt able to perform LB dressing with SETUP in EOB sitting and washes hands with SETUP after completing other aspects of ADL routine. Pt left seated in chair with all needs in reach, awaiting d/c to facility. Tolerated session well.    Follow Up Recommendations   (follow MD's recommendation for d/c and f/u therapies)    Equipment Recommendations  3 in 1 bedside commode    Recommendations for Other Services      Precautions / Restrictions Precautions Precautions: Fall Restrictions Weight Bearing Restrictions: No Other Position/Activity Restrictions: Monitor orthostatics       Mobility Bed Mobility Overal bed mobility: Modified Independent Bed Mobility: Supine to Sit;Sit to Supine     Supine to sit: Modified independent (Device/Increase time) Sit to supine: Modified independent (Device/Increase time)      Transfers Overall transfer level: Needs assistance Equipment used: None Transfers: Sit to/from Stand Sit to Stand: Supervision              Balance Overall balance assessment: Needs assistance Sitting-balance  support: No upper extremity supported;Feet supported Sitting balance-Leahy Scale: Good     Standing balance support: No upper extremity supported;During functional activity Standing balance-Leahy Scale: Fair Standing balance comment: overall steady with static stand and fxl mobility, pt noted to be able to pivot/turn, but no further dynamic standing tested this date.                           ADL either performed or assessed with clinical judgement   ADL Overall ADL's : Needs assistance/impaired     Grooming: Wash/dry hands;Set up;Standing               Lower Body Dressing: Set up;Sitting/lateral leans Lower Body Dressing Details (indicate cue type and reason): to don socks, shoes in sitting EOB Toilet Transfer: Supervision/safety;BSC Toilet Transfer Details (indicate cue type and reason): standby, cues for safety, no physical assistance, no AD, SPS to/from EOB/chair Toileting- Clothing Manipulation and Hygiene: Set up;Sit to/from stand Toileting - Clothing Manipulation Details (indicate cue type and reason): using toilet rails to push to stand     Functional mobility during ADLs: Supervision/safety (no AD for fxl mobility to/from door in patient's room)       Vision Baseline Vision/History: Wears glasses Wears Glasses: At all times     Perception     Praxis      Cognition Arousal/Alertness: Awake/alert Behavior During Therapy: [facility] for tasks assessed/performed Overall Cognitive Status: Within Functional Limits for tasks assessed  Exercises Other Exercises Other Exercises: OT facilitates pt participation in commode transfer with SUPV, standing peri care with SUPV, pt with good balance, using toilet rails to push to stand. Pt requires no AD for balance. Pt able to perform fxl mobility in her room with no AD with SUPV for occasional cues, but demos G static standing balance and G balance for fxl  mobility. Pt able to perform LB dressing with SETUP in EOB sitting and washes hands with SETUP after completing other aspects of ADL routine.   Shoulder Instructions       General Comments      Pertinent Vitals/ Pain       Pain Assessment: No/denies pain  Home Living                                          Prior Functioning/Environment              Frequency  Min 1X/week        Progress Toward Goals  OT Goals(current goals can now be found in the care plan section)  Progress towards OT goals: Progressing toward goals  Acute Rehab OT Goals Patient Stated Goal: to improve strength and mobility OT Goal Formulation: With patient Time For Goal Achievement: 11/14/20 Potential to Achieve Goals: Good  Plan Discharge plan needs to be updated;Frequency remains appropriate    Co-evaluation                 AM-PAC OT "6 Clicks" Daily Activity     Outcome Measure   Help from another person eating meals?: None Help from another person taking care of personal grooming?: A Little Help from another person toileting, which includes using toliet, bedpan, or urinal?: A Little Help from another person bathing (including washing, rinsing, drying)?: A Little Help from another person to put on and taking off regular upper body clothing?: None Help from another person to put on and taking off regular lower body clothing?: A Little 6 Click Score: 20    End of Session Equipment Utilized During Treatment: Gait belt;Rolling walker  OT Visit Diagnosis: Unsteadiness on feet (R26.81);Muscle weakness (generalized) (M62.81)   Activity Tolerance Patient tolerated treatment well   Patient Left in chair;with chair alarm set;with call bell/phone within reach   Nurse Communication Mobility status        Time: 1040-1103 OT Time Calculation (min): 23 min  Charges: OT General Charges $OT Visit: 1 Visit OT Treatments $Self Care/Home Management : 8-22  mins $Therapeutic Activity: 8-22 mins  Rejeana Brock, MS, OTR/L ascom [phone] 11/10/20, 2:16 PM

## 2021-04-27 ENCOUNTER — Emergency Department: Payer: Medicare Other

## 2021-04-27 ENCOUNTER — Emergency Department
Admission: EM | Admit: 2021-04-27 | Discharge: 2021-04-27 | Disposition: A | Discharge status: Home / Self Care | Payer: Medicare Other | Attending: Emergency Medicine | Admitting: Emergency Medicine

## 2021-04-27 ENCOUNTER — Encounter: Payer: Self-pay | Admitting: Emergency Medicine

## 2021-04-27 DIAGNOSIS — F0281 Dementia in other diseases classified elsewhere with behavioral disturbance: Secondary | ICD-10-CM | POA: Insufficient documentation

## 2021-04-27 DIAGNOSIS — Z79899 Other long term (current) drug therapy: Secondary | ICD-10-CM | POA: Insufficient documentation

## 2021-04-27 DIAGNOSIS — E039 Hypothyroidism, unspecified: Secondary | ICD-10-CM | POA: Diagnosis not present

## 2021-04-27 DIAGNOSIS — G309 Alzheimer's disease, unspecified: Secondary | ICD-10-CM | POA: Insufficient documentation

## 2021-04-27 DIAGNOSIS — I10 Essential (primary) hypertension: Secondary | ICD-10-CM

## 2021-04-27 DIAGNOSIS — R109 Unspecified abdominal pain: Secondary | ICD-10-CM | POA: Diagnosis present

## 2021-04-27 DIAGNOSIS — R1031 Right lower quadrant pain: Secondary | ICD-10-CM | POA: Diagnosis not present

## 2021-04-27 LAB — CBC
HCT: 39.4 % (ref 36.0–46.0)
Hemoglobin: 12.7 g/dL (ref 12.0–15.0)
MCH: 28.3 pg (ref 26.0–34.0)
MCHC: 32.2 g/dL (ref 30.0–36.0)
MCV: 87.8 fL (ref 80.0–100.0)
Platelets: 170 10*3/uL (ref 150–400)
RBC: 4.49 MIL/uL (ref 3.87–5.11)
RDW: 15.5 % (ref 11.5–15.5)
WBC: 5.1 10*3/uL (ref 4.0–10.5)
nRBC: 0 % (ref 0.0–0.2)

## 2021-04-27 LAB — COMPREHENSIVE METABOLIC PANEL
ALT: 18 U/L (ref 0–44)
AST: 25 U/L (ref 15–41)
Albumin: 3.8 g/dL (ref 3.5–5.0)
Alkaline Phosphatase: 56 U/L (ref 38–126)
Anion gap: 4 — ABNORMAL LOW (ref 5–15)
BUN: 12 mg/dL (ref 8–23)
CO2: 27 mmol/L (ref 22–32)
Calcium: 9.1 mg/dL (ref 8.9–10.3)
Chloride: 103 mmol/L (ref 98–111)
Creatinine, Ser: 0.87 mg/dL (ref 0.44–1.00)
GFR, Estimated: >60 mL/min (ref >60)
Glucose, Bld: 154 mg/dL — ABNORMAL HIGH (ref 70–99)
Potassium: 3.6 mmol/L (ref 3.5–5.1)
Sodium: 134 mmol/L — ABNORMAL LOW (ref 135–145)
Total Bilirubin: 1.1 mg/dL (ref 0.3–1.2)
Total Protein: 7.5 g/dL (ref 6.5–8.1)

## 2021-04-27 LAB — LIPASE, BLOOD: Lipase: 38 U/L (ref 11–51)

## 2021-04-27 MED ORDER — IOHEXOL 350 MG/ML SOLN
75.0000 mL | Freq: Once | INTRAVENOUS | Status: AC | PRN
  Administered 2021-04-27: 75 mL via INTRAVENOUS

## 2021-04-27 MED ORDER — ATENOLOL 50 MG PO TABS: 50.0000 mg | ORAL_TABLET | Freq: Two times a day (BID) | ORAL | 0 refills | Status: DC

## 2021-04-27 NOTE — ED Provider Notes (Signed)
[facility] Emergency Department Provider Note   ____________________________________________   Event Date/Time   First MD Initiated Contact with Patient 04/27/21 1615     (approximate)  I have reviewed the triage vital signs and the nursing notes.   HISTORY  Chief Complaint Abdominal Pain    HPI Nicole Ramsey is a 87 y.o. female with below stated past medical history presents via EMS from a long-term care facility due to right-sided abdominal pain  LOCATION: Right-sided abdomen DURATION: 1 day prior to arrival TIMING: Intermittent and improved since onset SEVERITY: 3/10 QUALITY: Aching CONTEXT: Patient states she has had multiple medication changes recently as well as change in diet that she feels has led to abdominal pain MODIFYING FACTORS: Denies any exacerbating or relieving factors ASSOCIATED SYMPTOMS: Hypertension   Per medical record review, patient does have a past medical history of hypertension          Past Medical History:  Diagnosis Date   Hypertension    Thyroid disease     Patient Active Problem List   Diagnosis Date Noted   Failure to thrive in adult 11/06/2020   Protein-calorie malnutrition, severe 11/03/2020   Syncope 10/30/2020   Depression, major, recurrent, moderate (HCC) 10/27/2020   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 (HCC) 10/27/2020   Hypothyroid 10/27/2020   Hypertension 10/27/2020    History reviewed. No pertinent surgical history.  Prior to Admission medications   Medication Sig Start Date End Date Taking? Authorizing Provider  ALPRAZolam Prudy Feeler) 1 MG tablet Take 0.5 tablets (0.5 mg total) by mouth 2 (two) times daily as needed. 11/10/20   Marrion Coy, MD  amLODipine (NORVASC) 10 MG tablet Take 10 mg by mouth daily. 09/12/20   [provider]  atenolol (TENORMIN) 50 MG tablet Take 1 tablet (50 mg total) by mouth 2 (two) times daily. 04/27/21 05/27/21  Merwyn Katos, MD   feeding supplement (ENSURE ENLIVE / ENSURE PLUS) LIQD Take 237 mLs by mouth 2 (two) times daily between meals. 11/10/20   Marrion Coy, MD  levothyroxine (SYNTHROID) 50 MCG tablet Take 50 mcg by mouth daily. 10/11/20   [provider]  meloxicam (MOBIC) 15 MG tablet Take 15 mg by mouth daily. 09/14/20   [provider]  omeprazole (PRILOSEC) 40 MG capsule Take 40 mg by mouth daily. 09/14/20   [provider]  QUEtiapine (SEROQUEL) 25 MG tablet Take 25 mg by mouth at bedtime. 10/11/20   [provider]  Vitamin D, Ergocalciferol, (DRISDOL) 1.25 MG (50000 UNIT) CAPS capsule Take 50,000 Units by mouth once a week. 05/18/20   [provider]    Allergies Aspirin  No family history on file.  Social History Social History   Tobacco Use   Smoking status: Never   Smokeless tobacco: Never  Substance Use Topics   Alcohol use: Not Currently   Drug use: Never    Review of Systems Constitutional: No fever/chills Eyes: No visual changes. ENT: No sore throat. Cardiovascular: Denies chest pain. Respiratory: Denies shortness of breath. Gastrointestinal: No abdominal pain.  No nausea, no vomiting.  No diarrhea. Genitourinary: Negative for dysuria. Musculoskeletal: Negative for acute arthralgias Skin: Negative for rash. Neurological: Negative for headaches, weakness/numbness/paresthesias in any extremity Psychiatric: Negative for suicidal ideation/homicidal ideation   ____________________________________________   PHYSICAL EXAM:  VITAL SIGNS: ED Triage Vitals [04/27/21 1112]  Enc Vitals Group     BP (!) 148/67     Pulse Rate 69     Resp  18     Temp 98.4 F (36.9 C)     Temp Source Oral     SpO2 98 %     Weight 130 lb 15.3 oz (59.4 kg)     Height      Head Circumference      Peak Flow      Pain Score      Pain Loc      Pain Edu?      Excl. in GC?    Constitutional: Alert and oriented. Well appearing and in no acute distress. Eyes:  Conjunctivae are normal. PERRL. Head: Atraumatic. Nose: No congestion/rhinnorhea. Mouth/Throat: Mucous membranes are moist. Neck: No stridor Cardiovascular: Grossly normal heart sounds.  Good peripheral circulation. Respiratory: Normal respiratory effort.  No retractions. Gastrointestinal: Soft and nontender. No distention. Musculoskeletal: No obvious deformities Neurologic:  Normal speech and language. No gross focal neurologic deficits are appreciated. Skin:  Skin is warm and dry. No rash noted. Psychiatric: Mood and affect are normal. Speech and behavior are normal.  ____________________________________________   LABS (all labs ordered are listed, but only abnormal results are displayed)  Labs Reviewed  COMPREHENSIVE METABOLIC PANEL - Abnormal; Notable for the following components:      Result Value   Sodium 134 (*)    Glucose, Bld 154 (*)    Anion gap 4 (*)    All other components within normal limits  LIPASE, BLOOD  CBC  URINALYSIS, COMPLETE (UACMP) WITH MICROSCOPIC   ____________________________________________  EKG  ED ECG REPORT I, Merwyn Katos, the attending physician, personally viewed and interpreted this ECG.  Date: 04/27/2021 EKG Time: 1116 Rate: 67 Rhythm: Bradycardic sinus rhythm QRS Axis: normal Intervals: Right bundle branch block ST/T Wave abnormalities: normal Narrative Interpretation: Bradycardic sinus rhythm with right bundle branch block.  No evidence of acute ischemia  ____________________________________________  RADIOLOGY  ED MD interpretation: CT of the abdomen and pelvis with IV contrast shows no evidence of acute abnormalities  Official radiology report(s): CT Abdomen Pelvis W Contrast  Result Date: 04/27/2021 CLINICAL DATA:  Right side abdominal pain. EXAM: CT ABDOMEN AND PELVIS WITH CONTRAST TECHNIQUE: Multidetector CT imaging of the abdomen and pelvis was performed using the standard protocol following bolus administration of  intravenous contrast. CONTRAST:  33mL OMNIPAQUE IOHEXOL 350 MG/ML SOLN COMPARISON:  03/09/2008 FINDINGS: Lower chest: Aortic atherosclerosis. Coronary artery calcifications. No acute abnormality. Hepatobiliary: No focal hepatic abnormality. Gallbladder unremarkable. Pancreas: No focal abnormality or ductal dilatation. Spleen: No focal abnormality.  Normal size. Adrenals/Urinary Tract: No adrenal abnormality. No focal renal abnormality. No stones or hydronephrosis. Urinary bladder is unremarkable. Stomach/Bowel: Stomach, large and small bowel grossly unremarkable. Vascular/Lymphatic: Aortic atherosclerosis. No evidence of aneurysm or adenopathy. Reproductive: Prior hysterectomy.  No adnexal masses. Other: No free fluid or free air. Musculoskeletal: No acute bony abnormality. IMPRESSION: No acute findings in the abdomen or pelvis. Aortic atherosclerosis. Electronically Signed   By: Charlett Nose M.D.   On: 04/27/2021 17:34    ____________________________________________   PROCEDURES  Procedure(s) performed (including Critical Care):  .1-3 Lead EKG Interpretation  Date/Time: 04/27/2021 11:26 PM Performed by: Merwyn Katos, MD Authorized by: Merwyn Katos, MD     Interpretation: normal     ECG rate:  68   ECG rate assessment: normal     Rhythm: sinus rhythm     Ectopy: none     Conduction: normal   Comments:     Right bundle branch block   ____________________________________________   INITIAL IMPRESSION /  ASSESSMENT AND PLAN / ED COURSE  As part of my medical decision making, I reviewed the following data within the electronic medical record, if available:  Nursing notes reviewed and incorporated, Labs reviewed, EKG interpreted, Old chart reviewed, Radiograph reviewed and Notes from prior ED visits reviewed and incorporated        Patients symptoms not typical for emergent causes of abdominal pain such as, but not limited to, appendicitis, abdominal aortic aneurysm, surgical  biliary disease, pancreatitis, SBO, mesenteric ischemia, serious intra-abdominal bacterial illness. Presentation also not typical of gynecologic emergencies such as TOA, Ovarian Torsion, PID. Not Ectopic. Doubt atypical ACS.  Pt tolerating PO. Disposition: Patient will be discharged with strict return precautions and follow up with primary MD within 12-24 hours for further evaluation. Patient understands that this still may have an early presentation of an emergent medical condition such as appendicitis that will require a recheck.      ____________________________________________   FINAL CLINICAL IMPRESSION(S) / ED DIAGNOSES  Final diagnoses:  Right lower quadrant abdominal pain  Primary hypertension     ED Discharge Orders          Ordered    atenolol (TENORMIN) 50 MG tablet  2 times daily        04/27/21 1848             Note:  This document was prepared using Dragon voice recognition software and may include unintentional dictation errors.    Merwyn Katos, MD 04/27/21 [phone]

## 2021-04-27 NOTE — ED Notes (Signed)
Notified by Rosey Bath at [facility] that there is no one available to pick up the patient.  EMS called to transport patient back to facility.

## 2021-04-27 NOTE — ED Triage Notes (Signed)
Pt in w/dull R sided abdominal pain x 2 days. Denies any n/v/d, feels weaker than her normal self. No cp or sob. States she hasn't had BM since yesterday

## 2021-04-27 NOTE — ED Notes (Signed)
Rosey Bath at [facility] notified of pt departure at this time.

## 2021-04-27 NOTE — ED Notes (Signed)
Spoke with family and updated. 

## 2021-04-27 NOTE — ED Notes (Signed)
Spoke with Rosey Bath at [facility] [phone]) she needs to speak with her supervisor and will return call to let this RN know about transportation for patient to return to facility.

## 2021-04-27 NOTE — ED Notes (Signed)
Pt to toilet and back to bed.

## 2021-06-25 ENCOUNTER — Other Ambulatory Visit: Payer: Self-pay

## 2021-06-25 ENCOUNTER — Encounter: Payer: Self-pay | Admitting: *Deleted

## 2021-06-25 DIAGNOSIS — E871 Hypo-osmolality and hyponatremia: Secondary | ICD-10-CM | POA: Insufficient documentation

## 2021-06-25 DIAGNOSIS — K5904 Chronic idiopathic constipation: Secondary | ICD-10-CM | POA: Diagnosis not present

## 2021-06-25 DIAGNOSIS — F0390 Unspecified dementia without behavioral disturbance: Secondary | ICD-10-CM | POA: Insufficient documentation

## 2021-06-25 DIAGNOSIS — I10 Essential (primary) hypertension: Secondary | ICD-10-CM | POA: Insufficient documentation

## 2021-06-25 DIAGNOSIS — E039 Hypothyroidism, unspecified: Secondary | ICD-10-CM | POA: Diagnosis not present

## 2021-06-25 DIAGNOSIS — Z79899 Other long term (current) drug therapy: Secondary | ICD-10-CM | POA: Insufficient documentation

## 2021-06-25 DIAGNOSIS — R109 Unspecified abdominal pain: Secondary | ICD-10-CM | POA: Diagnosis present

## 2021-06-25 LAB — COMPREHENSIVE METABOLIC PANEL
ALT: 13 U/L (ref 0–44)
AST: 16 U/L (ref 15–41)
Albumin: 4.4 g/dL (ref 3.5–5.0)
Alkaline Phosphatase: 61 U/L (ref 38–126)
Anion gap: 8 (ref 5–15)
BUN: 11 mg/dL (ref 8–23)
CO2: 29 mmol/L (ref 22–32)
Calcium: 9.9 mg/dL (ref 8.9–10.3)
Chloride: 90 mmol/L — ABNORMAL LOW (ref 98–111)
Creatinine, Ser: 1 mg/dL (ref 0.44–1.00)
GFR, Estimated: 53 mL/min — ABNORMAL LOW (ref >60)
Glucose, Bld: 127 mg/dL — ABNORMAL HIGH (ref 70–99)
Potassium: 3.9 mmol/L (ref 3.5–5.1)
Sodium: 127 mmol/L — ABNORMAL LOW (ref 135–145)
Total Bilirubin: 1.5 mg/dL — ABNORMAL HIGH (ref 0.3–1.2)
Total Protein: 8.3 g/dL — ABNORMAL HIGH (ref 6.5–8.1)

## 2021-06-25 LAB — CBC
HCT: 37.3 % (ref 36.0–46.0)
Hemoglobin: 13 g/dL (ref 12.0–15.0)
MCH: 30.1 pg (ref 26.0–34.0)
MCHC: 34.9 g/dL (ref 30.0–36.0)
MCV: 86.3 fL (ref 80.0–100.0)
Platelets: 213 10*3/uL (ref 150–400)
RBC: 4.32 MIL/uL (ref 3.87–5.11)
RDW: 14.9 % (ref 11.5–15.5)
WBC: 6.2 10*3/uL (ref 4.0–10.5)
nRBC: 0 % (ref 0.0–0.2)

## 2021-06-25 LAB — TROPONIN I (HIGH SENSITIVITY): Troponin I (High Sensitivity): 7 ng/L (ref <18)

## 2021-06-25 LAB — LIPASE, BLOOD: Lipase: 58 U/L — ABNORMAL HIGH (ref 11–51)

## 2021-06-25 NOTE — ED Triage Notes (Signed)
Pt to triage via wheelchair.  Pt has abd pain for 3 days.  No v/d.  Pt taking tums without relief.  Denies back pain or urinary sx.  Pt alert.  Friend with pt.

## 2021-06-26 ENCOUNTER — Emergency Department
Admission: EM | Admit: 2021-06-26 | Discharge: 2021-06-26 | Disposition: A | Discharge status: Home / Self Care | Payer: Medicare Other | Attending: Emergency Medicine | Admitting: Emergency Medicine

## 2021-06-26 ENCOUNTER — Encounter: Payer: Self-pay | Admitting: Radiology

## 2021-06-26 ENCOUNTER — Emergency Department: Payer: Medicare Other

## 2021-06-26 DIAGNOSIS — E871 Hypo-osmolality and hyponatremia: Secondary | ICD-10-CM

## 2021-06-26 DIAGNOSIS — R1032 Left lower quadrant pain: Secondary | ICD-10-CM

## 2021-06-26 DIAGNOSIS — K5904 Chronic idiopathic constipation: Secondary | ICD-10-CM

## 2021-06-26 LAB — TROPONIN I (HIGH SENSITIVITY): Troponin I (High Sensitivity): 7 ng/L (ref <18)

## 2021-06-26 LAB — BASIC METABOLIC PANEL
Anion gap: 9 (ref 5–15)
BUN: 9 mg/dL (ref 8–23)
CO2: 22 mmol/L (ref 22–32)
Calcium: 7.3 mg/dL — ABNORMAL LOW (ref 8.9–10.3)
Chloride: 102 mmol/L (ref 98–111)
Creatinine, Ser: 0.56 mg/dL (ref 0.44–1.00)
GFR, Estimated: >60 mL/min (ref >60)
Glucose, Bld: 104 mg/dL — ABNORMAL HIGH (ref 70–99)
Potassium: 2.9 mmol/L — ABNORMAL LOW (ref 3.5–5.1)
Sodium: 133 mmol/L — ABNORMAL LOW (ref 135–145)

## 2021-06-26 LAB — URINALYSIS, COMPLETE (UACMP) WITH MICROSCOPIC
Bilirubin Urine: NEGATIVE
Glucose, UA: NEGATIVE mg/dL
Hgb urine dipstick: NEGATIVE
Ketones, ur: 5 mg/dL — AB
Leukocytes,Ua: NEGATIVE
Nitrite: NEGATIVE
Protein, ur: 100 mg/dL — AB
Specific Gravity, Urine: 1.009 (ref 1.005–1.030)
pH: 6 (ref 5.0–8.0)

## 2021-06-26 MED ORDER — PROBIOTIC 1-250 BILLION-MG PO CAPS: 1.0000 | ORAL_CAPSULE | Freq: Every day | ORAL | 0 refills | Status: DC

## 2021-06-26 MED ORDER — SODIUM CHLORIDE 0.9 % IV BOLUS
1000.0000 mL | Freq: Once | INTRAVENOUS | Status: AC
  Administered 2021-06-26: 1000 mL via INTRAVENOUS

## 2021-06-26 MED ORDER — DULCOLAX 5 MG PO TBEC: 5.0000 mg | DELAYED_RELEASE_TABLET | Freq: Every day | ORAL | 1 refills | Status: AC | PRN

## 2021-06-26 MED ORDER — DILTIAZEM HCL-DEXTROSE 125-5 MG/125ML-% IV SOLN (PREMIX)
5.0000 mg/h | INTRAVENOUS | Status: DC
Start: 2021-06-26 — End: 2021-06-26

## 2021-06-26 MED ORDER — DOCUSATE SODIUM 100 MG PO CAPS: 100.0000 mg | ORAL_CAPSULE | Freq: Every day | ORAL | 2 refills | Status: AC

## 2021-06-26 MED ORDER — ACETAMINOPHEN 500 MG PO TABS
1000.0000 mg | ORAL_TABLET | Freq: Once | ORAL | Status: AC
  Administered 2021-06-26: 1000 mg via ORAL
  Filled 2021-06-26: qty 2

## 2021-06-26 MED ORDER — IOHEXOL 350 MG/ML SOLN
75.0000 mL | Freq: Once | INTRAVENOUS | Status: AC | PRN
  Administered 2021-06-26: 75 mL via INTRAVENOUS
  Filled 2021-06-26: qty 75

## 2021-06-26 MED ORDER — AMLODIPINE BESYLATE 5 MG PO TABS
10.0000 mg | ORAL_TABLET | Freq: Once | ORAL | Status: AC
  Administered 2021-06-26: 10 mg via ORAL
  Filled 2021-06-26: qty 2

## 2021-06-26 MED ORDER — POTASSIUM CHLORIDE CRYS ER 20 MEQ PO TBCR
40.0000 meq | EXTENDED_RELEASE_TABLET | Freq: Once | ORAL | Status: AC
  Administered 2021-06-26: 40 meq via ORAL
  Filled 2021-06-26: qty 2

## 2021-06-26 NOTE — Discharge Instructions (Signed)
Take colace every night. Take Dulcolax once a day as needed for constipation.  With your doctor in 2 days.  Return to the emergency room for new or worsening abdominal pain, abdominal distention, nausea or vomiting.

## 2021-06-26 NOTE — ED Provider Notes (Signed)
[facility] Emergency Department Provider Note  ____________________________________________  Time seen: Approximately 5:29 AM  I have reviewed the triage vital signs and the nursing notes.   HISTORY  Chief Complaint Abdominal Pain   HPI Nehemie E Nangle is a 87 y.o. female with a history of hypertension, hypothyroidism, Alzheimer's, constipation who presents from home for evaluation of abdominal pain.  Patient reports 3 days of intermittent sharp left lower quadrant abdominal pain.  No pain at this time.  She reports a history of chronic constipation.  Last bowel movement was yesterday.  She has had some nausea but no vomiting, no diarrhea, no dysuria, no hematuria, no flank pain, no chest pain, no shortness of breath.  I spoke with her son over the phone.  Patient lives alone but has some assistance from a home health nurse.  Patient has run out of of medications for constipation and he thinks her pain is coming from being constipated.   Past Medical History:  Diagnosis Date   Hypertension    Thyroid disease     Patient Active Problem List   Diagnosis Date Noted   Failure to thrive in adult 11/06/2020   Protein-calorie malnutrition, severe 11/03/2020   Syncope 10/30/2020   Depression, major, recurrent, moderate (HCC) 10/27/2020   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 (HCC) 10/27/2020   Hypothyroid 10/27/2020   Hypertension 10/27/2020    No past surgical history on file.  Prior to Admission medications   Medication Sig Start Date End Date Taking? Authorizing Provider  bisacodyl (DULCOLAX) 5 MG EC tablet Take 1 tablet (5 mg total) by mouth daily as needed for moderate constipation. 06/26/21 06/26/22 Yes Jaicee Michelotti, [place], MD  docusate sodium (COLACE) 100 MG capsule Take 1 capsule (100 mg total) by mouth at bedtime. 06/26/21 06/26/22 Yes Solymar Grace, [place], MD  ALPRAZolam Prudy Feeler) 1 MG tablet Take 0.5 tablets (0.5 mg total) by mouth  2 (two) times daily as needed. 11/10/20   Marrion Coy, MD  amLODipine (NORVASC) 10 MG tablet Take 10 mg by mouth daily. 09/12/20   [provider]  atenolol (TENORMIN) 50 MG tablet Take 1 tablet (50 mg total) by mouth 2 (two) times daily. 04/27/21 05/27/21  Merwyn Katos, MD  feeding supplement (ENSURE ENLIVE / ENSURE PLUS) LIQD Take 237 mLs by mouth 2 (two) times daily between meals. 11/10/20   Marrion Coy, MD  levothyroxine (SYNTHROID) 50 MCG tablet Take 50 mcg by mouth daily. 10/11/20   [provider]  meloxicam (MOBIC) 15 MG tablet Take 15 mg by mouth daily. 09/14/20   [provider]  omeprazole (PRILOSEC) 40 MG capsule Take 40 mg by mouth daily. 09/14/20   [provider]  QUEtiapine (SEROQUEL) 25 MG tablet Take 25 mg by mouth at bedtime. 10/11/20   [provider]  Vitamin D, Ergocalciferol, (DRISDOL) 1.25 MG (50000 UNIT) CAPS capsule Take 50,000 Units by mouth once a week. 05/18/20   [provider]    Allergies Aspirin  No family history on file.  Social History Social History   Tobacco Use   Smoking status: Never   Smokeless tobacco: Never  Substance Use Topics   Alcohol use: Not Currently   Drug use: Never    Review of Systems  Constitutional: Negative for fever. Eyes: Negative for visual changes. ENT: Negative for sore throat. Neck: No neck pain  Cardiovascular: Negative for chest pain. Respiratory: Negative for shortness of breath. Gastrointestinal: + abdominal pain, nausea, constipation. No vomiting or diarrhea.  Genitourinary: Negative for dysuria. Musculoskeletal: Negative for back pain. Skin: Negative for rash. Neurological: Negative for headaches, weakness or numbness. Psych: No SI or HI  ____________________________________________   PHYSICAL EXAM:  VITAL SIGNS: ED Triage Vitals  Enc Vitals Group     BP 06/25/21 2052 (!) 183/73     Pulse Rate 06/25/21 2052 64     Resp 06/25/21 2052 18     Temp  06/25/21 2052 98.6 F (37 C)     Temp Source 06/25/21 2052 Oral     SpO2 06/25/21 2052 97 %     Weight 06/25/21 2048 149 lb (67.6 kg)     Height 06/25/21 2048 5\' 7"  (1.702 m)     Head Circumference --      Peak Flow --      Pain Score 06/25/21 2047 10     Pain Loc --      Pain Edu? --      Excl. in GC? --     Constitutional: Alert and oriented. Well appearing and in no apparent distress. HEENT:      Head: Normocephalic and atraumatic.         Eyes: Conjunctivae are normal. Sclera is non-icteric.       Mouth/Throat: Mucous membranes are moist.       Neck: Supple with no signs of meningismus. Cardiovascular: Regular rate and rhythm. No murmurs, gallops, or rubs. 2+ symmetrical distal pulses are present in all extremities. No JVD. Respiratory: Normal respiratory effort. Lungs are clear to auscultation bilaterally.  Gastrointestinal: Soft, non tender, and non distended with positive bowel sounds. No rebound or guarding. Genitourinary: No CVA tenderness. Musculoskeletal:  No edema, cyanosis, or erythema of extremities. Neurologic: Normal speech and language. Face is symmetric. Moving all extremities. No gross focal neurologic deficits are appreciated. Skin: Skin is warm, dry and intact. No rash noted. Psychiatric: Mood and affect are normal. Speech and behavior are normal.  ____________________________________________   LABS (all labs ordered are listed, but only abnormal results are displayed)  Labs Reviewed  LIPASE, BLOOD - Abnormal; Notable for the following components:      Result Value   Lipase 58 (*)    All other components within normal limits  COMPREHENSIVE METABOLIC PANEL - Abnormal; Notable for the following components:   Sodium 127 (*)    Chloride 90 (*)    Glucose, Bld 127 (*)    Total Protein 8.3 (*)    Total Bilirubin 1.5 (*)    GFR, Estimated 53 (*)    All other components within normal limits  URINALYSIS, COMPLETE (UACMP) WITH MICROSCOPIC - Abnormal; Notable  for the following components:   Color, Urine YELLOW (*)    APPearance CLEAR (*)    Ketones, ur 5 (*)    Protein, ur 100 (*)    Bacteria, UA RARE (*)    All other components within normal limits  BASIC METABOLIC PANEL - Abnormal; Notable for the following components:   Sodium 133 (*)    Potassium 2.9 (*)    Glucose, Bld 104 (*)    Calcium 7.3 (*)    All other components within normal limits  CBC  TROPONIN I (HIGH SENSITIVITY)  TROPONIN I (HIGH SENSITIVITY)   ____________________________________________  EKG  ED ECG REPORT I, 2048, the attending physician, personally viewed and interpreted this ECG.  Normal sinus rhythm with a rate of 65, first-degree AV block, right bundle branch block, no ST elevations or depressions.  Unchanged from prior. ____________________________________________  RADIOLOGY  I have personally reviewed the images performed during this visit and I agree with the Radiologist's read.   Interpretation by Radiologist:  CT ABDOMEN PELVIS W CONTRAST  Result Date: 06/26/2021 CLINICAL DATA:  Abdominal pain for 3 days. EXAM: CT ABDOMEN AND PELVIS WITH CONTRAST TECHNIQUE: Multidetector CT imaging of the abdomen and pelvis was performed using the standard protocol following bolus administration of intravenous contrast. CONTRAST:  1mL OMNIPAQUE IOHEXOL 350 MG/ML SOLN COMPARISON:  04/27/2021 FINDINGS: Lower chest: Mild atelectasis and scarring in the lung bases. No pleural effusion. Coronary atherosclerosis. Normal heart size. Hepatobiliary: Diffusely decreased attenuation of the liver compatible with steatosis. Unremarkable gallbladder. No biliary dilatation. Pancreas: Unremarkable. Spleen: Unremarkable. Adrenals/Urinary Tract: Unremarkable adrenal glands. Subcentimeter hypodensities in the kidneys, too small to fully characterize. No renal calculi or hydronephrosis. Unremarkable bladder. Stomach/Bowel: The stomach is unremarkable. There is no evidence of  bowel obstruction or inflammation. The appendix is not clearly identified, however no inflammatory changes are seen in the right lower quadrant. Vascular/Lymphatic: Abdominal aortic atherosclerosis without aneurysm. No enlarged lymph nodes. Reproductive: Status post hysterectomy. No adnexal masses. Other: No intraperitoneal free fluid. Musculoskeletal: No acute osseous abnormality or suspicious osseous lesion. IMPRESSION: 1. No acute abnormality identified in the abdomen or pelvis. 2. Hepatic steatosis. 3. Aortic Atherosclerosis (ICD10-I70.0). Electronically Signed   By: Sebastian Ache M.D.   On: 06/26/2021 05:27     ____________________________________________   PROCEDURES  Procedure(s) performed: None Procedures   Critical Care performed:  None ____________________________________________   INITIAL IMPRESSION / ASSESSMENT AND PLAN / ED COURSE  87 y.o. female with a history of hypertension, hypothyroidism, Alzheimer's, constipation who presents from home for evaluation of abdominal pain.  Patient with 3 days of intermittent sharp left lower quadrant abdominal pain.  No pain at this time.  Family concerned that her constipation is worse because she ran out of her medications for the last week.  Unclear what she was taking for constipation.  Her abdomen is soft and nontender at this time.  UA with no signs of UTI.  Blood work showing borderline lipase with normal LFTs, mild hyponatremia stable creatinine with no signs of AKI.  No signs of sepsis.  CT abdomen pelvis showing no acute pathology in the abdomen or pelvis.  Patient received a liter of normal saline.  We will repeat a BMP and if that improving plan to discharge home as patient is asymptomatic with no signs of confusion from her low sodium.  We will provide a prescription for Dulcolax and Colace.  Recommend increase fiber in her diet and oral hydration.  Discussed this recommendations with patient, her friend was at bedside, and her son over  the phone.  Discussed my standard return precautions for new or worsening abdominal pain, distention, vomiting or fever.  _________________________ 7:03 AM on 06/26/2021 ----------------------------------------- Repeat Na 133. Will dc home.      _____________________________________________ Please note:  Patient was evaluated in Emergency Department today for the symptoms described in the history of present illness. Patient was evaluated in the context of the global COVID-19 pandemic, which necessitated consideration that the patient might be at risk for infection with the SARS-CoV-2 virus that causes COVID-19. Institutional protocols and algorithms that pertain to the evaluation of patients at risk for COVID-19 are in a state of rapid change based on information released by regulatory bodies including the CDC and federal and state organizations. These policies and algorithms were followed during the patient's care in the ED.  Some ED evaluations  and interventions may be delayed as a result of limited staffing during the pandemic.   [place] Controlled Substance Database was reviewed by me. ____________________________________________   FINAL CLINICAL IMPRESSION(S) / ED DIAGNOSES   Final diagnoses:  Left lower quadrant abdominal pain  Chronic idiopathic constipation  Hyponatremia      NEW MEDICATIONS STARTED DURING THIS VISIT:  ED Discharge Orders          Ordered    bisacodyl (DULCOLAX) 5 MG EC tablet  Daily PRN        06/26/21 0631    docusate sodium (COLACE) 100 MG capsule  Daily at bedtime        06/26/21 0631             Note:  This document was prepared using Dragon voice recognition software and may include unintentional dictation errors.    Don Perking, [place], MD 06/26/21 [phone]

## 2021-07-06 ENCOUNTER — Other Ambulatory Visit: Payer: Self-pay

## 2021-07-06 ENCOUNTER — Emergency Department: Payer: Medicare Other

## 2021-07-06 ENCOUNTER — Inpatient Hospital Stay
Admission: EM | Admit: 2021-07-06 | Discharge: 2021-07-10 | DRG: 304 | Disposition: A | Discharge status: Skilled Nursing Facility | Payer: Medicare Other | Attending: Internal Medicine | Admitting: Internal Medicine

## 2021-07-06 DIAGNOSIS — E1169 Type 2 diabetes mellitus with other specified complication: Secondary | ICD-10-CM

## 2021-07-06 DIAGNOSIS — I10 Essential (primary) hypertension: Secondary | ICD-10-CM | POA: Diagnosis present

## 2021-07-06 DIAGNOSIS — K5900 Constipation, unspecified: Secondary | ICD-10-CM | POA: Diagnosis present

## 2021-07-06 DIAGNOSIS — F0150 Vascular dementia without behavioral disturbance: Secondary | ICD-10-CM

## 2021-07-06 DIAGNOSIS — I160 Hypertensive urgency: Principal | ICD-10-CM

## 2021-07-06 DIAGNOSIS — Z20822 Contact with and (suspected) exposure to covid-19: Secondary | ICD-10-CM | POA: Diagnosis present

## 2021-07-06 DIAGNOSIS — G928 Other toxic encephalopathy: Secondary | ICD-10-CM | POA: Diagnosis present

## 2021-07-06 DIAGNOSIS — K219 Gastro-esophageal reflux disease without esophagitis: Secondary | ICD-10-CM | POA: Diagnosis present

## 2021-07-06 DIAGNOSIS — E871 Hypo-osmolality and hyponatremia: Secondary | ICD-10-CM | POA: Diagnosis not present

## 2021-07-06 DIAGNOSIS — F32A Depression, unspecified: Secondary | ICD-10-CM | POA: Diagnosis not present

## 2021-07-06 DIAGNOSIS — I959 Hypotension, unspecified: Secondary | ICD-10-CM | POA: Diagnosis present

## 2021-07-06 DIAGNOSIS — E538 Deficiency of other specified B group vitamins: Secondary | ICD-10-CM | POA: Diagnosis present

## 2021-07-06 DIAGNOSIS — F419 Anxiety disorder, unspecified: Secondary | ICD-10-CM

## 2021-07-06 DIAGNOSIS — Z79899 Other long term (current) drug therapy: Secondary | ICD-10-CM

## 2021-07-06 DIAGNOSIS — E039 Hypothyroidism, unspecified: Secondary | ICD-10-CM | POA: Diagnosis present

## 2021-07-06 DIAGNOSIS — R4182 Altered mental status, unspecified: Principal | ICD-10-CM | POA: Diagnosis present

## 2021-07-06 DIAGNOSIS — Z886 Allergy status to analgesic agent status: Secondary | ICD-10-CM

## 2021-07-06 DIAGNOSIS — F0154 Vascular dementia, unspecified severity, with anxiety: Secondary | ICD-10-CM | POA: Diagnosis present

## 2021-07-06 DIAGNOSIS — E119 Type 2 diabetes mellitus without complications: Secondary | ICD-10-CM

## 2021-07-06 DIAGNOSIS — E0781 Sick-euthyroid syndrome: Secondary | ICD-10-CM | POA: Diagnosis present

## 2021-07-06 DIAGNOSIS — Z79890 Hormone replacement therapy (postmenopausal): Secondary | ICD-10-CM

## 2021-07-06 DIAGNOSIS — R410 Disorientation, unspecified: Secondary | ICD-10-CM | POA: Diagnosis not present

## 2021-07-06 DIAGNOSIS — N179 Acute kidney failure, unspecified: Secondary | ICD-10-CM

## 2021-07-06 DIAGNOSIS — R1110 Vomiting, unspecified: Secondary | ICD-10-CM

## 2021-07-06 LAB — URINALYSIS, COMPLETE (UACMP) WITH MICROSCOPIC
Bacteria, UA: NONE SEEN
Bilirubin Urine: NEGATIVE
Glucose, UA: NEGATIVE mg/dL
Hgb urine dipstick: NEGATIVE
Ketones, ur: NEGATIVE mg/dL
Leukocytes,Ua: NEGATIVE
Nitrite: NEGATIVE
Protein, ur: 30 mg/dL — AB
Specific Gravity, Urine: 1.006 (ref 1.005–1.030)
pH: 8 (ref 5.0–8.0)

## 2021-07-06 LAB — RESP PANEL BY RT-PCR (FLU A&B, COVID) ARPGX2
Influenza A by PCR: NEGATIVE
Influenza B by PCR: NEGATIVE
SARS Coronavirus 2 by RT PCR: NEGATIVE

## 2021-07-06 LAB — APTT: aPTT: 26 seconds (ref 24–36)

## 2021-07-06 LAB — DIFFERENTIAL
Abs Immature Granulocytes: 0.03 10*3/uL (ref 0.00–0.07)
Basophils Absolute: 0 10*3/uL (ref 0.0–0.1)
Basophils Relative: 1 %
Eosinophils Absolute: 0 10*3/uL (ref 0.0–0.5)
Eosinophils Relative: 1 %
Immature Granulocytes: 1 %
Lymphocytes Relative: 29 %
Lymphs Abs: 1.5 10*3/uL (ref 0.7–4.0)
Monocytes Absolute: 0.6 10*3/uL (ref 0.1–1.0)
Monocytes Relative: 10 %
Neutro Abs: 3.2 10*3/uL (ref 1.7–7.7)
Neutrophils Relative %: 58 %

## 2021-07-06 LAB — CBC
HCT: 41.7 % (ref 36.0–46.0)
Hemoglobin: 14 g/dL (ref 12.0–15.0)
MCH: 29.2 pg (ref 26.0–34.0)
MCHC: 33.6 g/dL (ref 30.0–36.0)
MCV: 87.1 fL (ref 80.0–100.0)
Platelets: 237 10*3/uL (ref 150–400)
RBC: 4.79 MIL/uL (ref 3.87–5.11)
RDW: 14.9 % (ref 11.5–15.5)
WBC: 5.4 10*3/uL (ref 4.0–10.5)
nRBC: 0 % (ref 0.0–0.2)

## 2021-07-06 LAB — COMPREHENSIVE METABOLIC PANEL
ALT: 12 U/L (ref 0–44)
AST: 16 U/L (ref 15–41)
Albumin: 4.1 g/dL (ref 3.5–5.0)
Alkaline Phosphatase: 54 U/L (ref 38–126)
Anion gap: 9 (ref 5–15)
BUN: 7 mg/dL — ABNORMAL LOW (ref 8–23)
CO2: 28 mmol/L (ref 22–32)
Calcium: 9.4 mg/dL (ref 8.9–10.3)
Chloride: 88 mmol/L — ABNORMAL LOW (ref 98–111)
Creatinine, Ser: 0.92 mg/dL (ref 0.44–1.00)
GFR, Estimated: 58 mL/min — ABNORMAL LOW (ref >60)
Glucose, Bld: 128 mg/dL — ABNORMAL HIGH (ref 70–99)
Potassium: 3.7 mmol/L (ref 3.5–5.1)
Sodium: 125 mmol/L — ABNORMAL LOW (ref 135–145)
Total Bilirubin: 0.9 mg/dL (ref 0.3–1.2)
Total Protein: 8 g/dL (ref 6.5–8.1)

## 2021-07-06 LAB — PROTIME-INR
INR: 1 (ref 0.8–1.2)
Prothrombin Time: 13 seconds (ref 11.4–15.2)

## 2021-07-06 MED ORDER — ENSURE ENLIVE PO LIQD
237.0000 mL | Freq: Two times a day (BID) | ORAL | Status: DC
  Administered 2021-07-07 – 2021-07-10 (×7): 237 mL via ORAL

## 2021-07-06 MED ORDER — SODIUM CHLORIDE 0.9 % IV SOLN: Freq: Once | INTRAVENOUS | Status: AC

## 2021-07-06 MED ORDER — HYDRALAZINE HCL 25 MG PO TABS
25.0000 mg | ORAL_TABLET | Freq: Two times a day (BID) | ORAL | Status: DC
  Administered 2021-07-07 – 2021-07-08 (×4): 25 mg via ORAL
  Filled 2021-07-06 (×4): qty 1

## 2021-07-06 MED ORDER — NICARDIPINE HCL IN NACL 20-0.86 MG/200ML-% IV SOLN
3.0000 mg/h | INTRAVENOUS | Status: DC
Start: 2021-07-06 — End: 2021-07-06
  Administered 2021-07-06: 5 mg/h via INTRAVENOUS
  Filled 2021-07-06: qty 200

## 2021-07-06 MED ORDER — AMLODIPINE BESYLATE 10 MG PO TABS
10.0000 mg | ORAL_TABLET | Freq: Every day | ORAL | Status: DC
  Administered 2021-07-07 – 2021-07-10 (×4): 10 mg via ORAL
  Filled 2021-07-06 (×3): qty 1
  Filled 2021-07-06: qty 2

## 2021-07-06 MED ORDER — PANTOPRAZOLE SODIUM 40 MG PO TBEC
40.0000 mg | DELAYED_RELEASE_TABLET | Freq: Every day | ORAL | Status: DC
  Administered 2021-07-07 – 2021-07-08 (×2): 40 mg via ORAL
  Filled 2021-07-06 (×3): qty 1

## 2021-07-06 MED ORDER — SODIUM CHLORIDE 0.9 % IV SOLN: INTRAVENOUS | Status: AC

## 2021-07-06 MED ORDER — ENOXAPARIN SODIUM 40 MG/0.4ML IJ SOSY
40.0000 mg | PREFILLED_SYRINGE | INTRAMUSCULAR | Status: DC
  Administered 2021-07-07 – 2021-07-10 (×4): 40 mg via SUBCUTANEOUS
  Filled 2021-07-06 (×4): qty 0.4

## 2021-07-06 MED ORDER — CITALOPRAM HYDROBROMIDE 20 MG PO TABS
10.0000 mg | ORAL_TABLET | Freq: Every day | ORAL | Status: DC
  Administered 2021-07-07 – 2021-07-10 (×4): 10 mg via ORAL
  Filled 2021-07-06 (×4): qty 1

## 2021-07-06 MED ORDER — QUETIAPINE FUMARATE 25 MG PO TABS
25.0000 mg | ORAL_TABLET | Freq: Every day | ORAL | Status: DC
  Administered 2021-07-07 – 2021-07-09 (×4): 25 mg via ORAL
  Filled 2021-07-06 (×4): qty 1

## 2021-07-06 MED ORDER — LEVOTHYROXINE SODIUM 50 MCG PO TABS
50.0000 ug | ORAL_TABLET | Freq: Every day | ORAL | Status: DC
  Administered 2021-07-07 – 2021-07-10 (×3): 50 ug via ORAL
  Filled 2021-07-06 (×4): qty 1

## 2021-07-06 MED ORDER — SODIUM CHLORIDE 0.9 % IV BOLUS
1000.0000 mL | Freq: Once | INTRAVENOUS | Status: AC
  Administered 2021-07-06: 1000 mL via INTRAVENOUS

## 2021-07-06 MED ORDER — POLYETHYLENE GLYCOL 3350 17 G PO PACK
17.0000 g | PACK | Freq: Every day | ORAL | Status: DC
  Administered 2021-07-07 – 2021-07-10 (×4): 17 g via ORAL
  Filled 2021-07-06 (×4): qty 1

## 2021-07-06 MED ORDER — DOCUSATE SODIUM 100 MG PO CAPS
100.0000 mg | ORAL_CAPSULE | Freq: Every day | ORAL | Status: DC
  Administered 2021-07-07 – 2021-07-09 (×3): 100 mg via ORAL
  Filled 2021-07-06 (×4): qty 1

## 2021-07-06 MED ORDER — METOPROLOL TARTRATE 5 MG/5ML IV SOLN
5.0000 mg | Freq: Once | INTRAVENOUS | Status: AC
  Administered 2021-07-06: 5 mg via INTRAVENOUS
  Filled 2021-07-06: qty 5

## 2021-07-06 MED ORDER — ATENOLOL 50 MG PO TABS
50.0000 mg | ORAL_TABLET | Freq: Two times a day (BID) | ORAL | Status: DC
  Administered 2021-07-07 – 2021-07-10 (×8): 50 mg via ORAL
  Filled 2021-07-06: qty 1
  Filled 2021-07-06 (×2): qty 2
  Filled 2021-07-06 (×6): qty 1

## 2021-07-06 NOTE — ED Notes (Signed)
First Nurse Note:  Pt sent to ED by Dr. Laural Benes from [facility]. PCP concerned that pt is dehydrated and because of her blood pressure being high. States that pt is not eating or drinking.

## 2021-07-06 NOTE — ED Triage Notes (Signed)
Pt to ER from [facility] clinic where she was advised to be seen at the ER for further evaluation due to systolic BP's reading 180's and altered mental status. At baseline, patient is alert and oriented. At this time, patient is only able to answer name, unsure of month, unsure of birthday.   Son reports patient has been confused today, LKW yesterday around midday. Son reports he went to her home this morning and noticed that she hasn't taken one of her medications this morning, unsure which. Patient does take bp meds. Patient has caregivers present in the home 3 days a week.

## 2021-07-06 NOTE — ED Provider Notes (Signed)
[facility] Emergency Department Provider Note   ____________________________________________   I have reviewed the triage vital signs and the nursing notes.   HISTORY  Chief Complaint Altered Mental Status   History limited by and level 5 caveat due to: Altered Mental Status   HPI Nicole Ramsey is a 87 y.o. female who presents to the emergency department today because of concerns for altered mental status.  Apparently the patient is normally alert and oriented.  However today family noted patient to be more altered.  Last known well was yesterday.  Went to primary care doctor's office where they also appreciated her to be altered.  They also found the patient to be hypertensive.  Sent to the emergency department for further work-up and evaluation.  Patient unfortunately is unable to give any history.   Records reviewed. Per medical record review patient has a history of dementia, HTN.   Past Medical History:  Diagnosis Date   Hypertension    Thyroid disease     Patient Active Problem List   Diagnosis Date Noted   Failure to thrive in adult 11/06/2020   Protein-calorie malnutrition, severe 11/03/2020   Syncope 10/30/2020   Depression, major, recurrent, moderate (HCC) 10/27/2020   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 (HCC) 10/27/2020   Hypothyroid 10/27/2020   Hypertension 10/27/2020    History reviewed. No pertinent surgical history.  Prior to Admission medications   Medication Sig Start Date End Date Taking? Authorizing Provider  ALPRAZolam Prudy Feeler) 1 MG tablet Take 0.5 tablets (0.5 mg total) by mouth 2 (two) times daily as needed. 11/10/20   Marrion Coy, MD  amLODipine (NORVASC) 10 MG tablet Take 10 mg by mouth daily. 09/12/20   [provider]  atenolol (TENORMIN) 50 MG tablet Take 1 tablet (50 mg total) by mouth 2 (two) times daily. 04/27/21 05/27/21  Merwyn Katos, MD  Bacillus Coagulans-Inulin (PROBIOTIC)  1-250 BILLION-MG CAPS Take 1 capsule by mouth daily. 06/26/21   Nita Sickle, MD  bisacodyl (DULCOLAX) 5 MG EC tablet Take 1 tablet (5 mg total) by mouth daily as needed for moderate constipation. 06/26/21 06/26/22  Nita Sickle, MD  docusate sodium (COLACE) 100 MG capsule Take 1 capsule (100 mg total) by mouth at bedtime. 06/26/21 06/26/22  Nita Sickle, MD  feeding supplement (ENSURE ENLIVE / ENSURE PLUS) LIQD Take 237 mLs by mouth 2 (two) times daily between meals. 11/10/20   Marrion Coy, MD  levothyroxine (SYNTHROID) 50 MCG tablet Take 50 mcg by mouth daily. 10/11/20   [provider]  meloxicam (MOBIC) 15 MG tablet Take 15 mg by mouth daily. 09/14/20   [provider]  omeprazole (PRILOSEC) 40 MG capsule Take 40 mg by mouth daily. 09/14/20   [provider]  QUEtiapine (SEROQUEL) 25 MG tablet Take 25 mg by mouth at bedtime. 10/11/20   [provider]  Vitamin D, Ergocalciferol, (DRISDOL) 1.25 MG (50000 UNIT) CAPS capsule Take 50,000 Units by mouth once a week. 05/18/20   [provider]    Allergies Aspirin  No family history on file.  Social History Social History   Tobacco Use   Smoking status: Never   Smokeless tobacco: Never  Substance Use Topics   Alcohol use: Not Currently   Drug use: Never    Review of Systems Unable to obtain secondary to altered mental status.  ____________________________________________   PHYSICAL EXAM:  VITAL SIGNS: ED Triage Vitals  Enc Vitals Group     BP 07/06/21 1550 Marland Kitchen)  222/88     Pulse Rate 07/06/21 1550 66     Resp 07/06/21 1550 18     Temp 07/06/21 1550 98.1 F (36.7 C)     Temp Source 07/06/21 1550 Oral     SpO2 07/06/21 1550 93 %     Weight 07/06/21 1551 147 lb 11.3 oz (67 kg)     Height 07/06/21 1551 5\' 7"  (1.702 m)     Head Circumference --      Peak Flow --      Pain Score --      Pain Loc --      Pain Edu? --      Excl. in GC? --      Constitutional: Awake and  alert. Not completely oriented.  Eyes: Conjunctivae are normal.  ENT      Head: Normocephalic and atraumatic.      Nose: No congestion/rhinnorhea.      Mouth/Throat: Mucous membranes are moist.      Neck: No stridor. Hematological/Lymphatic/Immunilogical: No cervical lymphadenopathy. Cardiovascular: Normal rate, regular rhythm.  No murmurs, rubs, or gallops.  Respiratory: Normal respiratory effort without tachypnea nor retractions. Breath sounds are clear and equal bilaterally. No wheezes/rales/rhonchi. Gastrointestinal: Soft and non tender. No rebound. No guarding.  Genitourinary: Deferred Musculoskeletal: Normal range of motion in all extremities. No lower extremity edema. Neurologic:  Awake, alert, not completely oriented. Moving all extremities.  Skin:  Skin is warm, dry and intact. No rash noted. Psychiatric: Mood and affect are normal. Speech and behavior are normal. Patient exhibits appropriate insight and judgment.  ____________________________________________    LABS (pertinent positives/negatives)  UA clear, not consistent with infection CBC wbc 5.4, hgb 14.0, plt 237 CMP na 125, k 3.7, glu 128, cr 0.92  ____________________________________________   EKG  I, , attending physician, personally viewed and interpreted this EKG  EKG Time: 1555 Rate: 67 Rhythm: normal sinus rhythm Axis: left axis deviation Intervals: qtc 460 QRS: RBBB ST changes: no st elevation Impression: abnormal ekg   ____________________________________________    RADIOLOGY  CT head No acute abnormality  MR brain No acute abnormality ____________________________________________   PROCEDURES  Procedures  ____________________________________________   INITIAL IMPRESSION / ASSESSMENT AND PLAN / ED COURSE  Pertinent labs & imaging results that were available during my care of the patient were reviewed by me and considered in my medical decision making (see chart for  details).   Patient presented to the emergency department today because of concerns for altered mental status.  Patient does live alone.  Normally awake and alert.  However on exam she is not completely oriented.  Work-up in the emergency department without clear etiology.  Her sodium level was slightly low as well as her chloride so I do think she is somewhat dehydrated although do not think this would necessarily explain the altered mental status.  Did obtain MRI which not show any acute stroke.  No obvious indication for significant infection.  In addition to the altered mental status patient's blood pressure was found to be very elevated here.  Initially tried multiple dose of metoprolol which did temporarily improve the blood pressure however it got bad again.  Because of this she was placed on nicardipine drip.  In discussion with the son who was visiting with his mother in the emergency department she did continue to be altered.  There is also some question about possible medication misadventure.  Given continued altered status will plan on admission for further work-up and management.  ____________________________________________   FINAL CLINICAL IMPRESSION(S) / ED DIAGNOSES  Final diagnoses:  Altered mental status, unspecified altered mental status type  Hyponatremia  Hypertension, unspecified type     Note: This dictation was prepared with Dragon dictation. Any transcriptional errors that result from this process are unintentional     Phineas Semen, MD 07/06/21 2257

## 2021-07-06 NOTE — H&P (Addendum)
History and Physical    Nicole Ramsey OAC:[id number] DOB: [birth year] DOA: 07/06/2021  PCP: Gracelyn Nurse, MD  Patient coming from: Home  I have personally briefly reviewed patient's old medical records in [facility]  Chief Complaint: AMS  HPI: Nicole Ramsey is a 87 y.o. female with medical history significant for vascular dementia, hypertension, type 2 diabetes, hypothyroidism, GERD, anxiety depression who was brought in by family today for concerns of altered mental status.  No family at bedside.  Patient alert and oriented to self and place only.  She cannot recall who brought her in and did not want to be hospitalized.  She reports having abdominal pain and points to her left lower quadrant but unsure for how long.  States she is constipated and cannot remember her last bowel movement.  Denies nausea or vomiting.  States she does not have good appetite.  I spoke with son over the phone and patient lives alone but has a home health aide that comes about 3 times a week and she also has other family members that bring her food.  He reported to ED physician earlier that she noticed some pills laying around is not sure if she is had some confusion with her medication.  She was brought into her PCP today who also noticed some changes in her mentation and advised her to be brought into the ED.  She was recently evaluated in the ED on 9/27 for lower abdominal pain but had negative work-up including CT abdomen and pelvis.  She was discharged with stool softening regimen.  ED Course: She was hypertensive up to systolic of 220/88 and was given IV metoprolol which temporarily improved her blood pressure but later had to be placed on Cardene infusion.  She was then later weaned off after her blood pressure improved significantly.  Her CBC was unremarkable for leukocytosis or anemia.  Sodium of 125, potassium 3.7, CBG of 128, creatinine of 0.92 from prior of 0.56.  UA was negative. She  had CT head and abdominal brain that was negative for any acute findings although there was some motion artifact.   Review of Systems: Unable to obtain since patient had waxing and waning altered mental status  Past Medical History:  Diagnosis Date   Hypertension    Thyroid disease     History reviewed. No pertinent surgical history.   reports that she has never smoked. She has never used smokeless tobacco. She reports that she does not currently use alcohol. She reports that she does not use drugs. Social History  Allergies  Allergen Reactions   Aspirin Other (See Comments)    Elevated blood pressure    No family history on file.   Prior to Admission medications   Medication Sig Start Date End Date Taking? Authorizing Provider  ALPRAZolam Prudy Feeler) 1 MG tablet Take 0.5 tablets (0.5 mg total) by mouth 2 (two) times daily as needed. 11/10/20  Yes Marrion Coy, MD  amLODipine (NORVASC) 10 MG tablet Take 10 mg by mouth daily. 09/12/20  Yes [provider]  atenolol (TENORMIN) 50 MG tablet Take 1 tablet (50 mg total) by mouth 2 (two) times daily. 04/27/21 07/06/21 Yes Bradler, Clent Jacks, MD  Bacillus Coagulans-Inulin (PROBIOTIC) 1-250 BILLION-MG CAPS Take 1 capsule by mouth daily. 06/26/21  Yes Don Perking, [place], MD  bisacodyl (DULCOLAX) 5 MG EC tablet Take 1 tablet (5 mg total) by mouth daily as needed for moderate constipation. 06/26/21 06/26/22 Yes Nita Sickle, MD  citalopram (  CELEXA) 10 MG tablet Take 10 mg by mouth daily.   Yes [provider]  cyanocobalamin (,VITAMIN B-12,) 1000 MCG/ML injection Inject 1,000 mcg into the muscle every 30 (thirty) days.   Yes [provider]  docusate sodium (COLACE) 100 MG capsule Take 1 capsule (100 mg total) by mouth at bedtime. 06/26/21 06/26/22 Yes Veronese, [place], MD  feeding supplement (ENSURE ENLIVE / ENSURE PLUS) LIQD Take 237 mLs by mouth 2 (two) times daily between meals. 11/10/20  Yes Marrion Coy, MD   hydrALAZINE (APRESOLINE) 25 MG tablet Take 25 mg by mouth 2 (two) times daily.   Yes [provider]  ibuprofen (ADVIL) 200 MG tablet Take 400 mg by mouth every 6 (six) hours as needed.   Yes [provider]  levothyroxine (SYNTHROID) 50 MCG tablet Take 50 mcg by mouth daily. 10/11/20  Yes [provider]  meclizine (ANTIVERT) 25 MG tablet Take 25 mg by mouth 3 (three) times daily as needed for dizziness.   Yes [provider]  meloxicam (MOBIC) 15 MG tablet Take 15 mg by mouth daily. 09/14/20  Yes [provider]  omeprazole (PRILOSEC) 40 MG capsule Take 40 mg by mouth daily. 09/14/20  Yes [provider]  QUEtiapine (SEROQUEL) 25 MG tablet Take 25 mg by mouth at bedtime. 10/11/20  Yes [provider]  Vitamin D, Ergocalciferol, (DRISDOL) 1.25 MG (50000 UNIT) CAPS capsule Take 50,000 Units by mouth once a week. 05/18/20  Yes [provider]    Physical Exam: Vitals:   07/06/21 2055 07/06/21 2120 07/06/21 2219 07/06/21 2300  BP: (!) 173/69 (!) 156/65 139/73 (!) 165/61  Pulse: 74 72 73 74  Resp: 17 17 17    Temp:      TempSrc:      SpO2: 93% 93% 95% 96%  Weight:      Height:        Constitutional: NAD, calm, comfortable, thin elderly female lying in bed Vitals:   07/06/21 2055 07/06/21 2120 07/06/21 2219 07/06/21 2300  BP: (!) 173/69 (!) 156/65 139/73 (!) 165/61  Pulse: 74 72 73 74  Resp: 17 17 17    Temp:      TempSrc:      SpO2: 93% 93% 95% 96%  Weight:      Height:       Eyes: PERRL, lids and conjunctivae normal ENMT: Mucous membranes are moist. Posterior pharynx clear of any exudate or lesions.Normal dentition.  Neck: normal, supple, no masses, no thyromegaly Respiratory: clear to auscultation bilaterally, no wheezing, no crackles. Normal respiratory effort. No accessory muscle use.  Cardiovascular: Regular rate and rhythm, no murmurs / rubs / gallops. No extremity edema. 2+ pedal pulses. No carotid bruits.   Abdomen: Soft, nondistended with voluntary guarding of the left and mid lower quadrant.  No rebound tenderness.  No mass palpated.  Positive bowel sounds throughout. Musculoskeletal: no clubbing / cyanosis. No joint deformity upper and lower extremities. Normal muscle tone.  Skin: no rashes, lesions, ulcers. No induration Neurologic: Alert and oriented to self and place only CN 2-12 grossly intact. Sensation intact Psychiatric: Waxing and waning mentation, able to answer questions appropriately at times but then would be confused shortly after    Labs on Admission: I have personally reviewed following labs and imaging studies  CBC: Recent Labs  Lab 07/06/21 1554  WBC 5.4  NEUTROABS 3.2  HGB 14.0  HCT 41.7  MCV 87.1  PLT 237   Basic Metabolic Panel: Recent Labs  Lab  07/06/21 1805  NA 125*  K 3.7  CL 88*  CO2 28  GLUCOSE 128*  BUN 7*  CREATININE 0.92  CALCIUM 9.4   GFR: Estimated Creatinine Clearance: 37.9 mL/min (by C-G formula based on SCr of 0.92 mg/dL). Liver Function Tests: Recent Labs  Lab 07/06/21 1805  AST 16  ALT 12  ALKPHOS 54  BILITOT 0.9  PROT 8.0  ALBUMIN 4.1   No results for input(s): LIPASE, AMYLASE in the last 168 hours. No results for input(s): AMMONIA in the last 168 hours. Coagulation Profile: Recent Labs  Lab 07/06/21 1554  INR 1.0   Cardiac Enzymes: No results for input(s): CKTOTAL, CKMB, CKMBINDEX, TROPONINI in the last 168 hours. BNP (last 3 results) No results for input(s): PROBNP in the last 8760 hours. HbA1C: No results for input(s): HGBA1C in the last 72 hours. CBG: No results for input(s): GLUCAP in the last 168 hours. Lipid Profile: No results for input(s): CHOL, HDL, LDLCALC, TRIG, CHOLHDL, LDLDIRECT in the last 72 hours. Thyroid Function Tests: No results for input(s): TSH, T4TOTAL, FREET4, T3FREE, THYROIDAB in the last 72 hours. Anemia Panel: No results for input(s): VITAMINB12, FOLATE, FERRITIN, TIBC, IRON,  RETICCTPCT in the last 72 hours. Urine analysis:    Component Value Date/Time   COLORURINE YELLOW (A) 07/06/2021 1805   APPEARANCEUR CLEAR (A) 07/06/2021 1805   LABSPEC 1.006 07/06/2021 1805   PHURINE 8.0 07/06/2021 1805   GLUCOSEU NEGATIVE 07/06/2021 1805   HGBUR NEGATIVE 07/06/2021 1805   BILIRUBINUR NEGATIVE 07/06/2021 1805   KETONESUR NEGATIVE 07/06/2021 1805   PROTEINUR 30 (A) 07/06/2021 1805   NITRITE NEGATIVE 07/06/2021 1805   LEUKOCYTESUR NEGATIVE 07/06/2021 1805    Radiological Exams on Admission: CT HEAD WO CONTRAST  Result Date: 07/06/2021 CLINICAL DATA:  Mental status change EXAM: CT HEAD WITHOUT CONTRAST TECHNIQUE: Contiguous axial images were obtained from the base of the skull through the vertex without intravenous contrast. COMPARISON:  CT 10/27/2020 FINDINGS: Brain: No acute territorial infarction, hemorrhage or intracranial mass. Patchy white matter hypodensity most likely chronic small vessel ischemic change. Mild atrophy. Stable ventricle size. Vascular: No hyperdense vessels.  Carotid vascular calcification. Skull: Normal. Negative for fracture or focal lesion. Sinuses/Orbits: No acute finding. Other: None IMPRESSION: 1. No CT evidence for acute intracranial abnormality. 2. Mild atrophy and chronic small vessel ischemic changes of white matter Electronically Signed   By: Jasmine Pang M.D.   On: 07/06/2021 16:35   MR BRAIN WO CONTRAST  Result Date: 07/06/2021 CLINICAL DATA:  Initial evaluation for altered mental status. EXAM: MRI HEAD WITHOUT CONTRAST TECHNIQUE: Multiplanar, multiecho pulse sequences of the brain and surrounding structures were obtained without intravenous contrast. COMPARISON:  Prior CT from earlier the same day. FINDINGS: Brain: Examination moderately to severely degraded by motion artifact. Generalized age-related cerebral atrophy. Patchy and confluent T2/FLAIR hyperintensity involving the periventricular and deep white matter both cerebral  hemispheres, most likely related to chronic microvascular ischemic disease, moderately advanced in nature. Multiple scattered remote lacunar infarcts present about the bilateral basal ganglia, thalami, and pons. Patchy FLAIR signal abnormality at the ventral medulla also favored to reflect remote ischemic change. No abnormal foci of restricted diffusion to suggest acute or subacute ischemia. Gray-white matter differentiation otherwise maintained. No areas of remote cortical infarction. No definite evidence for acute or chronic intracranial hemorrhage on this motion degraded exam. No mass lesion, midline shift or mass effect. No hydrocephalus or extra-axial fluid collection. Pituitary gland grossly within normal limits. Vascular: Major intracranial vascular flow voids  are grossly maintained at the skull base. Skull and upper cervical spine: Craniocervical junction within normal limits. Bone marrow signal intensity normal. No scalp soft tissue abnormality. Sinuses/Orbits: Prior ocular lens replacement on the right. Globes and orbital soft tissues demonstrate no obvious abnormality. Paranasal sinuses are largely clear. No significant mastoid effusion. Other: None. IMPRESSION: 1. Technically limited exam due to extensive motion artifact. 2. No acute intracranial abnormality. 3. Age-related cerebral atrophy with moderately advanced chronic microvascular ischemic disease, with multiple remote lacunar infarcts involving the bilateral basal ganglia, thalami, and pons. Electronically Signed   By: Rise Mu M.D.   On: 07/06/2021 22:26      Assessment/Plan  Hypertensive urgency - Presented with systolic of 220 initially requiring Cardene infusion but has been weaned off - Will resume home amlodipine, atenolol and hydralazine - PRN labetalol for systolic greater than 180 and/or diastolic greater than 110  Hyponatremia -Na of 126. Has received 1L NS fluid. Continue gentle IV 50cc/hr overnight and follow  Na  AKI -creatinine of 0.92 up form 0.56 -follow creatinine after fluids -avoid nephrotoxic agent  Altered mental status Likely encephalopathy from hypertensive urgency, hyponatremia and worsening waxing and waning of her vascular dementia -Treatment as above for hyponatremia and hypertension -Check TSH given history of hypothyroidism.  Also check vitamin B12 with history of B12 deficiency  Type 2 diabetes - No hyperglycemia on admit.  Follow with daily CBG  Hypothyroidism -continue levothyroxine.  Check TSH  Anxiety/depression - Continue Celexa  Vascular dementia - Patient currently alert and oriented only to self and place -continue Seroquel  DVT prophylaxis:.Lovenox Code Status: Full Family Communication: Plan discussed with son Albertina Leise over the phone disposition Plan: Home with observation Consults called:  Admission status: Observation  Level of care: Progressive Cardiac  Status is: Observation  The patient remains OBS appropriate and will d/c before 2 midnights.  Dispo: The patient is from: Home              Anticipated d/c is to: Home              Patient currently is not medically stable to d/c.   Difficult to place patient No         Anselm Jungling DO [facility]   If 7PM-7AM, please contact night-coverage [email or web address]   07/06/2021, 11:49 PM

## 2021-07-07 ENCOUNTER — Inpatient Hospital Stay: Payer: Medicare Other

## 2021-07-07 DIAGNOSIS — I160 Hypertensive urgency: Secondary | ICD-10-CM

## 2021-07-07 DIAGNOSIS — Z79899 Other long term (current) drug therapy: Secondary | ICD-10-CM | POA: Diagnosis not present

## 2021-07-07 DIAGNOSIS — I959 Hypotension, unspecified: Secondary | ICD-10-CM | POA: Diagnosis present

## 2021-07-07 DIAGNOSIS — N179 Acute kidney failure, unspecified: Secondary | ICD-10-CM | POA: Diagnosis present

## 2021-07-07 DIAGNOSIS — I10 Essential (primary) hypertension: Secondary | ICD-10-CM | POA: Diagnosis present

## 2021-07-07 DIAGNOSIS — E871 Hypo-osmolality and hyponatremia: Secondary | ICD-10-CM | POA: Diagnosis present

## 2021-07-07 DIAGNOSIS — Z886 Allergy status to analgesic agent status: Secondary | ICD-10-CM | POA: Diagnosis not present

## 2021-07-07 DIAGNOSIS — F0154 Vascular dementia, unspecified severity, with anxiety: Secondary | ICD-10-CM | POA: Diagnosis present

## 2021-07-07 DIAGNOSIS — F32A Depression, unspecified: Secondary | ICD-10-CM

## 2021-07-07 DIAGNOSIS — E119 Type 2 diabetes mellitus without complications: Secondary | ICD-10-CM

## 2021-07-07 DIAGNOSIS — K5900 Constipation, unspecified: Secondary | ICD-10-CM | POA: Diagnosis present

## 2021-07-07 DIAGNOSIS — K219 Gastro-esophageal reflux disease without esophagitis: Secondary | ICD-10-CM | POA: Diagnosis present

## 2021-07-07 DIAGNOSIS — F419 Anxiety disorder, unspecified: Secondary | ICD-10-CM | POA: Diagnosis not present

## 2021-07-07 DIAGNOSIS — E538 Deficiency of other specified B group vitamins: Secondary | ICD-10-CM | POA: Diagnosis present

## 2021-07-07 DIAGNOSIS — F0150 Vascular dementia without behavioral disturbance: Secondary | ICD-10-CM

## 2021-07-07 DIAGNOSIS — R4182 Altered mental status, unspecified: Secondary | ICD-10-CM

## 2021-07-07 DIAGNOSIS — Z20822 Contact with and (suspected) exposure to covid-19: Secondary | ICD-10-CM | POA: Diagnosis present

## 2021-07-07 DIAGNOSIS — E039 Hypothyroidism, unspecified: Secondary | ICD-10-CM | POA: Diagnosis present

## 2021-07-07 DIAGNOSIS — E0781 Sick-euthyroid syndrome: Secondary | ICD-10-CM | POA: Diagnosis present

## 2021-07-07 DIAGNOSIS — G928 Other toxic encephalopathy: Secondary | ICD-10-CM | POA: Diagnosis present

## 2021-07-07 DIAGNOSIS — Z79890 Hormone replacement therapy (postmenopausal): Secondary | ICD-10-CM | POA: Diagnosis not present

## 2021-07-07 DIAGNOSIS — R410 Disorientation, unspecified: Secondary | ICD-10-CM | POA: Diagnosis not present

## 2021-07-07 LAB — BASIC METABOLIC PANEL
Anion gap: 10 (ref 5–15)
Anion gap: 10 (ref 5–15)
Anion gap: 9 (ref 5–15)
BUN: 6 mg/dL — ABNORMAL LOW (ref 8–23)
BUN: 7 mg/dL — ABNORMAL LOW (ref 8–23)
BUN: 8 mg/dL (ref 8–23)
CO2: 26 mmol/L (ref 22–32)
CO2: 23 mmol/L (ref 22–32)
CO2: 25 mmol/L (ref 22–32)
Calcium: 9.5 mg/dL (ref 8.9–10.3)
Calcium: 9.4 mg/dL (ref 8.9–10.3)
Calcium: 9.9 mg/dL (ref 8.9–10.3)
Chloride: 92 mmol/L — ABNORMAL LOW (ref 98–111)
Chloride: 94 mmol/L — ABNORMAL LOW (ref 98–111)
Chloride: 94 mmol/L — ABNORMAL LOW (ref 98–111)
Creatinine, Ser: 0.61 mg/dL (ref 0.44–1.00)
Creatinine, Ser: 0.64 mg/dL (ref 0.44–1.00)
Creatinine, Ser: 0.72 mg/dL (ref 0.44–1.00)
GFR, Estimated: >60 mL/min (ref >60)
GFR, Estimated: >60 mL/min (ref >60)
GFR, Estimated: >60 mL/min (ref >60)
Glucose, Bld: 129 mg/dL — ABNORMAL HIGH (ref 70–99)
Glucose, Bld: 139 mg/dL — ABNORMAL HIGH (ref 70–99)
Glucose, Bld: 130 mg/dL — ABNORMAL HIGH (ref 70–99)
Potassium: 3.3 mmol/L — ABNORMAL LOW (ref 3.5–5.1)
Potassium: 3.8 mmol/L (ref 3.5–5.1)
Potassium: 4.2 mmol/L (ref 3.5–5.1)
Sodium: 128 mmol/L — ABNORMAL LOW (ref 135–145)
Sodium: 127 mmol/L — ABNORMAL LOW (ref 135–145)
Sodium: 128 mmol/L — ABNORMAL LOW (ref 135–145)

## 2021-07-07 LAB — T4, FREE: Free T4: 0.9 ng/dL (ref 0.61–1.12)

## 2021-07-07 LAB — CBC
HCT: 40.5 % (ref 36.0–46.0)
Hemoglobin: 13.6 g/dL (ref 12.0–15.0)
MCH: 29.1 pg (ref 26.0–34.0)
MCHC: 33.6 g/dL (ref 30.0–36.0)
MCV: 86.5 fL (ref 80.0–100.0)
Platelets: 255 10*3/uL (ref 150–400)
RBC: 4.68 MIL/uL (ref 3.87–5.11)
RDW: 15 % (ref 11.5–15.5)
WBC: 7.9 10*3/uL (ref 4.0–10.5)
nRBC: 0 % (ref 0.0–0.2)

## 2021-07-07 LAB — CBG MONITORING, ED: Glucose-Capillary: 148 mg/dL — ABNORMAL HIGH (ref 70–99)

## 2021-07-07 LAB — AMMONIA: Ammonia: <10 umol/L (ref 9–35)

## 2021-07-07 LAB — VITAMIN B12: Vitamin B-12: 658 pg/mL (ref 180–914)

## 2021-07-07 LAB — TSH: TSH: 12.809 u[IU]/mL — ABNORMAL HIGH (ref 0.350–4.500)

## 2021-07-07 MED ORDER — HYDRALAZINE HCL 20 MG/ML IJ SOLN
10.0000 mg | Freq: Once | INTRAMUSCULAR | Status: AC
  Administered 2021-07-07: 10 mg via INTRAVENOUS
  Filled 2021-07-07: qty 1

## 2021-07-07 MED ORDER — LABETALOL HCL 5 MG/ML IV SOLN
5.0000 mg | Freq: Four times a day (QID) | INTRAVENOUS | Status: DC | PRN
  Administered 2021-07-07: 5 mg via INTRAVENOUS
  Filled 2021-07-07: qty 4

## 2021-07-07 MED ORDER — HYDRALAZINE HCL 20 MG/ML IJ SOLN
10.0000 mg | Freq: Four times a day (QID) | INTRAMUSCULAR | Status: DC | PRN
  Administered 2021-07-07 (×2): 10 mg via INTRAVENOUS
  Filled 2021-07-07 (×2): qty 1

## 2021-07-07 MED ORDER — ONDANSETRON HCL 4 MG/2ML IJ SOLN
4.0000 mg | Freq: Four times a day (QID) | INTRAMUSCULAR | Status: DC | PRN
  Administered 2021-07-07: 4 mg via INTRAVENOUS
  Filled 2021-07-07: qty 2

## 2021-07-07 MED ORDER — POTASSIUM CHLORIDE CRYS ER 20 MEQ PO TBCR
40.0000 meq | EXTENDED_RELEASE_TABLET | Freq: Once | ORAL | Status: AC
  Administered 2021-07-07: 40 meq via ORAL
  Filled 2021-07-07: qty 2

## 2021-07-07 NOTE — Progress Notes (Signed)
PROGRESS NOTE  Nicole Ramsey LOV:[id number] DOB: 1926/11/18 DOA: 07/06/2021 PCP: Gracelyn Nurse, MD  Brief History    Nicole Ramsey is a 87 y.o. female with medical history significant for vascular dementia, hypertension, type 2 diabetes, hypothyroidism, GERD, anxiety depression who was brought in by family today for concerns of altered mental status.   No family at bedside.  Patient alert and oriented to self and place only.  She cannot recall who brought her in and did not want to be hospitalized.  She reports having abdominal pain and points to her left lower quadrant but unsure for how long.  States she is constipated and cannot remember her last bowel movement.  Denies nausea or vomiting.  States she does not have good appetite.  I spoke with son over the phone and patient lives alone but has a home health aide that comes about 3 times a week and she also has other family members that bring her food.  He reported to ED physician earlier that she noticed some pills laying around is not sure if she is had some confusion with her medication.  She was brought into her PCP today who also noticed some changes in her mentation and advised her to be brought into the ED.   She was recently evaluated in the ED on 9/27 for lower abdominal pain but had negative work-up including CT abdomen and pelvis.  She was discharged with stool softening regimen.   ED Course: She was hypertensive up to systolic of 220/88 and was given IV metoprolol which temporarily improved her blood pressure but later had to be placed on Cardene infusion.  She was then later weaned off after her blood pressure improved significantly.   Her CBC was unremarkable for leukocytosis or anemia.  Sodium of 125, potassium 3.7, CBG of 128, creatinine of 0.92 from prior of 0.56.  UA was negative. She had CT head and abdominal brain that was negative for any acute findings although there was some motion artifact.  Code Stroke called on  this patient early this afternoon. Dr. Wilford Corner and I attended. Stat CT head without contrast was ordered. Results pending. The patient was discussed with her sister, Hilda Lias. It seems that the patient's mental status changes that prompted the code stroke are not unusual for this patient with hyponatremia and uncontrolled hypertension.   Consultants  Neurology  Procedures  None  Antibiotics   Anti-infectives (From admission, onward)    None      Subjective  The patient is awake and alert. She is verbally and appropriately responsive, but very soft spoken. She does not know where she is or why she is here. No new complaints.   Objective   Vitals:  Vitals:   07/07/21 1400 07/07/21 1459  BP: (!) 149/73 (!) 186/70  Pulse: 66 66  Resp: (!) 21 20  Temp: 98.2 F (36.8 C) 97.7 F (36.5 C)  SpO2: 96% 98%    Exam:  Constitutional:  Awake, alert and oriented x 3. No acute distress. Respiratory:  CTA bilaterally, no w/r/r.  Respiratory effort normal. No retractions or accessory muscle use Cardiovascular:  RRR, no m/r/g No LE extremity edema   Normal pedal pulses Abdomen:  Abdomen appears normal; no tenderness or masses No hernias No HSM Musculoskeletal:  Digits/nails BUE: no clubbing, cyanosis, petechiae, infection exam of joints, bones, muscles of at least one of following: head/neck, RUE, LUE, RLE, LLE   Skin:  No rashes, lesions, ulcers palpation of skin:  no induration or nodules Neurologic:  CN 2-12 intact Sensation all 4 extremities intact Psychiatric:  Mental status - somewhat confused. Waxing and waning according to her sister. Orientation to person  I have personally reviewed the following:   Today's Data  Vitals  Lab Data  CBC, BMP  Micro Data  COVID negative  Imaging  CT head Repeat CT head - pending  Cardiology Data  EKG  Scheduled Meds:  amLODipine  10 mg Oral Daily   atenolol  50 mg Oral BID   citalopram  10 mg Oral Daily   docusate  sodium  100 mg Oral QHS   enoxaparin (LOVENOX) injection  40 mg Subcutaneous Q24H   feeding supplement  237 mL Oral BID BM   hydrALAZINE  25 mg Oral BID   levothyroxine  50 mcg Oral Q0600   pantoprazole  40 mg Oral Daily   polyethylene glycol  17 g Oral Daily   QUEtiapine  25 mg Oral QHS   Continuous Infusions:  Principal Problem:   AMS (altered mental status) Active Problems:   Hypothyroid   Hypertensive urgency   Type 2 diabetes mellitus (HCC)   Vascular dementia (HCC)   Anxiety   Depression   Hyponatremia   AKI (acute kidney injury) (HCC)   LOS: 0 days   A & P  Hypertensive urgency - Presented with systolic of 220 initially requiring Cardene infusion but has been weaned off - Will resume home amlodipine, atenolol and hydralazine - PRN labetalol for systolic greater than 180 and/or diastolic greater than 110   Hyponatremia -Na of 126. Has received 1L NS fluid. Continue gentle IV 50cc/hr overnight and follow Na   AKI -creatinine of 0.92 up form 0.56 -follow creatinine after fluids -avoid nephrotoxic agent   Altered mental status Likely encephalopathy from hypertensive urgency, hyponatremia and worsening waxing and waning of her vascular dementia -Treatment as above for hyponatremia and hypertension -Check TSH given history of hypothyroidism.  Also check vitamin B12 with history of B12 deficiency   Type 2 diabetes - No hyperglycemia on admit.  FSBS have been 90-143 for the last 24 hours.   Hypothyroidism -continue levothyroxine. TSH 12.809. FT3, FT4 pending.   Anxiety/depression - Continue Celexa   Vascular dementia - Patient currently alert and oriented only to self  -continue Seroquel  I have seen and examined this patient myself. I have spent 34 minutes in her evaluation and care.   DVT prophylaxis:.Lovenox Code Status: Full Family Communication: I have discussed the patient with her sister Hilda Lias. All questions answered to the best of my  ability. Disposition Plan: Home with observation Consults called: Neurology Admission status: Inpatient   Level of care: Progressive Cardiac   Dispo: The patient is from: Home              Anticipated d/c is to: tbd              Patient currently is not medically stable to d/c.              Difficult to place patient No  Kia Varnadore, DO [facility] Direct contact: see [email or web address]  7PM-7AM contact night coverage as above 07/07/2021, 4:18 PM  LOS: 0 days

## 2021-07-07 NOTE — Plan of Care (Addendum)
RN came into room about 1300 and noticed patients mouth full of choc pudding and and "having some trouble."  I realized she didn't seem to want to swallow and wanted to spit it out instead.?? After spitting out the pudding - it was noted that patient was leaning to the LF and also was not answering simple questions.  Patient was newly non-verbal and holding her head with both hands.  When asked if she was in pain - once again no answer.  Patient vomitted again and vitals presented as WNL. (Zofran pushed.)   Code stroke called (LKW 1245)- Chrg came and Dr. Debera Lat.  Stroke camera begun activated. Upon  Dr. Virgina Organ - patient seemed to be responding a little better (few words and able to follow some instructions. Stat CT and Xrays of Head and ABD completed. STAT BMP, CMP and Ammonia ordered - Lab notified.

## 2021-07-07 NOTE — Consult Note (Signed)
Neurology Consultation  Reason for Consult: Code stroke for altered mental status and confusion along with vomiting and some questionable left-sided weakness/listing to the left Referring Physician: Dr. Gerri Lins  CC: Altered mental status, decreased responsiveness, listing to the left  History is obtained from: Chart, patient's RN, patient's primary hospitalist and patient's sister  HPI: Nicole Ramsey is a 87 y.o. female past medical history of hypertension, dementia, type 2 diabetes, hypothyroidism, anxiety, depression brought into the emergency room yesterday for concerns for altered mental status.  On arrival she was unable to provide much reliable history but complained of abdominal pain pointing more towards left lower quadrant.  She lives at home-has an aide that comes in 3 times a week to assist.  Family took her to a family doctor for concerns for altered mental status, noted to be extremely hypertensive and family doctor recommended that she be brought to the emergency room, where she was also noted to have systolic blood pressure in the 220s, sodium of 125 and was admitted for further medical management.  She was still waiting in the emergency room for a room assignment when this afternoon it was noted that her level of responsiveness diminished suddenly.  At around 12:45 PM, she was responding to the nurse.  At around the time of activation of the code stroke, she did not respond very well.  She was awake, attempting to vomit in a emesis bag and also appeared to be listing to the left.  Due to the sudden onset of focal neurological deficits, code stroke was activated. She was taken in for a stat CT head-which is normal, see detailed read in the chart Sister was visiting her, who was sitting outside the room when the code stroke was activated.  She reports that the patient has not been like herself and has been confused for the past couple of days.  The sister arrived about 30 to 40 minutes  prior to the code stroke initiation and said that the patient had not really talk to her much since then. Going by all the history together, it is unclear when her last known well was-I have to presume that her last known well was prior to this visit    LKW: Unclear.  According to the nurse she was somewhat more responsive at 1245 and became a little less responsive after but the sister says that she has been now confused for days tpa given?: no, unclear last known well Premorbid modified Rankin scale (mRS): 3  ROS: Unable to reliably ascertain due to altered mental status  Past Medical History:  Diagnosis Date   Hypertension    Thyroid disease      No family history on file.   Social History:   reports that she has never smoked. She has never used smokeless tobacco. She reports that she does not currently use alcohol. She reports that she does not use drugs.  Medications  Current Facility-Administered Medications:    amLODipine (NORVASC) tablet 10 mg, 10 mg, Oral, Daily, Tu, Ching T, DO, 10 mg at 07/07/21 1110   atenolol (TENORMIN) tablet 50 mg, 50 mg, Oral, BID, Tu, Ching T, DO, 50 mg at 07/07/21 1112   citalopram (CELEXA) tablet 10 mg, 10 mg, Oral, Daily, Tu, Ching T, DO, 10 mg at 07/07/21 1111   docusate sodium (COLACE) capsule 100 mg, 100 mg, Oral, QHS, Tu, Ching T, DO   enoxaparin (LOVENOX) injection 40 mg, 40 mg, Subcutaneous, Q24H, Tu, Ching T, DO, 40 mg  at 07/07/21 1112   feeding supplement (ENSURE ENLIVE / ENSURE PLUS) liquid 237 mL, 237 mL, Oral, BID BM, Tu, Ching T, DO, 237 mL at 07/07/21 1112   hydrALAZINE (APRESOLINE) injection 10 mg, 10 mg, Intravenous, Q6H PRN, Manuela Schwartz, NP, 10 mg at 07/07/21 1110   hydrALAZINE (APRESOLINE) tablet 25 mg, 25 mg, Oral, BID, Tu, Ching T, DO, 25 mg at 07/07/21 1111   labetalol (NORMODYNE) injection 5 mg, 5 mg, Intravenous, Q6H PRN, Tu, Ching T, DO, 5 mg at 07/07/21 0205   levothyroxine (SYNTHROID) tablet 50 mcg, 50 mcg, Oral,  Q0600, Tu, Ching T, DO, 50 mcg at 07/07/21 0541   ondansetron (ZOFRAN) injection 4 mg, 4 mg, Intravenous, Q6H PRN, Swayze, Ava, DO, 4 mg at 07/07/21 1310   pantoprazole (PROTONIX) EC tablet 40 mg, 40 mg, Oral, Daily, Tu, Ching T, DO, 40 mg at 07/07/21 1122   polyethylene glycol (MIRALAX / GLYCOLAX) packet 17 g, 17 g, Oral, Daily, Tu, Ching T, DO, 17 g at 07/07/21 1113   QUEtiapine (SEROQUEL) tablet 25 mg, 25 mg, Oral, QHS, Tu, Ching T, DO, 25 mg at 07/07/21 0034  Current Outpatient Medications:    ALPRAZolam (XANAX) 1 MG tablet, Take 0.5 tablets (0.5 mg total) by mouth 2 (two) times daily as needed., Disp: 12 tablet, Rfl: 0   amLODipine (NORVASC) 10 MG tablet, Take 10 mg by mouth daily., Disp: , Rfl:    atenolol (TENORMIN) 50 MG tablet, Take 1 tablet (50 mg total) by mouth 2 (two) times daily., Disp: 60 tablet, Rfl: 0   Bacillus Coagulans-Inulin (PROBIOTIC) 1-250 BILLION-MG CAPS, Take 1 capsule by mouth daily., Disp: 120 capsule, Rfl: 0   bisacodyl (DULCOLAX) 5 MG EC tablet, Take 1 tablet (5 mg total) by mouth daily as needed for moderate constipation., Disp: 30 tablet, Rfl: 1   citalopram (CELEXA) 10 MG tablet, Take 10 mg by mouth daily., Disp: , Rfl:    cyanocobalamin (,VITAMIN B-12,) 1000 MCG/ML injection, Inject 1,000 mcg into the muscle every 30 (thirty) days., Disp: , Rfl:    docusate sodium (COLACE) 100 MG capsule, Take 1 capsule (100 mg total) by mouth at bedtime., Disp: 30 capsule, Rfl: 2   feeding supplement (ENSURE ENLIVE / ENSURE PLUS) LIQD, Take 237 mLs by mouth 2 (two) times daily between meals., Disp: 237 mL, Rfl: 12   hydrALAZINE (APRESOLINE) 25 MG tablet, Take 25 mg by mouth 2 (two) times daily., Disp: , Rfl:    ibuprofen (ADVIL) 200 MG tablet, Take 400 mg by mouth every 6 (six) hours as needed., Disp: , Rfl:    levothyroxine (SYNTHROID) 50 MCG tablet, Take 50 mcg by mouth daily., Disp: , Rfl:    meclizine (ANTIVERT) 25 MG tablet, Take 25 mg by mouth 3 (three) times daily as  needed for dizziness., Disp: , Rfl:    meloxicam (MOBIC) 15 MG tablet, Take 15 mg by mouth daily., Disp: , Rfl:    omeprazole (PRILOSEC) 40 MG capsule, Take 40 mg by mouth daily., Disp: , Rfl:    QUEtiapine (SEROQUEL) 25 MG tablet, Take 25 mg by mouth at bedtime., Disp: , Rfl:    Vitamin D, Ergocalciferol, (DRISDOL) 1.25 MG (50000 UNIT) CAPS capsule, Take 50,000 Units by mouth once a week., Disp: , Rfl:    Exam: Current vital signs: BP (!) 193/78 (BP Location: Right Arm)   Pulse 64   Temp 97.8 F (36.6 C) (Oral)   Resp 19   Ht 5\' 7"  (1.702 m)   Wt  67 kg   SpO2 97%   BMI 23.13 kg/m  Vital signs in last 24 hours: Temp:  [97.8 F (36.6 C)-98.5 F (36.9 C)] 97.8 F (36.6 C) (10/08 1100) Pulse Rate:  [64-89] 64 (10/08 1100) Resp:  [16-25] 19 (10/08 1100) BP: (139-233)/(57-99) 193/78 (10/08 1100) SpO2:  [93 %-100 %] 97 % (10/08 1100) Weight:  [67 kg] 67 kg (10/07 1551) General: Awake alert, and somewhat distressed due to retching and vomiting HEENT: Normocephalic/atraumatic CVS: Regular rate rhythm Abdomen nontender nondistended Neurological exam She is awake, alert, was able to tell me her name, she is extremely hypophonic with mild dysarthria. She got her age Kandace Blitz she is 87 years old. Could not tell me the month Reduced attention concentration No gross aphasia noted Cranial nerves II to XII are also grossly intact Motor examination with no drift in any of the 4 extremities Sensation intact to light touch in all 4 extremities No obvious dysmetria NIH stroke scale 1a Level of Conscious.: 0 1b LOC Questions: 2 1c LOC Commands: 0 2 Best Gaze: 0 3 Visual: 0 4 Facial Palsy: 0 5a Motor Arm - left: 0 5b Motor Arm - Right: 0 6a Motor Leg - Left: 0 6b Motor Leg - Right: 0 7 Limb Ataxia: 0 8 Sensory: 0 9 Best Language: 0 10 Dysarthria: 1 11 Extinct. and Inatten.: 0 TOTAL: 3  Labs I have reviewed labs in epic and the results pertinent to this consultation  are:   CBC    Component Value Date/Time   WBC 5.4 07/06/2021 1554   RBC 4.79 07/06/2021 1554   HGB 14.0 07/06/2021 1554   HCT 41.7 07/06/2021 1554   PLT 237 07/06/2021 1554   MCV 87.1 07/06/2021 1554   MCH 29.2 07/06/2021 1554   MCHC 33.6 07/06/2021 1554   RDW 14.9 07/06/2021 1554   LYMPHSABS 1.5 07/06/2021 1554   MONOABS 0.6 07/06/2021 1554   EOSABS 0.0 07/06/2021 1554   BASOSABS 0.0 07/06/2021 1554    CMP     Component Value Date/Time   NA 128 (L) 07/07/2021 0601   K 3.3 (L) 07/07/2021 0601   CL 92 (L) 07/07/2021 0601   CO2 26 07/07/2021 0601   GLUCOSE 129 (H) 07/07/2021 0601   BUN 6 (L) 07/07/2021 0601   CREATININE 0.61 07/07/2021 0601   CALCIUM 9.5 07/07/2021 0601   PROT 8.0 07/06/2021 1805   ALBUMIN 4.1 07/06/2021 1805   AST 16 07/06/2021 1805   ALT 12 07/06/2021 1805   ALKPHOS 54 07/06/2021 1805   BILITOT 0.9 07/06/2021 1805   GFRNONAA >60 07/07/2021 0601   GFRAA >60 12/03/2018 1211  TSH 12 on this admission,  August 2022 TSH 37.3 in Care Everywhere. Sodium 128, came in with sodium of 125 yesterday.  Normal renal function.  UA not suggestive of UTI.  Imaging I have reviewed the images obtained:  CT-head from yesterday-no acute changes.  MRI yesterday negative for stroke CT head code stroke done today-no acute changes.  No bleed.  Assessment: 87 year old woman admitted for altered mental status, hypertensive urgency, hyponatremia and hypothyroidism with ongoing confusion for the past few days with sudden change in her ability to answer questions this afternoon. Repeat imaging in the form of CT head negative for acute process. The patient continues to have nausea and vomiting and had complained of abdominal pain on arrival. Has had abdominal work-up done last month which was unremarkable. Sister at bedside reports that she has been declining over the past couple of  weeks and has appeared more confused. On my examination, her exam is nonfocal in terms of  cranial nerve, sensory or motor findings.  She has some dysarthria and is not able to tell me her correct age of the current month but other than that seems to be her very soft-spoken/hypophonic self. I suspect that her dementia has progressed over time and the acute issues of hypertensive crisis/urgency as well as hypothyroidism are making her clinical picture of a multifactorial toxic metabolic encephalopathy  Impression: -Multifactorial toxic metabolic encephalopathy -Hypertensive urgency -Hyponatremia -Hypothyroidism -Abdominal pain  Recommendations: -I would not recommend repeating an MRI of the brain-1 was done yesterday and it was negative for stroke. -CBC -BMP -Ammonia level -B12 -Chest x-ray -Abdomen x-ray -Medical management of hyponatremia, hypertensive urgency and hypothyroidism as you are. Plan discussed with Dr. Gerri Lins. -- Milon Dikes, MD Neurologist [facility] Pager: [phone]

## 2021-07-08 DIAGNOSIS — I160 Hypertensive urgency: Secondary | ICD-10-CM | POA: Diagnosis not present

## 2021-07-08 DIAGNOSIS — R410 Disorientation, unspecified: Secondary | ICD-10-CM | POA: Diagnosis not present

## 2021-07-08 DIAGNOSIS — F32A Depression, unspecified: Secondary | ICD-10-CM | POA: Diagnosis not present

## 2021-07-08 DIAGNOSIS — G928 Other toxic encephalopathy: Secondary | ICD-10-CM | POA: Diagnosis not present

## 2021-07-08 DIAGNOSIS — N179 Acute kidney failure, unspecified: Secondary | ICD-10-CM | POA: Diagnosis not present

## 2021-07-08 LAB — CBC WITH DIFFERENTIAL/PLATELET
Abs Immature Granulocytes: 0.04 10*3/uL (ref 0.00–0.07)
Basophils Absolute: 0 10*3/uL (ref 0.0–0.1)
Basophils Relative: 0 %
Eosinophils Absolute: 0 10*3/uL (ref 0.0–0.5)
Eosinophils Relative: 0 %
HCT: 38.5 % (ref 36.0–46.0)
Hemoglobin: 13 g/dL (ref 12.0–15.0)
Immature Granulocytes: 1 %
Lymphocytes Relative: 19 %
Lymphs Abs: 1.4 10*3/uL (ref 0.7–4.0)
MCH: 29.7 pg (ref 26.0–34.0)
MCHC: 33.8 g/dL (ref 30.0–36.0)
MCV: 87.9 fL (ref 80.0–100.0)
Monocytes Absolute: 0.8 10*3/uL (ref 0.1–1.0)
Monocytes Relative: 11 %
Neutro Abs: 5.2 10*3/uL (ref 1.7–7.7)
Neutrophils Relative %: 69 %
Platelets: 219 10*3/uL (ref 150–400)
RBC: 4.38 MIL/uL (ref 3.87–5.11)
RDW: 15 % (ref 11.5–15.5)
WBC: 7.6 10*3/uL (ref 4.0–10.5)
nRBC: 0 % (ref 0.0–0.2)

## 2021-07-08 LAB — BASIC METABOLIC PANEL
Anion gap: 6 (ref 5–15)
BUN: 11 mg/dL (ref 8–23)
CO2: 28 mmol/L (ref 22–32)
Calcium: 9.5 mg/dL (ref 8.9–10.3)
Chloride: 97 mmol/L — ABNORMAL LOW (ref 98–111)
Creatinine, Ser: 0.69 mg/dL (ref 0.44–1.00)
GFR, Estimated: >60 mL/min (ref >60)
Glucose, Bld: 117 mg/dL — ABNORMAL HIGH (ref 70–99)
Potassium: 4.2 mmol/L (ref 3.5–5.1)
Sodium: 131 mmol/L — ABNORMAL LOW (ref 135–145)

## 2021-07-08 LAB — T3, FREE: T3, Free: 1.8 pg/mL — ABNORMAL LOW (ref 2.0–4.4)

## 2021-07-08 MED ORDER — HYDRALAZINE HCL 25 MG PO TABS
25.0000 mg | ORAL_TABLET | Freq: Three times a day (TID) | ORAL | Status: DC
  Administered 2021-07-08 – 2021-07-10 (×4): 25 mg via ORAL
  Filled 2021-07-08 (×5): qty 1

## 2021-07-08 MED ORDER — PANTOPRAZOLE SODIUM 40 MG PO TBEC
40.0000 mg | DELAYED_RELEASE_TABLET | Freq: Two times a day (BID) | ORAL | Status: DC
  Administered 2021-07-08 – 2021-07-10 (×4): 40 mg via ORAL
  Filled 2021-07-08 (×4): qty 1

## 2021-07-08 NOTE — Evaluation (Signed)
Physical Therapy Evaluation Patient Details Name: Nicole Ramsey MRN: [id number] DOB: December 17, 1926 Today's Date: 07/08/2021  History of Present Illness  87 y.o. female with medical history significant for vascular dementia, hypertension, type 2 diabetes, hypothyroidism, GERD, anxiety depression who was brought in by family for concerns of altered mental status.  Clinical Impression  Pt with flat affect, weak voice and inconsistent interaction with poor overall awareness.  She apparently does not normally need an AD, but clearly was reliant on the walker during ambulation/gait training and though she did not have any LOBs she consistently struggled to stay inside the walker, make appropriate adjustments despite repeated cues and generally showed an inability to manage at home alone.  Phoned son after session and he confirms that she does live alone and has a few hours of assist approximately every other day but was mobile and relatively before this significant change in mental status.       Recommendations for follow up therapy are one component of a multi-disciplinary discharge planning process, led by the attending physician.  Recommendations may be updated based on patient status, additional functional criteria and insurance authorization.  Follow Up Recommendations SNF;Supervision/Assistance - 24 hour    Equipment Recommendations  Rolling walker with 5" wheels    Recommendations for Other Services       Precautions / Restrictions Precautions Precautions: Fall Restrictions Weight Bearing Restrictions: No      Mobility  Bed Mobility Overal bed mobility: Modified Independent             General bed mobility comments: Pt able to get up to sitting EOB w/o assist, though she needed to be cued mutliple times to initiate transition    Transfers Overall transfer level: Needs assistance Equipment used: Rolling walker (2 wheeled) Transfers: Sit to/from Stand Sit to Stand: Min  assist         General transfer comment: Pt able to rise to standing with only little assist, but again slow to initiate movement and needed repeat cuing  Ambulation/Gait Ambulation/Gait assistance: Min assist Gait Distance (Feet): 100 Feet Assistive device: Rolling walker (2 wheeled)       General Gait Details: Pt reliant on walker/UEs (per conversation with son she does not have a walker and has never needed one).  She was not aware or steady enough to try w/o AD; struggled to stay inside walker and maintain at appropriate distance.  Vitals stable with activity, no LOBs but far from baseline.  Stairs            Wheelchair Mobility    Modified Rankin (Stroke Patients Only)       Balance Overall balance assessment: Needs assistance Sitting-balance support: No upper extremity supported Sitting balance-Leahy Scale: Good     Standing balance support: Bilateral upper extremity supported Standing balance-Leahy Scale: Poor Standing balance comment: leaning forward on walker (inconsistent amount during ambulation).  Poor safety awareness t/o standing mobility tasks.                             Pertinent Vitals/Pain Pain Assessment: No/denies pain    Home Living Family/patient expects to be discharged to:: Unsure                 Additional Comments: Pt unable to answer most questions.  Info gathered from prior notes and phone conversation with son: pt lives alone (2-3 steps to enter with rails) does not have/use walker, has 2-3 hours  of aides 2x/wk and weekly RN visit but apparently otherwise does not have a lot of daily assistance, etc.    Prior Function           Comments: per son (POA) apparently she is generally able to do well with minimal supervision, they even recently discussed giving her car keys back, and she manages in the home w/o AD and can do some ADLs.  He states aides likely have been doing more for her recently.     Hand Dominance         Extremity/Trunk Assessment   Upper Extremity Assessment Upper Extremity Assessment: Generalized weakness;Difficult to assess due to impaired cognition (appears equal bilaterally, grossly functional)    Lower Extremity Assessment Lower Extremity Assessment: Generalized weakness;Difficult to assess due to impaired cognition (appears = b/l, though she struggled to truly follow through with requested movement/testing)       Communication   Communication:  (very soft spoken, often does not respond to questioning with flat affect)  Cognition Arousal/Alertness: Awake/alert Behavior During Therapy: Flat affect Overall Cognitive Status: Impaired/Different from baseline                                 General Comments: pt with some baseline vascular dementia, per conversation with son she is far from her baseline      General Comments General comments (skin integrity, edema, etc.): Pt pleasant but with flat affect, poor awareness and by all accounts is far from her baseline    Exercises     Assessment/Plan    PT Assessment Patient needs continued PT services  PT Problem List Decreased activity tolerance;Decreased balance;Decreased mobility;Decreased cognition;Decreased knowledge of use of DME;Decreased safety awareness       PT Treatment Interventions DME instruction;Gait training;Stair training;Functional mobility training;Therapeutic activities;Therapeutic exercise;Balance training;Neuromuscular re-education;Cognitive remediation;Patient/family education    PT Goals (Current goals can be found in the Care Plan section)  Acute Rehab PT Goals Patient Stated Goal: son hopes pt's mentation improves enough to go home, but aggrees with rehab recs at this point PT Goal Formulation: With family Time For Goal Achievement: 07/22/21 Potential to Achieve Goals: Fair    Frequency Min 2X/week   Barriers to discharge        Co-evaluation               AM-PAC  PT "6 Clicks" Mobility  Outcome Measure Help needed turning from your back to your side while in a flat bed without using bedrails?: None Help needed moving from lying on your back to sitting on the side of a flat bed without using bedrails?: A Little Help needed moving to and from a bed to a chair (including a wheelchair)?: A Lot Help needed standing up from a chair using your arms (e.g., wheelchair or bedside chair)?: A Little Help needed to walk in hospital room?: A Lot Help needed climbing 3-5 steps with a railing? : Total 6 Click Score: 15    End of Session Equipment Utilized During Treatment: Gait belt Activity Tolerance: Patient tolerated treatment well Patient left: with chair alarm set;with call bell/phone within reach Nurse Communication: Mobility status PT Visit Diagnosis: Muscle weakness (generalized) (M62.81);Difficulty in walking, not elsewhere classified (R26.2);Unsteadiness on feet (R26.81)    Time: 6967-8938 PT Time Calculation (min) (ACUTE ONLY): 33 min   Charges:   PT Evaluation $PT Eval Low Complexity: 1 Low PT Treatments $Gait Training: 8-22 mins  Malachi Pro, DPT 07/08/2021, 4:03 PM

## 2021-07-08 NOTE — Progress Notes (Signed)
PROGRESS NOTE  JULIETT EASTBURN SJG:[id number] DOB: April 16, 1927 DOA: 07/06/2021 PCP: Gracelyn Nurse, MD  Brief History    Nicole Ramsey is a 87 y.o. female with medical history significant for vascular dementia, hypertension, type 2 diabetes, hypothyroidism, GERD, anxiety depression who was brought in by family today for concerns of altered mental status.   No family at bedside.  Patient alert and oriented to self and place only.  She cannot recall who brought her in and did not want to be hospitalized.  She reports having abdominal pain and points to her left lower quadrant but unsure for how long.  States she is constipated and cannot remember her last bowel movement.  Denies nausea or vomiting.  States she does not have good appetite.  I spoke with son over the phone and patient lives alone but has a home health aide that comes about 3 times a week and she also has other family members that bring her food.  He reported to ED physician earlier that she noticed some pills laying around is not sure if she is had some confusion with her medication.  She was brought into her PCP today who also noticed some changes in her mentation and advised her to be brought into the ED.   She was recently evaluated in the ED on 9/27 for lower abdominal pain but had negative work-up including CT abdomen and pelvis.  She was discharged with stool softening regimen.   ED Course: She was hypertensive up to systolic of 220/88 and was given IV metoprolol which temporarily improved her blood pressure but later had to be placed on Cardene infusion.  She was then later weaned off after her blood pressure improved significantly.   Her CBC was unremarkable for leukocytosis or anemia.  Sodium of 125, potassium 3.7, CBG of 128, creatinine of 0.92 from prior of 0.56.  UA was negative. She had CT head and abdominal brain that was negative for any acute findings although there was some motion artifact.  Code Stroke called on  this patient early this afternoon. Dr. Wilford Corner and I attended. Stat CT head without contrast was ordered. Results pending. The patient was discussed with her sister, Hilda Lias. It seems that the patient's mental status changes that prompted the code stroke are not unusual for this patient with hyponatremia and uncontrolled hypertension.   Neurology has evaluated CT head and MRI. No acute stroke there. Mental status changes likely due to hypertensive urgency, hypotensive, hypothyroidism.  Consultants  Neurology  Procedures  None  Antibiotics   Anti-infectives (From admission, onward)    None      Subjective  The patient is awake and alert. She is verbally and appropriately responsive, but very soft spoken. No new complaints.   Objective   Vitals:  Vitals:   07/08/21 1203 07/08/21 1627  BP: (!) 134/56 (!) 153/63  Pulse: 66 62  Resp: 17 17  Temp: 97.8 F (36.6 C) 97.6 F (36.4 C)  SpO2: 98% 97%    Exam:  Constitutional:  Awake, alert and oriented x 3. No acute distress. Respiratory:  CTA bilaterally, no w/r/r.  Respiratory effort normal. No retractions or accessory muscle use Cardiovascular:  RRR, no m/r/g No LE extremity edema   Normal pedal pulses Abdomen:  Abdomen appears normal; no tenderness or masses No hernias No HSM Musculoskeletal:  Digits/nails BUE: no clubbing, cyanosis, petechiae, infection exam of joints, bones, muscles of at least one of following: head/neck, RUE, LUE, RLE, LLE  Skin:  No rashes, lesions, ulcers palpation of skin: no induration or nodules Neurologic:  CN 2-12 intact Sensation all 4 extremities intact Psychiatric:  Mental status - somewhat confused. Waxing and waning according to her sister. Orientation to person  I have personally reviewed the following:   Today's Data  Vitals  Lab Data  CBC, BMP  Micro Data  COVID negative  Imaging  CT head Repeat CT head - pending  Cardiology Data  EKG  Scheduled Meds:   amLODipine  10 mg Oral Daily   atenolol  50 mg Oral BID   citalopram  10 mg Oral Daily   docusate sodium  100 mg Oral QHS   enoxaparin (LOVENOX) injection  40 mg Subcutaneous Q24H   feeding supplement  237 mL Oral BID BM   hydrALAZINE  25 mg Oral BID   levothyroxine  50 mcg Oral Q0600   pantoprazole  40 mg Oral BID   polyethylene glycol  17 g Oral Daily   QUEtiapine  25 mg Oral QHS   Continuous Infusions:  Principal Problem:   AMS (altered mental status) Active Problems:   Hypothyroid   Hypertensive urgency   Type 2 diabetes mellitus (HCC)   Vascular dementia (HCC)   Anxiety   Depression   Hyponatremia   AKI (acute kidney injury) (HCC)   LOS: 1 day   A & P  Hypertensive urgency - Presented with systolic of 220 initially requiring Cardene infusion but has been weaned off - Will resume home amlodipine, atenolol and hydralazine - PRN labetalol for systolic greater than 180 and/or diastolic greater than 110 - blood pressure still a little high, but much improved on amlodipine, atenolol, hydralazine. Will increase frequency of hydralazine to 25 mg tid.   Hyponatremia -Na of 126. Sodium has improved to 131. Continue fluid restriction. The patient's sister did note that prior to admission the patient had been not eating much, but had been drinking a lot. Monitor.    AKI -Creatinine of 0.92 up form 0.56 on admission. Now improved to 0.69.  -Monitor creatinine, electrolytes, and volume status -Avoid nephrotoxic agents and hypotension.   Altered mental status Likely encephalopathy from hypertensive urgency, hyponatremia and worsening waxing and waning of her vascular dementia -Treatment as above for hyponatremia and hypertension -Check TSH given history of hypothyroidism.  Also check vitamin B12 with history of B12 deficiency   Type 2 diabetes - No hyperglycemia on admit.  FSBS have been within range for the last 24 hours.   Hypothyroidism -continue levothyroxine. TSH  12.809. FT3 slightly low at 1.8 and  FT4 normal at 0.90. Likely euthyroid sick syndrome. Thyroid panel should be rechecked in 6 weeks.   Anxiety/depression - Continue Celexa   Vascular dementia - Patient currently alert and oriented only to self  -continue Seroquel  I have seen and examined this patient myself. I have spent 34 minutes in her evaluation and care.   DVT prophylaxis:.Lovenox Code Status: Full Family Communication: None available Disposition Plan: SNF. Consults called: Neurology Admission status: Inpatient   Level of care: Progressive Cardiac   Dispo: The patient is from: Home              Anticipated d/c is to: SNF              Patient currently is not medically stable to d/c.              Difficult to place patient No  Delta Pichon, DO [facility] Direct contact:  see [email or web address]  7PM-7AM contact night coverage as above 07/08/2021, 4:43 PM  LOS: 0 days

## 2021-07-08 NOTE — Plan of Care (Signed)
  Problem: Activity: Goal: Risk for activity intolerance will decrease Outcome: Progressing   Problem: Activity: Goal: Risk for activity intolerance will decrease Outcome: Progressing   

## 2021-07-08 NOTE — Progress Notes (Signed)
Neurology Progress Note   S:// Seen and examined.  No acute issues.    O:// Current vital signs: BP (!) 134/56 (BP Location: Right Arm)   Pulse 66   Temp 97.8 F (36.6 C)   Resp 17   Ht 5\' 7"  (1.702 m)   Wt 67.1 kg   SpO2 98%   BMI 23.17 kg/m  Vital signs in last 24 hours: Temp:  [97.7 F (36.5 C)-98.4 F (36.9 C)] 97.8 F (36.6 C) (10/09 1203) Pulse Rate:  [66-76] 66 (10/09 1203) Resp:  [17-21] 17 (10/09 1203) BP: (134-186)/(56-75) 134/56 (10/09 1203) SpO2:  [96 %-100 %] 98 % (10/09 1203) Weight:  [67.1 kg-67.5 kg] 67.1 kg (10/09 0447) Neurological exam Awake alert oriented to self, was able to tell me correct age, fact that she is in the hospital, but could not tell me the correct month. Naming intact Repetition intact Comprehension intact Cranial nerves II to XII intact Motor examination with antigravity strength in all fours Sensation intact light touch Coordination with no dysmetria   Medications  Current Facility-Administered Medications:    amLODipine (NORVASC) tablet 10 mg, 10 mg, Oral, Daily, Tu, Ching T, DO, 10 mg at 07/08/21 0808   atenolol (TENORMIN) tablet 50 mg, 50 mg, Oral, BID, Tu, Ching T, DO, 50 mg at 07/08/21 0808   citalopram (CELEXA) tablet 10 mg, 10 mg, Oral, Daily, Tu, Ching T, DO, 10 mg at 07/08/21 0807   docusate sodium (COLACE) capsule 100 mg, 100 mg, Oral, QHS, Tu, Ching T, DO, 100 mg at 07/07/21 2157   enoxaparin (LOVENOX) injection 40 mg, 40 mg, Subcutaneous, Q24H, Tu, Ching T, DO, 40 mg at 07/08/21 0808   feeding supplement (ENSURE ENLIVE / ENSURE PLUS) liquid 237 mL, 237 mL, Oral, BID BM, Tu, Ching T, DO, 237 mL at 07/08/21 1209   hydrALAZINE (APRESOLINE) injection 10 mg, 10 mg, Intravenous, Q6H PRN, 1210, NP, 10 mg at 07/07/21 1110   hydrALAZINE (APRESOLINE) tablet 25 mg, 25 mg, Oral, BID, Tu, Ching T, DO, 25 mg at 07/08/21 0808   labetalol (NORMODYNE) injection 5 mg, 5 mg, Intravenous, Q6H PRN, Tu, Ching T, DO, 5 mg at  07/07/21 0205   levothyroxine (SYNTHROID) tablet 50 mcg, 50 mcg, Oral, Q0600, Tu, Ching T, DO, 50 mcg at 07/08/21 0652   ondansetron (ZOFRAN) injection 4 mg, 4 mg, Intravenous, Q6H PRN, Swayze, Ava, DO, 4 mg at 07/07/21 1310   pantoprazole (PROTONIX) EC tablet 40 mg, 40 mg, Oral, Daily, Tu, Ching T, DO, 40 mg at 07/08/21 0808   polyethylene glycol (MIRALAX / GLYCOLAX) packet 17 g, 17 g, Oral, Daily, Tu, Ching T, DO, 17 g at 07/08/21 0807   QUEtiapine (SEROQUEL) tablet 25 mg, 25 mg, Oral, QHS, Tu, Ching T, DO, 25 mg at 07/07/21 2157 Labs CBC    Component Value Date/Time   WBC 7.6 07/08/2021 0512   RBC 4.38 07/08/2021 0512   HGB 13.0 07/08/2021 0512   HCT 38.5 07/08/2021 0512   PLT 219 07/08/2021 0512   MCV 87.9 07/08/2021 0512   MCH 29.7 07/08/2021 0512   MCHC 33.8 07/08/2021 0512   RDW 15.0 07/08/2021 0512   LYMPHSABS 1.4 07/08/2021 0512   MONOABS 0.8 07/08/2021 0512   EOSABS 0.0 07/08/2021 0512   BASOSABS 0.0 07/08/2021 0512    CMP     Component Value Date/Time   NA 131 (L) 07/08/2021 0512   K 4.2 07/08/2021 0512   CL 97 (L) 07/08/2021 0512   CO2 28  07/08/2021 0512   GLUCOSE 117 (H) 07/08/2021 0512   BUN 11 07/08/2021 0512   CREATININE 0.69 07/08/2021 0512   CALCIUM 9.5 07/08/2021 0512   PROT 8.0 07/06/2021 1805   ALBUMIN 4.1 07/06/2021 1805   AST 16 07/06/2021 1805   ALT 12 07/06/2021 1805   ALKPHOS 54 07/06/2021 1805   BILITOT 0.9 07/06/2021 1805   GFRNONAA >60 07/08/2021 0512   GFRAA >60 12/03/2018 1211  Ammonia less than 10 Vitamin B12-658.  Imaging I have reviewed images in epic and the results pertinent to this consultation are: CT head x2 negative for acute process MRI brain negative for stroke.  Chronic white matter changes.  Chest x-ray and abdominal x-ray with no acute changes.  Assessment: 87 year old woman with altered mental status-likely secondary to hypertensive urgency, hyponatremia and hypothyroidism along with pre-existing history of vascular  dementia. Appeared to have altered mental status, vomiting and some left-sided weakness/listing to the left yesterday in the setting of acute nausea and vomiting. Neurological exam stable and nonconcerning over the last 2 days  Recommendations: Management of medical issues per primary team as you are. No further inpatient neurological work-up needed at this time Please call with questions as needed.  -- Milon Dikes, MD Neurologist [facility] Pager: [phone]

## 2021-07-09 ENCOUNTER — Encounter: Payer: Self-pay | Admitting: Internal Medicine

## 2021-07-09 DIAGNOSIS — N179 Acute kidney failure, unspecified: Secondary | ICD-10-CM | POA: Diagnosis not present

## 2021-07-09 DIAGNOSIS — I160 Hypertensive urgency: Secondary | ICD-10-CM | POA: Diagnosis not present

## 2021-07-09 DIAGNOSIS — R410 Disorientation, unspecified: Secondary | ICD-10-CM | POA: Diagnosis not present

## 2021-07-09 DIAGNOSIS — F32A Depression, unspecified: Secondary | ICD-10-CM | POA: Diagnosis not present

## 2021-07-09 LAB — BASIC METABOLIC PANEL
Anion gap: 6 (ref 5–15)
BUN: 16 mg/dL (ref 8–23)
CO2: 28 mmol/L (ref 22–32)
Calcium: 9.5 mg/dL (ref 8.9–10.3)
Chloride: 93 mmol/L — ABNORMAL LOW (ref 98–111)
Creatinine, Ser: 1.11 mg/dL — ABNORMAL HIGH (ref 0.44–1.00)
GFR, Estimated: 47 mL/min — ABNORMAL LOW (ref >60)
Glucose, Bld: 142 mg/dL — ABNORMAL HIGH (ref 70–99)
Potassium: 4.1 mmol/L (ref 3.5–5.1)
Sodium: 127 mmol/L — ABNORMAL LOW (ref 135–145)

## 2021-07-09 NOTE — TOC Initial Note (Signed)
Transition of Care [facility]) - Initial/Assessment Note    Patient Details  Name: Nicole Ramsey MRN: [id number] Date of Birth: 09/09/1927  [facility]) CM/SW Contact:    Gildardo Griffes, LCSW Phone Number: 07/09/2021, 1:38 PM  Clinical Narrative:                  CSW spoke with patient's sons regarding SNF recs from PT And OT, son Nicole Ramsey is decision maker/point of contact. Reports patient received great care at [facility] earlier this year so that is preference.   CSW has made referral to [facility] and will start insurance auth.   Expected Discharge Plan: Skilled Nursing Facility Barriers to Discharge: Insurance Authorization   Patient Goals and CMS Choice Patient states their goals for this hospitalization and ongoing recovery are:: to go home CMS Medicare.gov Compare Post Acute Care list provided to:: Patient Represenative (must comment) (son) Choice offered to / list presented to : Adult Children  Expected Discharge Plan and Services Expected Discharge Plan: Skilled Nursing Facility     Post Acute Care Choice: Skilled Nursing Facility Living arrangements for the past 2 months: Single Family Home                                      Prior Living Arrangements/Services Living arrangements for the past 2 months: Single Family Home Lives with:: Self, Friends, Relatives Patient language and need for interpreter reviewed:: Yes Do you feel safe going back to the place where you live?: Yes      Need for Family Participation in Patient Care: Yes (Comment) Care giver support system in place?: Yes (comment)   Criminal Activity/Legal Involvement Pertinent to Current Situation/Hospitalization: No - Comment as needed  Activities of Daily Living Home Assistive Devices/Equipment: None ADL Screening (condition at time of admission) Patient's cognitive ability adequate to safely complete daily activities?: No Is the patient deaf or have difficulty  hearing?: No Does the patient have difficulty seeing, even when wearing glasses/contacts?: No Does the patient have difficulty concentrating, remembering, or making decisions?: Yes Patient able to express need for assistance with ADLs?: Yes Does the patient have difficulty dressing or bathing?: No Independently performs ADLs?: No Does the patient have difficulty walking or climbing stairs?: Yes Weakness of Legs: Both Weakness of Arms/Hands: Both  Permission Sought/Granted Permission sought to share information with : Case Manager, Magazine features editor, Family Supports    Share Information with NAME: Nicole Ramsey  Permission granted to share info w AGENCY: SNFs  Permission granted to share info w Relationship: son     Emotional Assessment         Alcohol / Substance Use: Not Applicable    Admission diagnosis:  Hyponatremia [E87.1] Vomiting [R11.10] Altered mental status, unspecified altered mental status type [R41.82] Hypertension, unspecified type [I10] AMS (altered mental status) [R41.82] Patient Active Problem List   Diagnosis Date Noted   Hypertensive urgency 07/07/2021   Type 2 diabetes mellitus (HCC) 07/07/2021   Vascular dementia (HCC) 07/07/2021   Anxiety 07/07/2021   Depression 07/07/2021   Hyponatremia 07/07/2021   AKI (acute kidney injury) (HCC) 07/07/2021   AMS (altered mental status) 07/06/2021   Failure to thrive in adult 11/06/2020   Protein-calorie malnutrition, severe 11/03/2020   Syncope 10/30/2020   Depression, major, recurrent, moderate (HCC) 10/27/2020   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 [place] [address] Phone: [phone] Fax: [phone]     Social Determinants of Health (SDOH) Interventions    Readmission  Risk Interventions No flowsheet data found.

## 2021-07-09 NOTE — Progress Notes (Signed)
PROGRESS NOTE  BRITTEN PARADY IRC:[id number] DOB: 1927-02-15 DOA: 07/06/2021 PCP: Gracelyn Nurse, MD  Brief History    Nicole Ramsey is a 87 y.o. female with medical history significant for vascular dementia, hypertension, type 2 diabetes, hypothyroidism, GERD, anxiety depression who was brought in by family today for concerns of altered mental status.   No family at bedside.  Patient alert and oriented to self and place only.  She cannot recall who brought her in and did not want to be hospitalized.  She reports having abdominal pain and points to her left lower quadrant but unsure for how long.  States she is constipated and cannot remember her last bowel movement.  Denies nausea or vomiting.  States she does not have good appetite.  I spoke with son over the phone and patient lives alone but has a home health aide that comes about 3 times a week and she also has other family members that bring her food.  He reported to ED physician earlier that she noticed some pills laying around is not sure if she is had some confusion with her medication.  She was brought into her PCP today who also noticed some changes in her mentation and advised her to be brought into the ED.   She was recently evaluated in the ED on 9/27 for lower abdominal pain but had negative work-up including CT abdomen and pelvis.  She was discharged with stool softening regimen.   ED Course: She was hypertensive up to systolic of 220/88 and was given IV metoprolol which temporarily improved her blood pressure but later had to be placed on Cardene infusion.  She was then later weaned off after her blood pressure improved significantly.   Her CBC was unremarkable for leukocytosis or anemia.  Sodium of 125, potassium 3.7, CBG of 128, creatinine of 0.92 from prior of 0.56.  UA was negative. She had CT head and abdominal brain that was negative for any acute findings although there was some motion artifact.  Code Stroke called on  this patient early this afternoon. Dr. Wilford Corner and I attended. Stat CT head without contrast was ordered. Results pending. The patient was discussed with her sister, Hilda Lias. It seems that the patient's mental status changes that prompted the code stroke are not unusual for this patient with hyponatremia and uncontrolled hypertension.   Neurology has evaluated CT head and MRI. No acute stroke there. Mental status changes likely due to hypertensive urgency, hypotensive, hypothyroidism.  Consultants  Neurology  Procedures  None  Antibiotics   Anti-infectives (From admission, onward)    None      Subjective  The patient is awake and alert. She is verbally and appropriately responsive, but very soft spoken. No new complaints.   Objective   Vitals:  Vitals:   07/09/21 1111 07/09/21 1512  BP: (!) 130/57 (!) 128/55  Pulse: 65 61  Resp: 17 16  Temp: 98.1 F (36.7 C) 97.8 F (36.6 C)  SpO2: 100% 99%    Exam:  Constitutional:  Awake, alert and oriented x 3. No acute distress. Respiratory:  CTA bilaterally, no w/r/r.  Respiratory effort normal. No retractions or accessory muscle use Cardiovascular:  RRR, no m/r/g No LE extremity edema   Normal pedal pulses Abdomen:  Abdomen appears normal; no tenderness or masses No hernias No HSM Musculoskeletal:  Digits/nails BUE: no clubbing, cyanosis, petechiae, infection exam of joints, bones, muscles of at least one of following: head/neck, RUE, LUE, RLE, LLE  Skin:  No rashes, lesions, ulcers palpation of skin: no induration or nodules Neurologic:  CN 2-12 intact Sensation all 4 extremities intact Psychiatric:  Mental status - somewhat confused. Waxing and waning according to her sister. Orientation to person  I have personally reviewed the following:   Today's Data  Vitals  Lab Data  CBC, BMP  Micro Data  COVID negative  Imaging  CT head Repeat CT head - pending  Cardiology Data  EKG  Scheduled Meds:   amLODipine  10 mg Oral Daily   atenolol  50 mg Oral BID   citalopram  10 mg Oral Daily   docusate sodium  100 mg Oral QHS   enoxaparin (LOVENOX) injection  40 mg Subcutaneous Q24H   feeding supplement  237 mL Oral BID BM   hydrALAZINE  25 mg Oral Q8H   levothyroxine  50 mcg Oral Q0600   pantoprazole  40 mg Oral BID   polyethylene glycol  17 g Oral Daily   QUEtiapine  25 mg Oral QHS   Continuous Infusions:  Principal Problem:   AMS (altered mental status) Active Problems:   Hypothyroid   Hypertensive urgency   Type 2 diabetes mellitus (HCC)   Vascular dementia (HCC)   Anxiety   Depression   Hyponatremia   AKI (acute kidney injury) (HCC)   LOS: 2 days   A & P  Hypertensive urgency - Presented with systolic of 220 initially requiring Cardene infusion but has been weaned off - Will resume home amlodipine, atenolol and hydralazine - PRN labetalol for systolic greater than 180 and/or diastolic greater than 110 - Blood pressure improved on amlodipine, atenolol, hydralazine.    Hyponatremia -Na of 126. Sodium has improved to 131 on 07/09/2019. Back down to 217 today. Will relax fluid restriction. The patient's sister did note that prior to admission the patient had been not eating much, but had been drinking a lot. Monitor.    AKI -Creatinine of 1.11 up form 0.56 on admission.  -Monitor creatinine, electrolytes, and volume status -Avoid nephrotoxic agents and hypotension. Will relax fluid restriction.   Altered mental status Likely encephalopathy from hypertensive urgency, hyponatremia and worsening waxing and waning of her vascular dementia -Treatment as above for hyponatremia and hypertension -Check TSH given history of hypothyroidism.  Also check vitamin B12 with history of B12 deficiency   Type 2 diabetes - No hyperglycemia on admit.  FSBS have been within range for the last 24 hours.   Hypothyroidism -continue levothyroxine. TSH 12.809. FT3 slightly low at 1.8 and  FT4  normal at 0.90. Likely euthyroid sick syndrome. Thyroid panel should be rechecked in 6 weeks.   Anxiety/depression - Continue Celexa   Vascular dementia - Patient currently alert and oriented only to self  -continue Seroquel  I have seen and examined this patient myself. I have spent 34 minutes in her evaluation and care.   DVT prophylaxis:.Lovenox Code Status: Full Family Communication: None available Disposition Plan: SNF. Consults called: Neurology Admission status: Inpatient   Level of care: Progressive Cardiac   Dispo: The patient is from: Home              Anticipated d/c is to: SNF              Patient currently is not medically stable to d/c.              Difficult to place patient No  Anida Deol, DO [facility] Direct contact: see [email or web address]  7PM-7AM contact night  coverage as above 07/09/2021, 4:28 PM  LOS: 0 days

## 2021-07-09 NOTE — NC FL2 (Addendum)
MEDICAID FL2 LEVEL OF CARE SCREENING TOOL     IDENTIFICATION  Patient Name: Nicole Ramsey Birthdate: 18-Feb-1927 Sex: female Admission Date (Current Location): 07/06/2021  [facility] and [place] Number:  Chiropodist and Address:  [facility], [address], [place], [place] [address]      Provider Number: [id number]  Attending Physician Name and Address:  Fran Lowes, DO  Relative Name and Phone Number:  Adria Devon (son) [phone]    Current Level of Care: Hospital Recommended Level of Care: Skilled Nursing Facility Prior Approval Number:    Date Approved/Denied:   PASRR Number:   [id number]  Discharge Plan: SNF    Current Diagnoses: Patient Active Problem List   Diagnosis Date Noted   Hypertensive urgency 07/07/2021   Type 2 diabetes mellitus (HCC) 07/07/2021   Vascular dementia (HCC) 07/07/2021   Anxiety 07/07/2021   Depression 07/07/2021   Hyponatremia 07/07/2021   AKI (acute kidney injury) (HCC) 07/07/2021   AMS (altered mental status) 07/06/2021   Failure to thrive in adult 11/06/2020   Protein-calorie malnutrition, severe 11/03/2020   Syncope 10/30/2020   Depression, major, recurrent, moderate (HCC) 10/27/2020   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 (HCC) 10/27/2020   Hypothyroid 10/27/2020   Hypertension 10/27/2020    Orientation RESPIRATION BLADDER Height & Weight     Self  Normal Incontinent, External catheter Weight: 147 lb 14.9 oz (67.1 kg) Height:  5\' 7"  (170.2 cm)  BEHAVIORAL SYMPTOMS/MOOD NEUROLOGICAL BOWEL NUTRITION STATUS      Incontinent Diet (see discharge summary)  AMBULATORY STATUS COMMUNICATION OF NEEDS Skin   Extensive Assist Verbally Normal                       Personal Care Assistance Level of Assistance  Bathing, Feeding, Dressing, Total care Bathing Assistance: Maximum assistance Feeding assistance: Limited assistance Dressing Assistance: Maximum  assistance Total Care Assistance: Maximum assistance   Functional Limitations Info  Sight, Hearing, Speech Sight Info: Impaired Hearing Info: Adequate Speech Info: Adequate    SPECIAL CARE FACTORS FREQUENCY  PT (By licensed PT), OT (By licensed OT)     PT Frequency: min 3x weekly OT Frequency: min 3x weekly            Contractures Contractures Info: Not present    Additional Factors Info  Code Status, Allergies Code Status Info: full Allergies Info: aspirin           Current Medications (07/09/2021):  This is the current hospital active medication list Current Facility-Administered Medications  Medication Dose Route Frequency Provider Last Rate Last Admin   amLODipine (NORVASC) tablet 10 mg  10 mg Oral Daily Tu, Ching T, DO   10 mg at 07/09/21 0831   atenolol (TENORMIN) tablet 50 mg  50 mg Oral BID Tu, Ching T, DO   50 mg at 07/09/21 0831   citalopram (CELEXA) tablet 10 mg  10 mg Oral Daily Tu, Ching T, DO   10 mg at 07/09/21 0831   docusate sodium (COLACE) capsule 100 mg  100 mg Oral QHS Tu, Ching T, DO   100 mg at 07/08/21 2151   enoxaparin (LOVENOX) injection 40 mg  40 mg Subcutaneous Q24H Tu, Ching T, DO   40 mg at 07/09/21 0831   feeding supplement (ENSURE ENLIVE / ENSURE PLUS) liquid 237 mL  237 mL Oral BID BM Tu, Ching T, DO   237 mL at 07/09/21 0833   hydrALAZINE (APRESOLINE) injection  10 mg  10 mg Intravenous Q6H PRN Manuela Schwartz, NP   10 mg at 07/07/21 1110   hydrALAZINE (APRESOLINE) tablet 25 mg  25 mg Oral Q8H Swayze, Ava, DO   25 mg at 07/08/21 2151   labetalol (NORMODYNE) injection 5 mg  5 mg Intravenous Q6H PRN Tu, Ching T, DO   5 mg at 07/07/21 0205   levothyroxine (SYNTHROID) tablet 50 mcg  50 mcg Oral Q0600 Tu, Ching T, DO   50 mcg at 07/08/21 0652   ondansetron (ZOFRAN) injection 4 mg  4 mg Intravenous Q6H PRN Swayze, Ava, DO   4 mg at 07/07/21 1310   pantoprazole (PROTONIX) EC tablet 40 mg  40 mg Oral BID Swayze, Ava, DO   40 mg at 07/09/21 0831    polyethylene glycol (MIRALAX / GLYCOLAX) packet 17 g  17 g Oral Daily Tu, Ching T, DO   17 g at 07/09/21 0831   QUEtiapine (SEROQUEL) tablet 25 mg  25 mg Oral QHS Tu, Ching T, DO   25 mg at 07/08/21 2151     Discharge Medications: Please see discharge summary for a list of discharge medications.  Relevant Imaging Results:  Relevant Lab Results:   Additional Information SSN: [id number]  Gildardo Griffes, LCSW

## 2021-07-09 NOTE — Evaluation (Signed)
Occupational Therapy Evaluation Patient Details Name: Nicole Ramsey MRN: [id number] DOB: 15-Oct-1926 Today's Date: 07/09/2021   History of Present Illness 87 y.o. female with medical history significant for vascular dementia, hypertension, type 2 diabetes, hypothyroidism, GERD, anxiety depression who was brought in by family for concerns of altered mental status.   Clinical Impression   Pt seen for OT evaluation this date. Upon arrival to room, pt awake and sitting upright in bed. Pt A&Ox2, minimally responding to y/n questions, however reporting that she lives alone and does not use AD for functional mobility at baseline. Per chart review, pt is independent with ADLs and functional mobility at baseline. Pt has personal care aides that assist 2x/wk for 2-3 hours and weekly RN visits. Pt's family/friends assist with IADLs. Pt currently presents with impaired cognition, decreased strength, and decreased activity tolerance. Due to these functional impairments, pt requires SUPERVISION for bed mobility, MIN A for functional mobility of short household distances (~70ft) with RW, and SUPERVISION-MIN A for seated UB ADLs. Pt attempted to perform standing grooming tasks this date, however unable to maintain static standing balance for >30sec to do so d/t decreased activity tolerance. Pt would benefit from additional skilled OT services to maximize return to PLOF and minimize risk of future falls, injury, caregiver burden, and readmission. Upon discharge, recommend SNF.       Recommendations for follow up therapy are one component of a multi-disciplinary discharge planning process, led by the attending physician.  Recommendations may be updated based on patient status, additional functional criteria and insurance authorization.   Follow Up Recommendations  SNF    Equipment Recommendations  Other (comment) (defer to next venue of care)       Precautions / Restrictions Precautions Precautions:  Fall Restrictions Weight Bearing Restrictions: No      Mobility Bed Mobility Overal bed mobility: Needs Assistance Bed Mobility: Supine to Sit     Supine to sit: Supervision     General bed mobility comments: With HOB elevated, pt able to perform without physical assist. Requires verbal cues to initiate transfer    Transfers Overall transfer level: Needs assistance Equipment used: Rolling walker (2 wheeled) Transfers: Sit to/from Stand Sit to Stand: Min assist         General transfer comment: MIN A via 1-person HHA to steady upon standing    Balance Overall balance assessment: Needs assistance Sitting-balance support: No upper extremity supported;Feet supported Sitting balance-Leahy Scale: Good Sitting balance - Comments: Good balance sitting EOB during seated UB dressing   Standing balance support: Bilateral upper extremity supported;During functional activity Standing balance-Leahy Scale: Poor Standing balance comment: Requires MIN A and b/l UE support to walk ~35ft before reporting fatigue                           ADL either performed or assessed with clinical judgement   ADL Overall ADL's : Needs assistance/impaired     Grooming: Wash/dry face;Supervision/safety;Set up;Oral care;Minimal assistance;Sitting Grooming Details (indicate cue type and reason): MIN A for thoroughness during seated oral care.         Upper Body Dressing : Supervision/safety;Set up;Sitting Upper Body Dressing Details (indicate cue type and reason): to don/doff hospital gown                 Functional mobility during ADLs: Minimal assistance;Rolling walker (to walk ~67ft)       Vision Patient Visual Report: No change from baseline  Pertinent Vitals/Pain Pain Assessment: No/denies pain        Extremity/Trunk Assessment Upper Extremity Assessment Upper Extremity Assessment: Generalized weakness;Difficult to assess due to impaired cognition    Lower Extremity Assessment Lower Extremity Assessment: Generalized weakness;Difficult to assess due to impaired cognition       Communication Communication Communication: Other (comment) (very soft spoken, often does not respond to questioning with flat affect)   Cognition Arousal/Alertness: Awake/alert Behavior During Therapy: Flat affect Overall Cognitive Status: Impaired/Different from baseline                                 General Comments: Pt A&Ox2. Has hx of dementia however family reporting cognition is not at baseline. Responds minimally to Y/N questions. Pt requires verbal cues for sequencing   General Comments  Pt with flat affect, however reporting some fatigue/dizziness duringstanding. Vitals obtained once seated in recliner: BP 123/56, HR 63, SpO2 100%; RN & attending made aware            Home Living Family/patient expects to be discharged to:: Unsure Living Arrangements: Alone Available Help at Discharge: Family;Personal care attendant;Available PRN/intermittently (family assists with IADLs. Has 2-3 hours of aides 2x/wk and weekly RN visit) Type of Home: House Home Access: Stairs to enter [employer] of Steps: 2-3 steps Entrance Stairs-Rails: Right;Left Home Layout: One level     Bathroom Shower/Tub: Other (comment) (pt reports typically spongebathing sink-side)   Bathroom Toilet: Standard     Home Equipment: Cane - single point   Additional Comments: Pt minimally responding to y/n questions, however reporting that she lives alone and does not use AD for functional mobility. PLOF and home set-up obtained from previous enounter (Feb 2022) and chart review.      Prior Functioning/Environment Level of Independence: Needs assistance  Gait / Transfers Assistance Needed: Per chart review, pt does not use RW for functional mobility. During previous hospitalization, pt reported using a SPC ADL's / Homemaking Assistance Needed: During  previous hospitalization, pt reported being independent with dressing and sponge-bathing at baseline. Has 2-3 hours of aides 2x/wk and weekly RN visit. Family/friends assist with IADLs            OT Problem List: Decreased strength;Decreased activity tolerance;Impaired balance (sitting and/or standing);Decreased cognition;Decreased safety awareness      OT Treatment/Interventions: Self-care/ADL training;Therapeutic exercise;Energy conservation;DME and/or AE instruction;Therapeutic activities;Patient/family education;Balance training    OT Goals(Current goals can be found in the care plan section) Acute Rehab OT Goals OT Goal Formulation: Patient unable to participate in goal setting Time For Goal Achievement: 07/23/21 ADL Goals Pt Will Perform Grooming: with set-up;with supervision;standing Pt Will Transfer to Toilet: with modified independence;ambulating;bedside commode Pt Will Perform Toileting - Clothing Manipulation and hygiene: with min guard assist;sit to/from stand  OT Frequency: Min 1X/week    AM-PAC OT "6 Clicks" Daily Activity     Outcome Measure Help from another person eating meals?: None Help from another person taking care of personal grooming?: A Little Help from another person toileting, which includes using toliet, bedpan, or urinal?: A Lot Help from another person bathing (including washing, rinsing, drying)?: A Lot Help from another person to put on and taking off regular upper body clothing?: A Little Help from another person to put on and taking off regular lower body clothing?: A Lot 6 Click Score: 16   End of Session Equipment Utilized During Treatment: Rolling walker Nurse Communication: Mobility status;Other (comment) (  vitals following functional mobility)  Activity Tolerance: Patient tolerated treatment well Patient left: in chair;with call bell/phone within reach;with chair alarm set;Other (comment) (with hospitalist)  OT Visit Diagnosis: Unsteadiness  on feet (R26.81);Muscle weakness (generalized) (M62.81);Other symptoms and signs involving cognitive function                Time: 1552-0802 OT Time Calculation (min): 26 min Charges:  OT General Charges $OT Visit: 1 Visit OT Evaluation $OT Eval Moderate Complexity: 1 Mod OT Treatments $Self Care/Home Management : 8-22 mins  Matthew Folks, OTR/L [facility] [phone]

## 2021-07-10 LAB — RESP PANEL BY RT-PCR (FLU A&B, COVID) ARPGX2
Influenza A by PCR: NEGATIVE
Influenza B by PCR: NEGATIVE
SARS Coronavirus 2 by RT PCR: NEGATIVE

## 2021-07-10 MED ORDER — POLYETHYLENE GLYCOL 3350 17 G PO PACK: 17.0000 g | PACK | Freq: Every day | ORAL | 0 refills | Status: AC

## 2021-07-10 MED ORDER — PANTOPRAZOLE SODIUM 40 MG PO TBEC: 40.0000 mg | DELAYED_RELEASE_TABLET | Freq: Two times a day (BID) | ORAL | 0 refills | Status: AC

## 2021-07-10 NOTE — TOC Transition Note (Signed)
Transition of Care [facility]) - CM/SW Discharge Note   Patient Details  Name: TEOFILA BOWERY MRN: [id number] Date of Birth: Sep 21, 1927  Transition of Care [facility]) CM/SW Contact:  Maree Krabbe, LCSW Phone Number: 07/10/2021, 2:05 PM   Clinical Narrative:   Clinical Social Worker facilitated patient discharge including contacting patient family and facility to confirm patient discharge plans.  Clinical information faxed to facility and family agreeable with plan.  CSW arranged ambulance transport via [facility] to [facility] (room 803) .  RN to call [phone] for report prior to discharge.   Final next level of care: Skilled Nursing Facility Barriers to Discharge: No Barriers Identified   Patient Goals and CMS Choice Patient states their goals for this hospitalization and ongoing recovery are:: to go home CMS Medicare.gov Compare Post Acute Care list provided to:: Patient Represenative (must comment) (son) Choice offered to / list presented to : Adult Children  Discharge Placement              Patient chooses bed at:  ([facility]) Patient to be transferred to facility by: [facility] Name of family member notified: Son, Ronnell Patient and family notified of of transfer: 07/10/21  Discharge Plan and Services     Post Acute Care Choice: Skilled Nursing Facility                               Social Determinants of Health (SDOH) Interventions     Readmission Risk Interventions No flowsheet data found.

## 2021-07-10 NOTE — Progress Notes (Signed)
Pt son has called multiple times to express that he does not agree with the patient being transported or discharged from the facility. I discussed with the son that she has been medically cleared and that I will reach out to the MD and SW to advise any questions he may have that I am unable to answer. He thus wanted me to give pt a xanax as she was being discharged, which I did not have an order to do so, and stated that she should have had them ordered and given throughout her entire stay. Pt was seemingly calm previously and has not expressed any c/o pain. She did display some agitation and hesitation to leaving after hearing her son via a visitor in the room.

## 2021-07-10 NOTE — Progress Notes (Signed)
Physical Therapy Treatment Patient Details Name: Nicole Ramsey MRN: [id number] DOB: 12-26-1926 Today's Date: 07/10/2021   History of Present Illness 87 y.o. female with medical history significant for vascular dementia, hypertension, type 2 diabetes, hypothyroidism, GERD, anxiety depression who was brought in by family for concerns of altered mental status.    PT Comments    Pt in bed, alert, quiet voice, but agreeable to treatment. Pt noted needing to utilize bathroom (Purwick in place w/ edu provided on tool.) Pt able to move to EOB with SUPV and stand w/ MIN-G, RW but Purwick failed and pt transferred to [facility]. PT provided clean up and pt able to perform pericare independently. Following episode pt denied out of room amb but agreeable to sit in char. No LOB or instability noted with amb, RW, MIN G and pt able to maneuver around obstacles in room without issue. SNF remains primary d/c recommendation to return to PLOF, increased endurance, and strength while maintaining safety with ADLs. Skilled PT intervention is indicated to address deficits in function, mobility, and to return to PLOF as able.     Recommendations for follow up therapy are one component of a multi-disciplinary discharge planning process, led by the attending physician.  Recommendations may be updated based on patient status, additional functional criteria and insurance authorization.  Follow Up Recommendations  SNF;Supervision/Assistance - 24 hour     Equipment Recommendations  Rolling walker with 5" wheels    Recommendations for Other Services       Precautions / Restrictions Precautions Precautions: Fall Restrictions Weight Bearing Restrictions: No     Mobility  Bed Mobility Overal bed mobility: Needs Assistance Bed Mobility: Supine to Sit     Supine to sit: HOB elevated;Supervision     General bed mobility comments: Supervision for cues, increased time of task    Transfers Overall transfer level:  Needs assistance Equipment used: Rolling walker (2 wheeled) Transfers: Sit to/from Stand Sit to Stand: Min guard         General transfer comment: STS x 2 w/ cues for hand placement on RW  Ambulation/Gait Ambulation/Gait assistance: Min guard Gait Distance (Feet): 30 Feet Assistive device: Rolling walker (2 wheeled)       General Gait Details: Heavy use of UE with pt stating she feels uncomfortable without RW (at baseline does not use ADs). No LOB or instability during amb, but pt denied further amb following episode of incontinence.   Stairs             Wheelchair Mobility    Modified Rankin (Stroke Patients Only)       Balance Overall balance assessment: Needs assistance Sitting-balance support: No upper extremity supported;Feet supported Sitting balance-Leahy Scale: Good     Standing balance support: Bilateral upper extremity supported;During functional activity Standing balance-Leahy Scale: Poor Standing balance comment: requires BUE support for dynamic tasks                            Cognition Arousal/Alertness: Awake/alert Behavior During Therapy: Flat affect (depressed) Overall Cognitive Status: Impaired/Different from baseline                                 General Comments: AO x 2, person, place; baseline hx of vascular dementia. Follows all one step commands consistently. Pt did state "please do leave me" end of session, listening and encouragment provided with pt in  better spirits.      Exercises Other Exercises Other Exercises: Bed > BSC, pt incontient upon initial STS. PT assisted with clean up and pt able to perform pericare independently wtih MIN G    General Comments        Pertinent Vitals/Pain Pain Assessment: Faces Faces Pain Scale: Hurts little more Pain Location: lower back Pain Descriptors / Indicators: Aching Pain Intervention(s): Limited activity within patient's tolerance;Monitored during  session;Repositioned    Home Living                      Prior Function            PT Goals (current goals can now be found in the care plan section) Progress towards PT goals: Progressing toward goals    Frequency    Min 2X/week      PT Plan Current plan remains appropriate    Co-evaluation              AM-PAC PT "6 Clicks" Mobility   Outcome Measure  Help needed turning from your back to your side while in a flat bed without using bedrails?: None Help needed moving from lying on your back to sitting on the side of a flat bed without using bedrails?: A Little Help needed moving to and from a bed to a chair (including a wheelchair)?: A Little Help needed standing up from a chair using your arms (e.g., wheelchair or bedside chair)?: A Little Help needed to walk in hospital room?: A Little Help needed climbing 3-5 steps with a railing? : A Lot 6 Click Score: 18    End of Session Equipment Utilized During Treatment: Gait belt Activity Tolerance: Patient tolerated treatment well Patient left: with chair alarm set;with call bell/phone within reach;in chair Nurse Communication: Mobility status PT Visit Diagnosis: Muscle weakness (generalized) (M62.81);Difficulty in walking, not elsewhere classified (R26.2);Unsteadiness on feet (R26.81)     Time: 8938-1017 PT Time Calculation (min) (ACUTE ONLY): 39 min  Charges:                       [employer], SPT

## 2021-07-10 NOTE — Discharge Summary (Signed)
Physician Discharge Summary  Nicole Ramsey DQQ:[id number] DOB: [birth year] DOA: 07/06/2021  PCP: Gracelyn Nurse, MD  Admit date: 07/06/2021 Discharge date: 07/10/2021  Recommendations for Outpatient Follow-up:  Discharge to SNF for PT/OT The patient should follow up with her PCP in 7-10 days. A chemistry should be drawn on that date and reported to PCP.  Discharge Diagnoses: Principal diagnosis is #1 Hypertensive urgency Hyponatremia AKI Altered mental status DM II Hypothyroidism Anxiety/Depression Vascular Dementia   Discharge Condition: Fair  Disposition: SNF  Diet recommendation: Heart Healthy/Modified Carbohydrates  Filed Weights   07/06/21 1551 07/07/21 1459 07/08/21 0447  Weight: 67 kg 67.5 kg 67.1 kg    History of present illness:  Nicole Ramsey is a 87 y.o. female with medical history significant for vascular dementia, hypertension, type 2 diabetes, hypothyroidism, GERD, anxiety depression who was brought in by family today for concerns of altered mental status.   No family at bedside.  Patient alert and oriented to self and place only.  She cannot recall who brought her in and did not want to be hospitalized.  She reports having abdominal pain and points to her left lower quadrant but unsure for how long.  States she is constipated and cannot remember her last bowel movement.  Denies nausea or vomiting.  States she does not have good appetite.  I spoke with son over the phone and patient lives alone but has a home health aide that comes about 3 times a week and she also has other family members that bring her food.  He reported to ED physician earlier that she noticed some pills laying around is not sure if she is had some confusion with her medication.  She was brought into her PCP today who also noticed some changes in her mentation and advised her to be brought into the ED.   She was recently evaluated in the ED on 9/27 for lower abdominal pain but had  negative work-up including CT abdomen and pelvis.  She was discharged with stool softening regimen.   ED Course: She was hypertensive up to systolic of 220/88 and was given IV metoprolol which temporarily improved her blood pressure but later had to be placed on Cardene infusion.  She was then later weaned off after her blood pressure improved significantly.   Her CBC was unremarkable for leukocytosis or anemia.  Sodium of 125, potassium 3.7, CBG of 128, creatinine of 0.92 from prior of 0.56.  UA was negative. She had CT head and abdominal brain that was negative for any acute findings although there was some motion artifact  Hospital Course:    Today's assessment: S: The patient is sitting up at bedside. No new complaints.  O: Vitals:  Vitals:   07/10/21 0412 07/10/21 0700  BP: (!) 158/70 (!) 158/60  Pulse: 64 64  Resp: 16 17  Temp: 99.1 F (37.3 C) 98.7 F (37.1 C)  SpO2: 98% 98%    Constitutional:  Appears calm and comfortable Respiratory:  CTA bilaterally, no w/r/r.  Respiratory effort normal. No retractions or accessory muscle use Cardiovascular:  RRR, no m/r/g No LE extremity edema   Normal pedal pulses Abdomen:  Abdomen appears normal; no tenderness or masses No hernias No HSM Musculoskeletal:  Digits/nails: no clubbing, cyanosis, petechiae, infection exam of joints, bones, muscles of at least one of following: head/neck, RUE, LUE, RLE, LLE   Skin:  No rashes, lesions, ulcers palpation of skin: no induration or nodules Neurologic:  CN 2-12 intact Sensation  all 4 extremities intact Psychiatric:  judgement and insight appear normal Mental status improved  I have seen and examined this patient myself. I have spent 32 minutes in her evaluation and care.   Discharge Instructions  Discharge Instructions     Activity as tolerated - No restrictions   Complete by: As directed    Call MD for:   Complete by: As directed    Neurological changes.   Call MD for:   difficulty breathing, headache or visual disturbances   Complete by: As directed    Diet - low sodium heart healthy   Complete by: As directed    Discharge instructions   Complete by: As directed    Discharge to SNF for PT/OT The patient should follow up with her PCP in 7-10 days. A chemistry should be drawn on that date and reported to PCP.   Increase activity slowly   Complete by: As directed       Allergies as of 07/10/2021       Reactions   Aspirin Other (See Comments)   Elevated blood pressure        Medication List     STOP taking these medications    ALPRAZolam 1 MG tablet Commonly known as: XANAX   ibuprofen 200 MG tablet Commonly known as: ADVIL   meloxicam 15 MG tablet Commonly known as: MOBIC   omeprazole 40 MG capsule Commonly known as: PRILOSEC Replaced by: pantoprazole 40 MG tablet       TAKE these medications    amLODipine 10 MG tablet Commonly known as: NORVASC Take 10 mg by mouth daily.   atenolol 50 MG tablet Commonly known as: TENORMIN Take 1 tablet (50 mg total) by mouth 2 (two) times daily.   citalopram 10 MG tablet Commonly known as: CELEXA Take 10 mg by mouth daily.   cyanocobalamin 1000 MCG/ML injection Commonly known as: (VITAMIN B-12) Inject 1,000 mcg into the muscle every 30 (thirty) days.   docusate sodium 100 MG capsule Commonly known as: Colace Take 1 capsule (100 mg total) by mouth at bedtime.   Dulcolax 5 MG EC tablet Generic drug: bisacodyl Take 1 tablet (5 mg total) by mouth daily as needed for moderate constipation.   feeding supplement Liqd Take 237 mLs by mouth 2 (two) times daily between meals.   hydrALAZINE 25 MG tablet Commonly known as: APRESOLINE Take 25 mg by mouth 2 (two) times daily.   levothyroxine 50 MCG tablet Commonly known as: SYNTHROID Take 50 mcg by mouth daily.   meclizine 25 MG tablet Commonly known as: ANTIVERT Take 25 mg by mouth 3 (three) times daily as needed for dizziness.    pantoprazole 40 MG tablet Commonly known as: PROTONIX Take 1 tablet (40 mg total) by mouth 2 (two) times daily. Replaces: omeprazole 40 MG capsule   polyethylene glycol 17 g packet Commonly known as: MIRALAX / GLYCOLAX Take 17 g by mouth daily. Start taking on: July 11, 2021   Probiotic 1-250 BILLION-MG Caps Take 1 capsule by mouth daily.   QUEtiapine 25 MG tablet Commonly known as: SEROQUEL Take 25 mg by mouth at bedtime.   Vitamin D (Ergocalciferol) 1.25 MG (50000 UNIT) Caps capsule Commonly known as: DRISDOL Take 50,000 Units by mouth once a week.       Allergies  Allergen Reactions   Aspirin Other (See Comments)    Elevated blood pressure    The results of significant diagnostics from this hospitalization (including imaging, microbiology, ancillary and laboratory)  are listed below for reference.    Significant Diagnostic Studies: DG Chest 2 View  Result Date: 07/07/2021 CLINICAL DATA:  Altered mental status. EXAM: CHEST - 2 VIEW COMPARISON:  October 27 2020 FINDINGS: The heart size and mediastinal contours are stable. The heart size is mildly enlarged. Both lungs are clear. The visualized skeletal structures are unremarkable. IMPRESSION: No active cardiopulmonary disease. Electronically Signed   By: Sherian Rein M.D.   On: 07/07/2021 14:07   CT HEAD WO CONTRAST  Result Date: 07/06/2021 CLINICAL DATA:  Mental status change EXAM: CT HEAD WITHOUT CONTRAST TECHNIQUE: Contiguous axial images were obtained from the base of the skull through the vertex without intravenous contrast. COMPARISON:  CT 10/27/2020 FINDINGS: Brain: No acute territorial infarction, hemorrhage or intracranial mass. Patchy white matter hypodensity most likely chronic small vessel ischemic change. Mild atrophy. Stable ventricle size. Vascular: No hyperdense vessels.  Carotid vascular calcification. Skull: Normal. Negative for fracture or focal lesion. Sinuses/Orbits: No acute finding. Other: None  IMPRESSION: 1. No CT evidence for acute intracranial abnormality. 2. Mild atrophy and chronic small vessel ischemic changes of white matter Electronically Signed   By: Jasmine Pang M.D.   On: 07/06/2021 16:35   MR BRAIN WO CONTRAST  Result Date: 07/06/2021 CLINICAL DATA:  Initial evaluation for altered mental status. EXAM: MRI HEAD WITHOUT CONTRAST TECHNIQUE: Multiplanar, multiecho pulse sequences of the brain and surrounding structures were obtained without intravenous contrast. COMPARISON:  Prior CT from earlier the same day. FINDINGS: Brain: Examination moderately to severely degraded by motion artifact. Generalized age-related cerebral atrophy. Patchy and confluent T2/FLAIR hyperintensity involving the periventricular and deep white matter both cerebral hemispheres, most likely related to chronic microvascular ischemic disease, moderately advanced in nature. Multiple scattered remote lacunar infarcts present about the bilateral basal ganglia, thalami, and pons. Patchy FLAIR signal abnormality at the ventral medulla also favored to reflect remote ischemic change. No abnormal foci of restricted diffusion to suggest acute or subacute ischemia. Gray-white matter differentiation otherwise maintained. No areas of remote cortical infarction. No definite evidence for acute or chronic intracranial hemorrhage on this motion degraded exam. No mass lesion, midline shift or mass effect. No hydrocephalus or extra-axial fluid collection. Pituitary gland grossly within normal limits. Vascular: Major intracranial vascular flow voids are grossly maintained at the skull base. Skull and upper cervical spine: Craniocervical junction within normal limits. Bone marrow signal intensity normal. No scalp soft tissue abnormality. Sinuses/Orbits: Prior ocular lens replacement on the right. Globes and orbital soft tissues demonstrate no obvious abnormality. Paranasal sinuses are largely clear. No significant mastoid effusion. Other:  None. IMPRESSION: 1. Technically limited exam due to extensive motion artifact. 2. No acute intracranial abnormality. 3. Age-related cerebral atrophy with moderately advanced chronic microvascular ischemic disease, with multiple remote lacunar infarcts involving the bilateral basal ganglia, thalami, and pons. Electronically Signed   By: Rise Mu M.D.   On: 07/06/2021 22:26   CT ABDOMEN PELVIS W CONTRAST  Result Date: 06/26/2021 CLINICAL DATA:  Abdominal pain for 3 days. EXAM: CT ABDOMEN AND PELVIS WITH CONTRAST TECHNIQUE: Multidetector CT imaging of the abdomen and pelvis was performed using the standard protocol following bolus administration of intravenous contrast. CONTRAST:  75mL OMNIPAQUE IOHEXOL 350 MG/ML SOLN COMPARISON:  04/27/2021 FINDINGS: Lower chest: Mild atelectasis and scarring in the lung bases. No pleural effusion. Coronary atherosclerosis. Normal heart size. Hepatobiliary: Diffusely decreased attenuation of the liver compatible with steatosis. Unremarkable gallbladder. No biliary dilatation. Pancreas: Unremarkable. Spleen: Unremarkable. Adrenals/Urinary Tract: Unremarkable adrenal glands. Subcentimeter hypodensities  in the kidneys, too small to fully characterize. No renal calculi or hydronephrosis. Unremarkable bladder. Stomach/Bowel: The stomach is unremarkable. There is no evidence of bowel obstruction or inflammation. The appendix is not clearly identified, however no inflammatory changes are seen in the right lower quadrant. Vascular/Lymphatic: Abdominal aortic atherosclerosis without aneurysm. No enlarged lymph nodes. Reproductive: Status post hysterectomy. No adnexal masses. Other: No intraperitoneal free fluid. Musculoskeletal: No acute osseous abnormality or suspicious osseous lesion. IMPRESSION: 1. No acute abnormality identified in the abdomen or pelvis. 2. Hepatic steatosis. 3. Aortic Atherosclerosis (ICD10-I70.0). Electronically Signed   By: Sebastian Ache M.D.   On:  06/26/2021 05:27   DG Abd 2 Views  Result Date: 07/07/2021 CLINICAL DATA:  Altered mental status. EXAM: ABDOMEN - 2 VIEW COMPARISON:  None. FINDINGS: The bowel gas pattern is normal. There is no evidence of free air. No radio-opaque calculi or other significant radiographic abnormality is seen. Scoliosis of spine is noted. Degenerative joint changes bilateral hips are identified. IMPRESSION: No bowel obstruction or free air. Electronically Signed   By: Sherian Rein M.D.   On: 07/07/2021 14:08   CT HEAD CODE STROKE WO CONTRAST`  Result Date: 07/07/2021 CLINICAL DATA:  Code stroke. Last known well 1:45 p.m., vomiting, high blood pressure. EXAM: CT HEAD WITHOUT CONTRAST TECHNIQUE: Contiguous axial images were obtained from the base of the skull through the vertex without intravenous contrast. COMPARISON:  Brain MRI 07/06/2021. Prior head CT examinations 07/06/2021 and earlier. FINDINGS: Brain: Cerebral volume is normal for age. Moderate patchy and ill-defined hypoattenuation within the cerebral white matter, nonspecific but compatible with chronic small vessel ischemic disease. Chronic lacunar infarcts within the bilateral basal ganglia, thalami and within the pons, some of which were better appreciated on the prior brain MRI of 07/06/2021. There is no acute intracranial hemorrhage. No demarcated cortical infarct. No extra-axial fluid collection. No evidence of an intracranial mass. No midline shift. Vascular: No hyperdense vessel.  Atherosclerotic calcifications. Skull: Normal. Negative for fracture or focal lesion. Sinuses/Orbits: Visualized orbits show no acute finding. Trace scattered paranasal sinus mucosal thickening at the imaged levels. ASPECTS ([place]berta Stroke Program Early CT Score) - Ganglionic level infarction (caudate, lentiform nuclei, internal capsule, insula, M1-M3 cortex): 7 - Supraganglionic infarction (M4-M6 cortex): 3 Total score (0-10 with 10 being normal): 10 These results were  communicated to Dr. Wilford Corner at 1:38 pmon 10/8/2022by text page via the [facility] messaging system. IMPRESSION: No evidence of acute intracranial abnormality. Known chronic lacunar infarcts within the bilateral basal ganglia, thalami and within the pons, some of which were better appreciated on the brain MRI of 07/06/2021. Moderate chronic small vessel ischemic changes within the cerebral white matter, stable. Electronically Signed   By: Jackey Loge D.O.   On: 07/07/2021 13:39    Microbiology: Recent Results (from the past 240 hour(s))  Resp Panel by RT-PCR (Flu A&B, Covid) Nasopharyngeal Swab     Status: None   Collection Time: 07/06/21 10:31 PM   Specimen: Nasopharyngeal Swab; Nasopharyngeal(NP) swabs in vial transport medium  Result Value Ref Range Status   SARS Coronavirus 2 by RT PCR NEGATIVE NEGATIVE Final    Comment: (NOTE) SARS-CoV-2 target nucleic acids are NOT DETECTED.  The SARS-CoV-2 RNA is generally detectable in upper respiratory specimens during the acute phase of infection. The lowest concentration of SARS-CoV-2 viral copies this assay can detect is 138 copies/mL. A negative result does not preclude SARS-Cov-2 infection and should not be used as the sole basis for treatment or other patient management  decisions. A negative result may occur with  improper specimen collection/handling, submission of specimen other than nasopharyngeal swab, presence of viral mutation(s) within the areas targeted by this assay, and inadequate number of viral copies(<138 copies/mL). A negative result must be combined with clinical observations, patient history, and epidemiological information. The expected result is Negative.  Fact Sheet for Patients:  BloggerCourse.com  Fact Sheet for Healthcare Providers:  SeriousBroker.it  This test is no t yet approved or cleared by the [place] FDA and  has been authorized for detection and/or diagnosis  of SARS-CoV-2 by FDA under an Emergency Use Authorization (EUA). This EUA will remain  in effect (meaning this test can be used) for the duration of the COVID-19 declaration under Section 564(b)(1) of the Act, 21 U.S.C.section 360bbb-3(b)(1), unless the authorization is terminated  or revoked sooner.       Influenza A by PCR NEGATIVE NEGATIVE Final   Influenza B by PCR NEGATIVE NEGATIVE Final    Comment: (NOTE) The Xpert Xpress SARS-CoV-2/FLU/RSV plus assay is intended as an aid in the diagnosis of influenza from Nasopharyngeal swab specimens and should not be used as a sole basis for treatment. Nasal washings and aspirates are unacceptable for Xpert Xpress SARS-CoV-2/FLU/RSV testing.  Fact Sheet for Patients: BloggerCourse.com  Fact Sheet for Healthcare Providers: SeriousBroker.it  This test is not yet approved or cleared by the [place] FDA and has been authorized for detection and/or diagnosis of SARS-CoV-2 by FDA under an Emergency Use Authorization (EUA). This EUA will remain in effect (meaning this test can be used) for the duration of the COVID-19 declaration under Section 564(b)(1) of the Act, 21 U.S.C. section 360bbb-3(b)(1), unless the authorization is terminated or revoked.  Performed at [facility], [address] [place], [place] [address]      Labs: Basic Metabolic Panel: Recent Labs  Lab 07/07/21 0601 07/07/21 1357 07/07/21 1829 07/08/21 0512 07/09/21 0919  NA 128* 127* 128* 131* 127*  K 3.3* 3.8 4.2 4.2 4.1  CL 92* 94* 94* 97* 93*  CO2 26 23 25 28 28   GLUCOSE 129* 139* 130* 117* 142*  BUN 6* 7* 8 11 16   CREATININE 0.61 0.64 0.72 0.69 1.11*  CALCIUM 9.5 9.4 9.9 9.5 9.5   Liver Function Tests: Recent Labs  Lab 07/06/21 1805  AST 16  ALT 12  ALKPHOS 54  BILITOT 0.9  PROT 8.0  ALBUMIN 4.1   No results for input(s): LIPASE, AMYLASE in the last 168 hours. Recent Labs  Lab  07/07/21 1357  AMMONIA <10   CBC: Recent Labs  Lab 07/06/21 1554 07/07/21 1357 07/08/21 0512  WBC 5.4 7.9 7.6  NEUTROABS 3.2  --  5.2  HGB 14.0 13.6 13.0  HCT 41.7 40.5 38.5  MCV 87.1 86.5 87.9  PLT 237 255 219   Cardiac Enzymes: No results for input(s): CKTOTAL, CKMB, CKMBINDEX, TROPONINI in the last 168 hours. BNP: BNP (last 3 results) No results for input(s): BNP in the last 8760 hours.  ProBNP (last 3 results) No results for input(s): PROBNP in the last 8760 hours.  CBG: Recent Labs  Lab 07/07/21 1311  GLUCAP 148*    Principal Problem:   AMS (altered mental status) Active Problems:   Hypothyroid   Hypertensive urgency   Type 2 diabetes mellitus (HCC)   Vascular dementia (HCC)   Anxiety   Depression   Hyponatremia   AKI (acute kidney injury) (HCC)   Time coordinating discharge: 38 minutes. Signed:  Arriyana Rodell, DO [facility]  07/10/2021, 12:19 PM

## 2021-07-10 NOTE — Clinical Social Work Note (Signed)
  RE:  Nicole Ramsey       Date of Birth: 21-Dec-1926      Date:   07/10/2021       To Whom It May Concern:  Please be advised that the above-named patient will require a short-term nursing home stay - anticipated 30 days or less for rehabilitation and strengthening.  The plan is for return home.  MD electronic signature noted below

## 2022-02-11 ENCOUNTER — Other Ambulatory Visit: Payer: Self-pay | Admitting: Internal Medicine

## 2022-02-11 DIAGNOSIS — N644 Mastodynia: Secondary | ICD-10-CM

## 2022-02-28 ENCOUNTER — Ambulatory Visit
Admission: RE | Admit: 2022-02-28 | Discharge: 2022-02-28 | Disposition: A | Discharge status: Home / Self Care | Payer: Medicare Other | Source: Ambulatory Visit | Attending: Internal Medicine | Admitting: Internal Medicine

## 2022-02-28 DIAGNOSIS — N644 Mastodynia: Secondary | ICD-10-CM | POA: Diagnosis present

## 2022-06-17 IMAGING — CT CT HEAD W/O CM
3 series · 15 of 47 positions shown, 18 images · non-contrast
Comparison: CT head without contrast 12/11/2018

CLINICAL DATA: Altered mental status.  Hallucinations.

EXAM:
CT HEAD WITHOUT CONTRAST
TECHNIQUE: Contiguous axial images were obtained from the base of the skull
through the vertex without intravenous contrast.

[Series 2: head wo · axial · 0.42mm/px · z∈[+174,+299]mm · 9 of 30 slices shown, 12 images]
[im 3/30  brain]
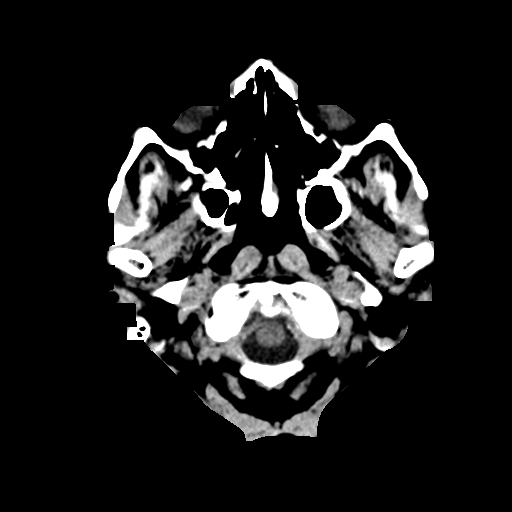
[im 3/30  bone]
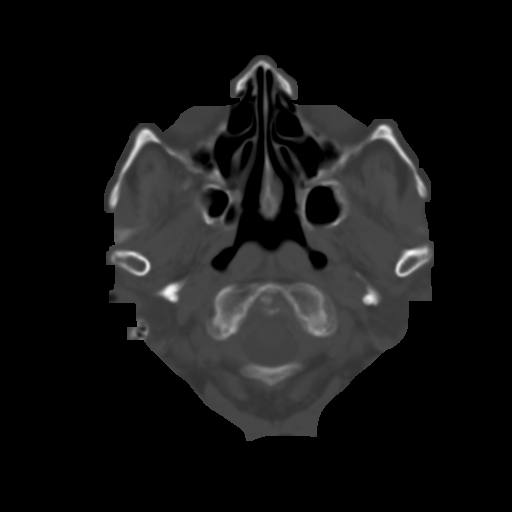
[im 6/30  brain]
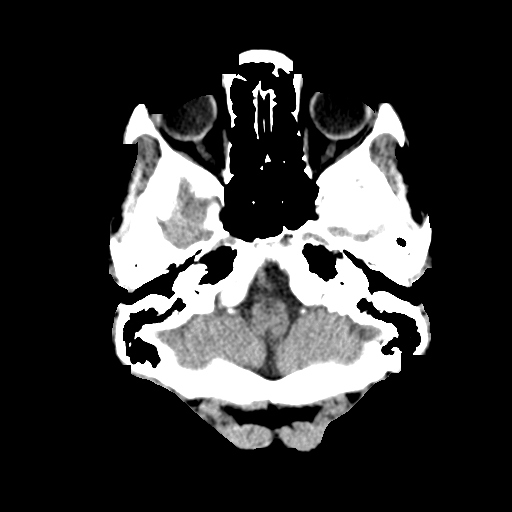
[im 9/30  brain]
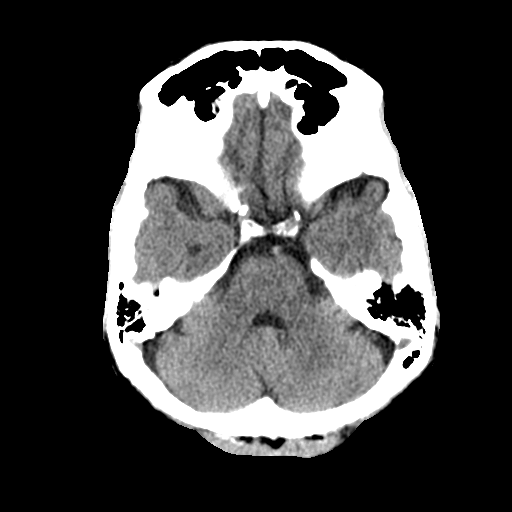
[im 12/30  brain]
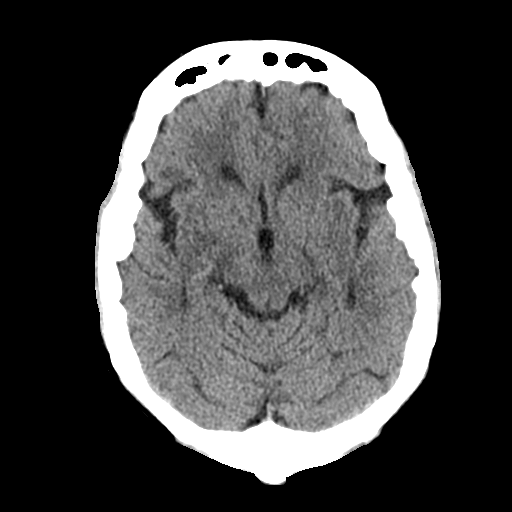
[im 16/30  brain]
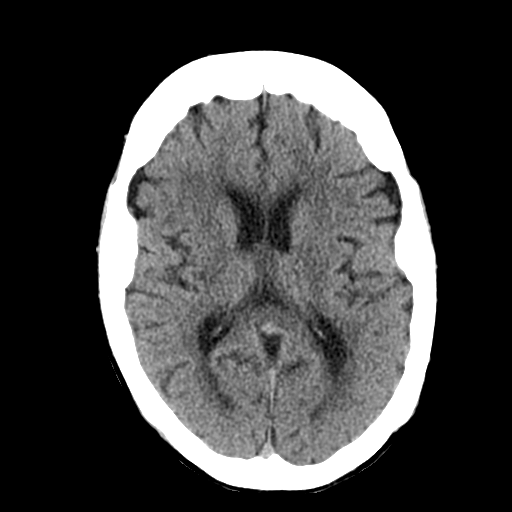
[im 16/30  bone]
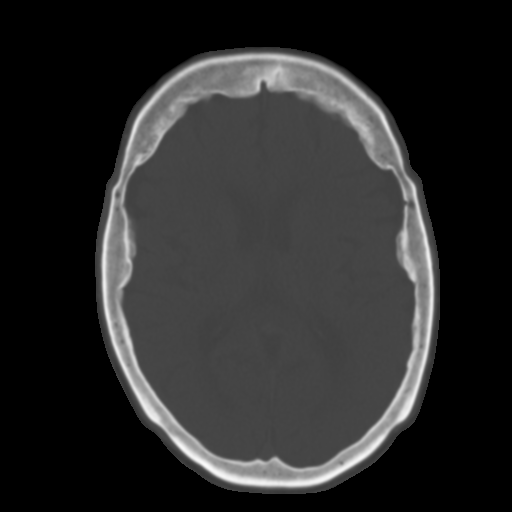
[im 19/30  brain]
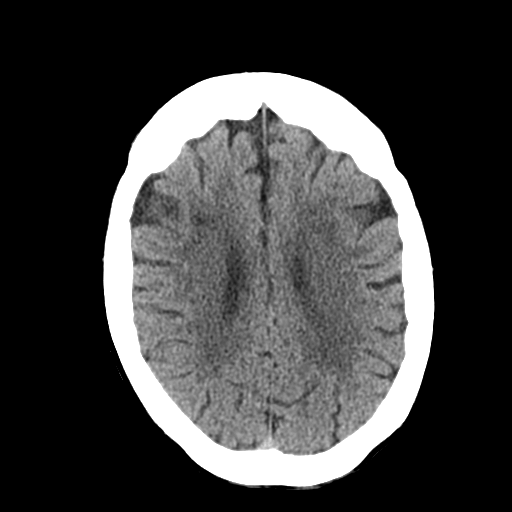
[im 22/30  brain]
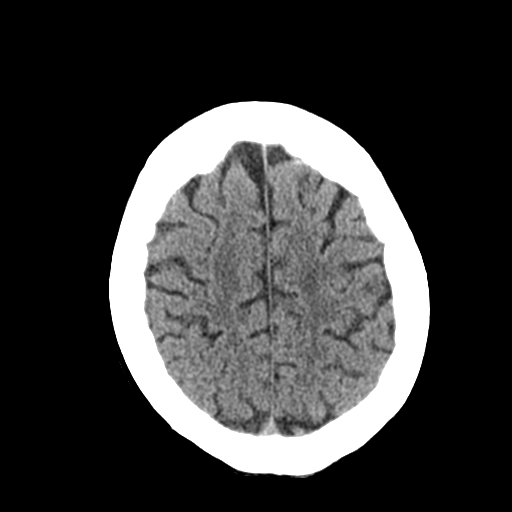
[im 25/30  brain]
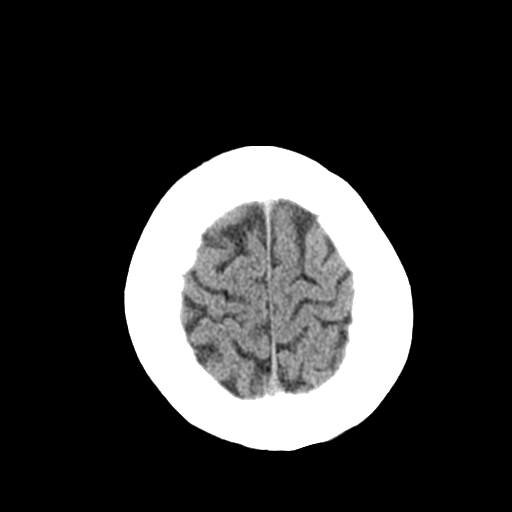
[im 28/30  brain]
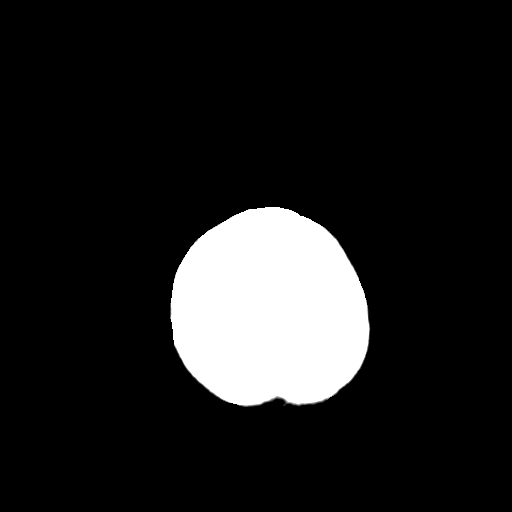
[im 28/30  bone]
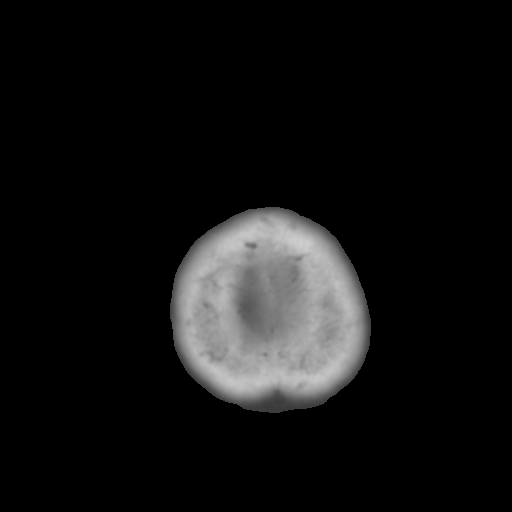

[Series 4: coronal soft tissue · coronal · 0.30mm/px · 3 of 65 slices shown]
[im 22/65  brain]
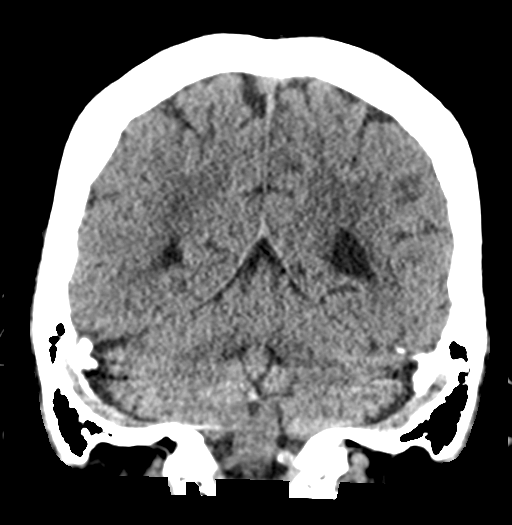
[im 29/65  brain]
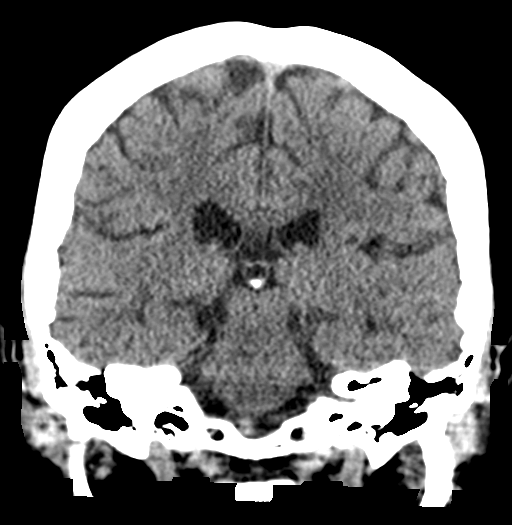
[im 36/65  brain]
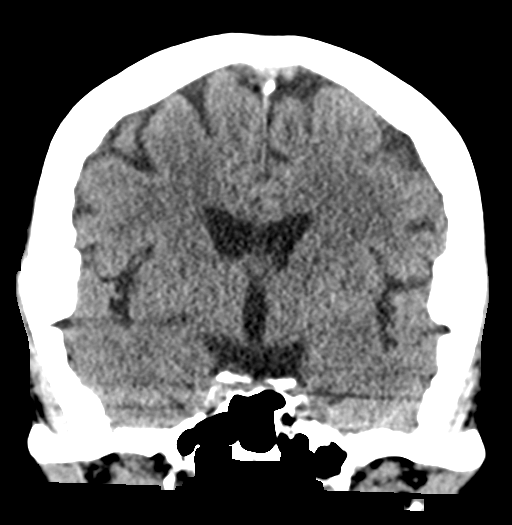

[Series 5: sagittal soft tissue · sagittal · 0.30mm/px · 3 of 53 slices shown]
[im 18/53  brain]
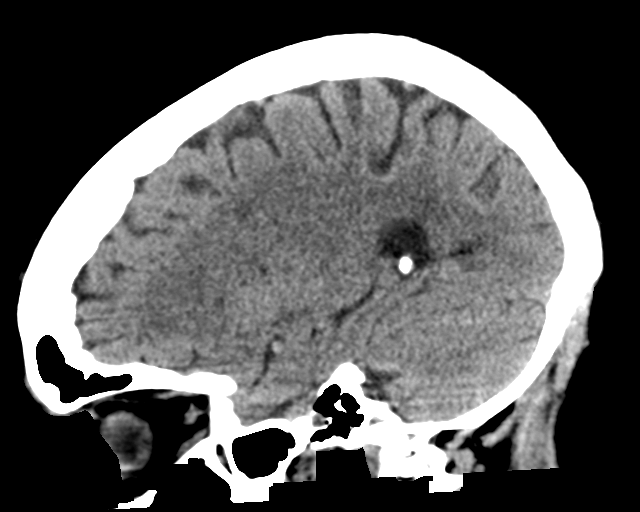
[im 27/53  brain]
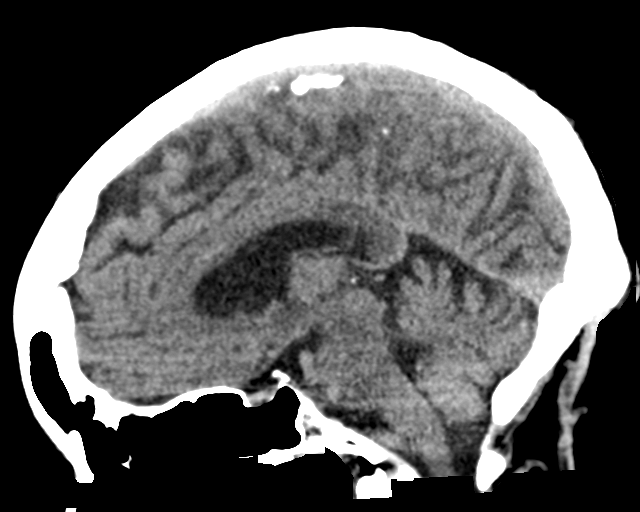
[im 35/53  brain]
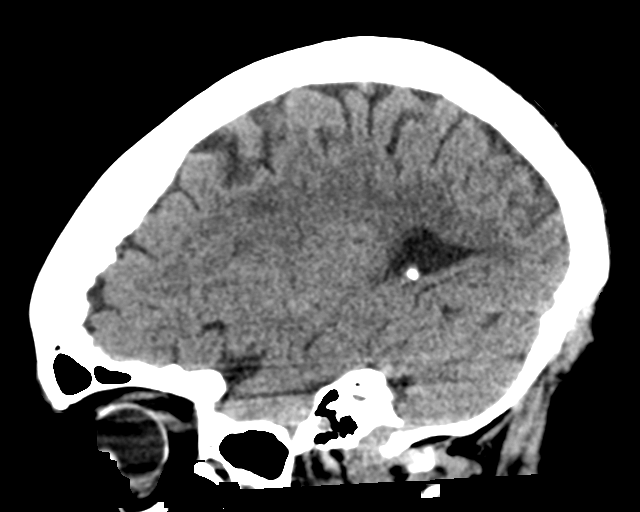

[15 of 47 positions shown; findings below may reference images not displayed]

FINDINGS: Brain: Moderate atrophy and white matter changes are similar the
prior study. No acute infarct, hemorrhage, or mass lesion is
present. Basal ganglia and insular ribbon are normal. The ventricles
are proportionate to the degree of atrophy. No significant
extraaxial fluid collection is present. Remote lacunar infarct in
the right paramedian brainstem is again seen. Brainstem and
cerebellum are otherwise unremarkable.

Vascular: Atherosclerotic calcifications are present within the
cavernous internal carotid arteries. No hyperdense vessel is
present.

Skull: Hyperostosis noted. Calvarium is otherwise within normal
limits. No significant extra-axial fluid collection is present.

Sinuses/Orbits: The paranasal sinuses and mastoid air cells are
clear. Right lens replacement is present. Globes and orbits are
otherwise within normal limits.
IMPRESSION: 1. No acute intracranial abnormality or significant interval change.
2. Stable atrophy and white matter disease. This likely reflects the
sequela of chronic microvascular ischemia.

## 2022-12-08 ENCOUNTER — Other Ambulatory Visit: Payer: Self-pay

## 2022-12-08 ENCOUNTER — Encounter: Payer: Self-pay | Admitting: Radiology

## 2022-12-08 ENCOUNTER — Emergency Department: Payer: Medicare Other

## 2022-12-08 ENCOUNTER — Emergency Department
Admission: EM | Admit: 2022-12-08 | Discharge: 2022-12-08 | Disposition: A | Discharge status: Home / Self Care | Payer: Medicare Other | Attending: Emergency Medicine | Admitting: Emergency Medicine

## 2022-12-08 DIAGNOSIS — W19XXXA Unspecified fall, initial encounter: Secondary | ICD-10-CM | POA: Diagnosis not present

## 2022-12-08 DIAGNOSIS — S299XXA Unspecified injury of thorax, initial encounter: Secondary | ICD-10-CM | POA: Diagnosis present

## 2022-12-08 DIAGNOSIS — S20211A Contusion of right front wall of thorax, initial encounter: Secondary | ICD-10-CM

## 2022-12-08 NOTE — ED Triage Notes (Signed)
Pt states she was walking and tripped on the rug in her home. PT complaining of pain her right rib cage.Denies hitting head or LOC and doesn't take any blood thinners.

## 2022-12-08 NOTE — Discharge Instructions (Signed)
Patients chest CT was reassuring, no fractures seen

## 2022-12-08 NOTE — ED Provider Notes (Signed)
   [facility] Provider Note    Event Date/Time   First MD Initiated Contact with Patient 12/08/22 1640     (approximate)   History   Fall   HPI  Nicole Ramsey is a 89 y.o. female presents after a fall, she struck the right side of her chest/posterior chest and complains of pain in that area.  No other injuries reported.  No head injury.  No neck pain.  No dizziness.  No nausea vomiting or abdominal pain.  No lower extremity injuries.     Physical Exam   Triage Vital Signs: ED Triage Vitals  Enc Vitals Group     BP 12/08/22 1623 (!) 141/55     Pulse Rate 12/08/22 1623 60     Resp 12/08/22 1623 17     Temp 12/08/22 1623 97.6 F (36.4 C)     Temp Source 12/08/22 1623 Oral     SpO2 12/08/22 1623 96 %     Weight 12/08/22 1621 66.7 kg (147 lb)     Height 12/08/22 1621 1.702 m (5\' 7" )     Head Circumference --      Peak Flow --      Pain Score --      Pain Loc --      Pain Edu? --      Excl. in [place]? --     Most recent vital signs: Vitals:   12/08/22 1715 12/08/22 1730  BP: (!) 152/55 (!) 150/56  Pulse: 65 63  Resp:    Temp:    SpO2: 94% 96%     General: Awake, no distress.  CV:  Good peripheral perfusion.  Mild tenderness to the right lateral chest wall approximately at fourth fifth rib and posterior rib cage as well Resp:  Normal effort.  Abd:  No distention.  Soft, nontender Other:  No pain with axial loads on both hips, no vertebral tenderness to palpation   ED Results / Procedures / Treatments   Labs (all labs ordered are listed, but only abnormal results are displayed) Labs Reviewed - No data to display   EKG     RADIOLOGY Chest x-ray viewed interpret by me, no fracture noted    PROCEDURES:  Critical Care performed:   Procedures   MEDICATIONS ORDERED IN ED: Medications - No data to display   IMPRESSION / MDM / [place] / ED COURSE  I reviewed the triage vital signs and the nursing  notes. Patient's presentation is most consistent with acute illness / injury with system symptoms.   Patient presents after mechanical fall as detailed above.  Suspicious for possible rib fracture versus rib contusion.  Chest x-ray is overall reassuring, will send for CT chest to evaluate more detail.  CT scan is negative for fracture, presentation most consistent with contusion, recommend supportive care, outpatient follow-up, appropriate for discharge at this time       FINAL CLINICAL IMPRESSION(S) / ED DIAGNOSES   Final diagnoses:  Chest wall contusion, right, initial encounter     Rx / DC Orders   ED Discharge Orders     None        Note:  This document was prepared using Dragon voice recognition software and may include unintentional dictation errors.   Lavonia Drafts, MD 12/08/22 [phone]

## 2024-01-16 ENCOUNTER — Inpatient Hospital Stay
Admission: EM | Admit: 2024-01-16 | Discharge: 2024-01-27 | DRG: 480 | Disposition: A | Discharge status: Skilled Nursing Facility | Attending: Student in an Organized Health Care Education/Training Program | Admitting: Student in an Organized Health Care Education/Training Program

## 2024-01-16 ENCOUNTER — Encounter: Payer: Self-pay | Admitting: Internal Medicine

## 2024-01-16 ENCOUNTER — Emergency Department

## 2024-01-16 ENCOUNTER — Other Ambulatory Visit: Payer: Self-pay

## 2024-01-16 DIAGNOSIS — I5032 Chronic diastolic (congestive) heart failure: Secondary | ICD-10-CM | POA: Diagnosis present

## 2024-01-16 DIAGNOSIS — D62 Acute posthemorrhagic anemia: Secondary | ICD-10-CM | POA: Diagnosis not present

## 2024-01-16 DIAGNOSIS — S7292XA Unspecified fracture of left femur, initial encounter for closed fracture: Secondary | ICD-10-CM | POA: Diagnosis present

## 2024-01-16 DIAGNOSIS — R339 Retention of urine, unspecified: Secondary | ICD-10-CM | POA: Diagnosis not present

## 2024-01-16 DIAGNOSIS — F0284 Dementia in other diseases classified elsewhere, unspecified severity, with anxiety: Secondary | ICD-10-CM | POA: Diagnosis present

## 2024-01-16 DIAGNOSIS — Z79890 Hormone replacement therapy (postmenopausal): Secondary | ICD-10-CM

## 2024-01-16 DIAGNOSIS — Z66 Do not resuscitate: Secondary | ICD-10-CM | POA: Diagnosis present

## 2024-01-16 DIAGNOSIS — W010XXA Fall on same level from slipping, tripping and stumbling without subsequent striking against object, initial encounter: Secondary | ICD-10-CM | POA: Diagnosis present

## 2024-01-16 DIAGNOSIS — F0283 Dementia in other diseases classified elsewhere, unspecified severity, with mood disturbance: Secondary | ICD-10-CM | POA: Diagnosis present

## 2024-01-16 DIAGNOSIS — S72141A Displaced intertrochanteric fracture of right femur, initial encounter for closed fracture: Principal | ICD-10-CM | POA: Diagnosis present

## 2024-01-16 DIAGNOSIS — Z8673 Personal history of transient ischemic attack (TIA), and cerebral infarction without residual deficits: Secondary | ICD-10-CM

## 2024-01-16 DIAGNOSIS — F02818 Dementia in other diseases classified elsewhere, unspecified severity, with other behavioral disturbance: Secondary | ICD-10-CM | POA: Diagnosis present

## 2024-01-16 DIAGNOSIS — F01518 Vascular dementia, unspecified severity, with other behavioral disturbance: Secondary | ICD-10-CM | POA: Diagnosis present

## 2024-01-16 DIAGNOSIS — F419 Anxiety disorder, unspecified: Secondary | ICD-10-CM | POA: Diagnosis present

## 2024-01-16 DIAGNOSIS — F0150 Vascular dementia without behavioral disturbance: Secondary | ICD-10-CM | POA: Diagnosis not present

## 2024-01-16 DIAGNOSIS — F0153 Vascular dementia, unspecified severity, with mood disturbance: Secondary | ICD-10-CM | POA: Diagnosis present

## 2024-01-16 DIAGNOSIS — Z79899 Other long term (current) drug therapy: Secondary | ICD-10-CM

## 2024-01-16 DIAGNOSIS — I5030 Unspecified diastolic (congestive) heart failure: Secondary | ICD-10-CM | POA: Insufficient documentation

## 2024-01-16 DIAGNOSIS — I110 Hypertensive heart disease with heart failure: Secondary | ICD-10-CM | POA: Diagnosis present

## 2024-01-16 DIAGNOSIS — Y92009 Unspecified place in unspecified non-institutional (private) residence as the place of occurrence of the external cause: Secondary | ICD-10-CM

## 2024-01-16 DIAGNOSIS — Z6822 Body mass index (BMI) 22.0-22.9, adult: Secondary | ICD-10-CM

## 2024-01-16 DIAGNOSIS — E876 Hypokalemia: Secondary | ICD-10-CM | POA: Insufficient documentation

## 2024-01-16 DIAGNOSIS — E611 Iron deficiency: Secondary | ICD-10-CM | POA: Diagnosis not present

## 2024-01-16 DIAGNOSIS — I10 Essential (primary) hypertension: Secondary | ICD-10-CM | POA: Diagnosis present

## 2024-01-16 DIAGNOSIS — E039 Hypothyroidism, unspecified: Secondary | ICD-10-CM | POA: Diagnosis present

## 2024-01-16 DIAGNOSIS — Z751 Person awaiting admission to adequate facility elsewhere: Secondary | ICD-10-CM

## 2024-01-16 DIAGNOSIS — E43 Unspecified severe protein-calorie malnutrition: Secondary | ICD-10-CM | POA: Diagnosis present

## 2024-01-16 DIAGNOSIS — G309 Alzheimer's disease, unspecified: Secondary | ICD-10-CM | POA: Diagnosis present

## 2024-01-16 DIAGNOSIS — F331 Major depressive disorder, recurrent, moderate: Secondary | ICD-10-CM | POA: Diagnosis present

## 2024-01-16 DIAGNOSIS — S728X2A Other fracture of left femur, initial encounter for closed fracture: Secondary | ICD-10-CM | POA: Diagnosis not present

## 2024-01-16 DIAGNOSIS — F0154 Vascular dementia, unspecified severity, with anxiety: Secondary | ICD-10-CM | POA: Diagnosis present

## 2024-01-16 DIAGNOSIS — R338 Other retention of urine: Secondary | ICD-10-CM | POA: Insufficient documentation

## 2024-01-16 DIAGNOSIS — S72351A Displaced comminuted fracture of shaft of right femur, initial encounter for closed fracture: Secondary | ICD-10-CM

## 2024-01-16 DIAGNOSIS — S72001A Fracture of unspecified part of neck of right femur, initial encounter for closed fracture: Principal | ICD-10-CM

## 2024-01-16 LAB — CBC
HCT: 35.6 % — ABNORMAL LOW (ref 36.0–46.0)
Hemoglobin: 11.4 g/dL — ABNORMAL LOW (ref 12.0–15.0)
MCH: 28.4 pg (ref 26.0–34.0)
MCHC: 32 g/dL (ref 30.0–36.0)
MCV: 88.6 fL (ref 80.0–100.0)
Platelets: 162 10*3/uL (ref 150–400)
RBC: 4.02 MIL/uL (ref 3.87–5.11)
RDW: 15 % (ref 11.5–15.5)
WBC: 9.5 10*3/uL (ref 4.0–10.5)
nRBC: 0 % (ref 0.0–0.2)

## 2024-01-16 LAB — COMPREHENSIVE METABOLIC PANEL WITH GFR
ALT: 7 U/L (ref 0–44)
AST: 15 U/L (ref 15–41)
Albumin: 3.2 g/dL — ABNORMAL LOW (ref 3.5–5.0)
Alkaline Phosphatase: 36 U/L — ABNORMAL LOW (ref 38–126)
Anion gap: 10 (ref 5–15)
BUN: 10 mg/dL (ref 8–23)
CO2: 22 mmol/L (ref 22–32)
Calcium: 8.2 mg/dL — ABNORMAL LOW (ref 8.9–10.3)
Chloride: 108 mmol/L (ref 98–111)
Creatinine, Ser: 0.77 mg/dL (ref 0.44–1.00)
GFR, Estimated: >60 mL/min (ref >60)
Glucose, Bld: 141 mg/dL — ABNORMAL HIGH (ref 70–99)
Potassium: 3.1 mmol/L — ABNORMAL LOW (ref 3.5–5.1)
Sodium: 140 mmol/L (ref 135–145)
Total Bilirubin: 0.8 mg/dL (ref 0.0–1.2)
Total Protein: 6.1 g/dL — ABNORMAL LOW (ref 6.5–8.1)

## 2024-01-16 LAB — MAGNESIUM: Magnesium: 1.9 mg/dL (ref 1.7–2.4)

## 2024-01-16 MED ORDER — HYDROCODONE-ACETAMINOPHEN 5-325 MG PO TABS
1.0000 | ORAL_TABLET | Freq: Four times a day (QID) | ORAL | Status: AC | PRN
  Administered 2024-01-16 – 2024-01-18 (×2): 1 via ORAL
  Filled 2024-01-16 (×2): qty 1

## 2024-01-16 MED ORDER — QUETIAPINE FUMARATE 25 MG PO TABS
25.0000 mg | ORAL_TABLET | Freq: Every day | ORAL | Status: DC
Start: 2024-01-17 — End: 2024-01-27
  Administered 2024-01-17 – 2024-01-26 (×10): 25 mg via ORAL
  Filled 2024-01-16 (×10): qty 1

## 2024-01-16 MED ORDER — MORPHINE SULFATE (PF) 2 MG/ML IV SOLN
2.0000 mg | Freq: Once | INTRAVENOUS | Status: AC
  Administered 2024-01-16: 2 mg via INTRAVENOUS
  Filled 2024-01-16: qty 1

## 2024-01-16 MED ORDER — MECLIZINE HCL 25 MG PO TABS: 25.0000 mg | ORAL_TABLET | Freq: Three times a day (TID) | ORAL | Status: DC | PRN

## 2024-01-16 MED ORDER — ONDANSETRON HCL 4 MG/2ML IJ SOLN
4.0000 mg | Freq: Once | INTRAMUSCULAR | Status: AC
  Administered 2024-01-16: 4 mg via INTRAVENOUS
  Filled 2024-01-16: qty 2

## 2024-01-16 MED ORDER — POTASSIUM CITRATE-CITRIC ACID 1100-334 MG/5ML PO SOLN
30.0000 meq | Freq: Once | ORAL | Status: AC
  Administered 2024-01-16: 30 meq via ORAL
  Filled 2024-01-16 (×2): qty 15

## 2024-01-16 MED ORDER — SENNOSIDES-DOCUSATE SODIUM 8.6-50 MG PO TABS: 1.0000 | ORAL_TABLET | Freq: Every evening | ORAL | Status: DC | PRN

## 2024-01-16 MED ORDER — LORAZEPAM 0.5 MG PO TABS: 0.5000 mg | ORAL_TABLET | Freq: Four times a day (QID) | ORAL | Status: DC | PRN

## 2024-01-16 MED ORDER — ONDANSETRON HCL 4 MG/2ML IJ SOLN: 4.0000 mg | Freq: Four times a day (QID) | INTRAMUSCULAR | Status: DC | PRN

## 2024-01-16 MED ORDER — CITALOPRAM HYDROBROMIDE 10 MG PO TABS
10.0000 mg | ORAL_TABLET | Freq: Every day | ORAL | Status: DC
  Administered 2024-01-18 – 2024-01-27 (×9): 10 mg via ORAL
  Filled 2024-01-16 (×10): qty 1

## 2024-01-16 MED ORDER — ATENOLOL 25 MG PO TABS
50.0000 mg | ORAL_TABLET | Freq: Two times a day (BID) | ORAL | Status: DC
  Administered 2024-01-16: 50 mg via ORAL
  Filled 2024-01-16 (×2): qty 2

## 2024-01-16 MED ORDER — DOCUSATE SODIUM 100 MG PO CAPS
100.0000 mg | ORAL_CAPSULE | Freq: Two times a day (BID) | ORAL | Status: DC
  Administered 2024-01-16: 100 mg via ORAL
  Filled 2024-01-16: qty 1

## 2024-01-16 MED ORDER — MELATONIN 5 MG PO TABS: 5.0000 mg | ORAL_TABLET | Freq: Every evening | ORAL | Status: DC | PRN

## 2024-01-16 MED ORDER — HYDRALAZINE HCL 25 MG PO TABS: 25.0000 mg | ORAL_TABLET | Freq: Two times a day (BID) | ORAL | Status: DC

## 2024-01-16 MED ORDER — AMLODIPINE BESYLATE 10 MG PO TABS
10.0000 mg | ORAL_TABLET | Freq: Every day | ORAL | Status: DC
  Filled 2024-01-16: qty 1

## 2024-01-16 MED ORDER — ALPRAZOLAM 0.25 MG PO TABS
0.2500 mg | ORAL_TABLET | Freq: Every evening | ORAL | Status: DC | PRN
  Administered 2024-01-16 – 2024-01-24 (×5): 0.25 mg via ORAL
  Filled 2024-01-16 (×5): qty 1

## 2024-01-16 MED ORDER — LEVOTHYROXINE SODIUM 50 MCG PO TABS
50.0000 ug | ORAL_TABLET | Freq: Every day | ORAL | Status: DC
  Administered 2024-01-17 – 2024-01-27 (×11): 50 ug via ORAL
  Filled 2024-01-16 (×11): qty 1

## 2024-01-16 MED ORDER — CEFAZOLIN SODIUM-DEXTROSE 2-4 GM/100ML-% IV SOLN
2.0000 g | INTRAVENOUS | Status: AC
  Administered 2024-01-17: 2 g via INTRAVENOUS
  Filled 2024-01-16: qty 100

## 2024-01-16 MED ORDER — MORPHINE SULFATE (PF) 2 MG/ML IV SOLN
0.5000 mg | INTRAVENOUS | Status: AC | PRN
  Administered 2024-01-17: 0.5 mg via INTRAVENOUS
  Filled 2024-01-16: qty 1

## 2024-01-16 MED ORDER — HYDRALAZINE HCL 20 MG/ML IJ SOLN: 5.0000 mg | Freq: Four times a day (QID) | INTRAMUSCULAR | Status: DC | PRN

## 2024-01-16 MED ORDER — LACTATED RINGERS IV SOLN: INTRAVENOUS | Status: DC

## 2024-01-16 MED ORDER — ENSURE ENLIVE PO LIQD
237.0000 mL | Freq: Two times a day (BID) | ORAL | Status: DC
  Administered 2024-01-17 – 2024-01-27 (×19): 237 mL via ORAL

## 2024-01-16 MED ORDER — ORAL CARE MOUTH RINSE: 15.0000 mL | OROMUCOSAL | Status: DC | PRN

## 2024-01-16 MED ORDER — PANTOPRAZOLE SODIUM 40 MG PO TBEC
40.0000 mg | DELAYED_RELEASE_TABLET | Freq: Two times a day (BID) | ORAL | Status: DC
Start: 2024-01-16 — End: 2024-01-27
  Administered 2024-01-16 – 2024-01-27 (×20): 40 mg via ORAL
  Filled 2024-01-16 (×21): qty 1

## 2024-01-16 NOTE — ED Provider Notes (Signed)
 [facility] Provider Note    Event Date/Time   First MD Initiated Contact with Patient 01/16/24 1358     (approximate)  History   Chief Complaint: Fall  HPI  Nicole Ramsey is a 89 y.o. female with a past medical history of hypertension who presents to the emergency department following a fall.  Patient is coming from home has a history of dementia is alert and oriented x 2 which per report is her baseline.  Patient had a fall and is complaining of right hip pain unable to stand up or bear weight.  Patient denies any other complaints however does have a documented history of dementia.  Physical Exam   Triage Vital Signs: ED Triage Vitals  Encounter Vitals Group     BP      Systolic BP Percentile      Diastolic BP Percentile      Pulse      Resp      Temp      Temp src      SpO2      Weight      Height      Head Circumference      Peak Flow      Pain Score      Pain Loc      Pain Education      Exclude from Growth Chart     Most recent vital signs: There were no vitals filed for this visit.  General: Awake, no distress.  No obvious signs of head trauma or neck tenderness CV:  Good peripheral perfusion.  Regular rate and rhythm  Resp:  Normal effort.  Equal breath sounds bilaterally.  Abd:  No distention.  Soft, nontender.  No rebound or guarding. Other:  Patient does have moderate tenderness palpation of the right hip and pain with any attempted range of motion.  Neurovascularly intact with 1+ DP pulse.   ED Results / Procedures / Treatments   EKG  EKG viewed and interpreted by myself shows what appears to be a sinus rhythm at 64 bpm with a slightly widened QRS, normal axis, slight QTc prolongation otherwise normal intervals nonspecific ST changes.  RADIOLOGY  I have reviewed and interpreted the x-ray images.  Patient appears to have a right intertrochanteric fracture. Radiology shows a comminuted and displaced/angulated fracture  of the right proximal femur.   MEDICATIONS ORDERED IN ED: Medications - No data to display   IMPRESSION / MDM / ASSESSMENT AND PLAN / ED COURSE  I reviewed the triage vital signs and the nursing notes.  Patient's presentation is most consistent with acute presentation with potential threat to life or bodily function.  Patient presents to the emergency department after a fall at home.  History of dementia.  Here the patient is awake alert pleasant she has no complaints but does complain of pain in hip when you attempt to palpate or move.  Given the patient's history of dementia we will obtain CT imaging of the head as a precaution we will check labs obtain a chest x-ray for preop purposes as well as a right hip x-ray for suspected right hip fracture.  X-ray confirms right hip fracture.  We will discuss with orthopedics we will check labs and admit to the hospital service once our emergency department workup is been completed.  I spoke with Dr. Daun Epstein who has reviewed the films, will plan on surgery tomorrow morning.  We will make the patient n.p.o. at midnight.  Awaiting CT results and labs, patient will require admission to the hospital service once this has been completed.  Patient care signed out to oncoming provider.  FINAL CLINICAL IMPRESSION(S) / ED DIAGNOSES   Right hip fracture   Note:  This document was prepared using Dragon voice recognition software and may include unintentional dictation errors.   Ruth Cove, MD 01/16/24 1513

## 2024-01-16 NOTE — H&P (Signed)
 History and Physical   Nicole Ramsey UEA:[id number] DOB: 1927/08/07 DOA: 01/16/2024  PCP: Little Riff, MD  Patient coming from: Home via EMS  I have personally briefly reviewed patient's old medical records in [facility] EMR.  Chief Concern: Fall and right hip pain  HPI: Ms. Nicole Ramsey is a 89 year old female with history of dementia, history of left thalamus lacunar infarct, hypertension, hypothyroid, GERD, who presents emergency department from home via EMS for chief concerns of a fall and complaints of right hip pain  Vitals in the ED showed temperature 97.8, respiration rate 19, heart rate 61, blood pressure 146/54, SpO2 97% on room air.  CBC reveals 140, potassium 3.1, chloride 108, bicarb 22, BUN of 10, serum creatinine 0.77, EGFR greater than 60, nonfasting blood glucose 141, WBC 9.5, hemoglobin 11.4, platelets of 162.  Alk phos was elevated at 36.  Albumin was 3.2.  ED treatment: Morphine  2 mg IV 2 doses were given, ondansetron  4 mg IV one-time dose. ----------------------------------------- At bedside, patient is able to tell me her first and last name, age, location, current calendar year.  Patient reports she fell at home.  And then her right hip is hurting.  She reports besides her right hip she is not hurting anywhere else including chest, abdominal pain.  She denies fever, chills, cough, nausea, vomiting.  She reports she is hungry.  Social history: She is at home on her own.  Her son takes her to the grocery store.  And she has helpers who alternate to help her with cooking and cleaning.  She denies tobacco, EtOH, recreational drug use.  She is retired.  ROS: Constitutional: no weight change, no fever ENT/Mouth: no sore throat, no rhinorrhea Eyes: no eye pain, no vision changes Cardiovascular: no chest pain, no dyspnea,  no edema, no palpitations Respiratory: no cough, no sputum, no wheezing Gastrointestinal: no nausea, no vomiting, no diarrhea, no  constipation Genitourinary: no urinary incontinence, no dysuria, no hematuria Musculoskeletal: no arthralgias, no myalgias, + right hip pain Skin: no skin lesions, no pruritus, Neuro: + weakness, no loss of consciousness, no syncope Psych: no anxiety, no depression, + decrease appetite Heme/Lymph: no bruising, no bleeding  ED Course: Discussed with EDP, patient requiring hospitalization for chief concerns of falls and left hip fracture.  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 Daun Epstein is aware Symptomatic support: Hydrocodone -acetaminophen  5-325 mg p.o. every 6 hours as needed for moderate pain, 2 days ordered; morphine  0.5 mg IV every 3 hours as needed for severe pain, 1 day ordered NPO after midnight in anticipation of OR tomorrow  Heart failure with preserved ejection fraction (HCC) 10/31/2020: Estimated ejection fraction 65 to 70%, grade 1 diastolic dysfunction Not in acute exacerbation at this time  Hypokalemia Check magnesium  level on admission Potassium supplementation with Polycitra 30 mill equivalent p.o. one-time dose ordered Recheck BMP in a.m.  Anxiety Home alprazolam  0.25 mg p.o. nightly as needed for anxiety, sleep resumed on admission  Hypertension Home amlodipine  10  mg daily, atenolol  50 mg p.o. twice daily, hydralazine  25 mg twice daily resumed on admission Hydralazine  5 mg IV every 6 hours as needed for SBP greater 165, 5 days  ordered  Hypothyroid Levothyroxine  50 mcg daily resumed  Depression, major, recurrent, moderate (HCC) Home citalopram  10 mg daily, quetiapine  25 mg nightly were resumed on admission  Chart reviewed.   DVT prophylaxis: TED hose; AM team to initiate pharmacologic DVT prophylaxis when the benefits outweigh the risk Code Status: DNR/DNI, confirmed with ACP documents Diet: heart healthy Family Communication: Updated son, Mr. Madia Carvell. Attempted to call daughter Sherley Distad) and there was no pick up. Son, Senovia Gauer and Arlethia lives in [place]. Disposition Plan: Pending clinical course Consults called: orthopedic service  Admission status: Telemetry medical, inpatient  Past Medical History:  Diagnosis Date   Hypertension    Thyroid  disease    History reviewed. No pertinent surgical history.  Social History:  reports that she has never smoked. She has never used smokeless tobacco. She reports that she does not currently use alcohol. She reports that she does not use drugs.  Allergies  Allergen Reactions   Aspirin Other (See Comments)    Elevated blood pressure   Family History  Problem Relation Age of Onset   Stroke Mother    Stroke Sister    Family history: Family history reviewed and not pertinent.  Prior to Admission medications   Medication Sig Start Date End Date Taking? Authorizing Provider  ALPRAZolam  (XANAX ) 0.25 MG tablet Take 0.25 mg by mouth at bedtime as needed for anxiety or sleep. 10/23/23  Yes [provider]  amLODipine  (NORVASC ) 10 MG tablet Take 10 mg by mouth daily. 09/12/20   [provider]  atenolol  (TENORMIN ) 50 MG tablet Take 1 tablet (50 mg total) by mouth 2 (two) times daily. 04/27/21 07/06/21  Bradler, Evan K, MD  Bacillus Coagulans-Inulin (PROBIOTIC) 1-250 BILLION-MG CAPS Take 1 capsule by mouth daily. 06/26/21   Isa Manuel, MD  citalopram  (CELEXA ) 10 MG tablet Take 10 mg by mouth daily.    [provider]   cyanocobalamin (,VITAMIN B-12,) 1000 MCG/ML injection Inject 1,000 mcg into the muscle every 30 (thirty) days.    [provider]  feeding supplement (ENSURE ENLIVE / ENSURE PLUS) LIQD Take 237 mLs by mouth 2 (two) times daily between meals. 11/10/20   Donaciano Frizzle, MD  hydrALAZINE  (APRESOLINE ) 25 MG tablet Take 25 mg by mouth 2 (two) times daily.    [provider]  levothyroxine  (SYNTHROID ) 50 MCG tablet Take 50 mcg by mouth daily. 10/11/20   [provider]  meclizine  (ANTIVERT ) 25 MG tablet Take 25 mg by mouth 3 (three) times daily as needed for dizziness.    [provider]  pantoprazole  (PROTONIX ) 40 MG tablet Take 1 tablet (40 mg total) by mouth 2 (two) times daily. 07/10/21   Swayze, Ava, DO  polyethylene glycol (MIRALAX  / GLYCOLAX ) 17 g packet Take 17 g by mouth daily. 07/11/21   Swayze, Ava, DO  QUEtiapine  (SEROQUEL ) 25 MG tablet Take 25 mg by mouth at bedtime. 10/11/20   [provider]  Vitamin D, Ergocalciferol, (DRISDOL) 1.25 MG (50000 UNIT) CAPS capsule Take 50,000 Units by mouth once a week. 05/18/20   [provider]   Physical Exam: Vitals:   01/16/24 1548 01/16/24 1633  BP: (!) 146/54   Pulse: 61   Resp: 19   Temp:  97.8 F (36.6 C)  TempSrc:  Oral  SpO2: 97%  Constitutional: appears age appropriate, frail, NAD, calm Eyes: PERRL, lids and conjunctivae normal ENMT: Mucous membranes are dry. Posterior pharynx clear of any exudate or lesions. Age-appropriate dentition. Hearing appropriate Neck: normal, supple, no masses, no thyromegaly Respiratory: clear to auscultation bilaterally, no wheezing, no crackles. Normal respiratory effort. No accessory muscle use.  Cardiovascular: Regular rate and rhythm, no murmurs / rubs / gallops. No extremity edema. 2+ pedal pulses. No carotid bruits.  Abdomen: no tenderness, no masses palpated, no hepatosplenomegaly. Bowel sounds positive.  Musculoskeletal: no clubbing / cyanosis. No  joint deformity upper and lower extremities. Decreased ROM of the right lower extremity, no contractures, no atrophy. Normal muscle tone.  Skin: no rashes, lesions, ulcers. No induration Neurologic: Sensation intact. Strength 5/5 in all 4.  Psychiatric: Normal judgment and insight. Alert and oriented x 3. Normal mood.   EKG: independently reviewed, showing sinus rhythm with rate of 64, QTc 509, right bundle branch block  Chest x-ray on Admission: I personally reviewed and I agree with radiologist reading as below.  CT HEAD WO CONTRAST ( ) Result Date: 01/16/2024 CLINICAL DATA:  Head trauma, minor. EXAM: CT HEAD WITHOUT CONTRAST TECHNIQUE: Contiguous axial images were obtained from the base of the skull through the vertex without intravenous contrast. RADIATION DOSE REDUCTION: This exam was performed according to the departmental dose-optimization program which includes automated exposure control, adjustment of the mA and/or kV according to patient size and/or use of iterative reconstruction technique. COMPARISON:  Head CT 07/07/2021.  MRI brain 07/06/2021. FINDINGS: Brain: No acute hemorrhage. Stable background of moderate chronic small-vessel disease and old lacunar infarct in the left thalamus. Cortical gray-white differentiation is otherwise preserved. Prominence of the ventricles and sulci within expected range for age. No hydrocephalus or extra-axial collection. No mass effect or midline shift. Vascular: No hyperdense vessel or unexpected calcification. Skull: No calvarial fracture or suspicious bone lesion. Skull base is unremarkable. Sinuses/Orbits: No acute findings. Other: None. IMPRESSION: 1. No acute intracranial abnormality. 2. Stable background of moderate chronic small-vessel disease and old lacunar infarct in the left thalamus. Electronically Signed   By: Audra Blend M.D.   On: 01/16/2024 15:33   DG Chest 1 View Result Date: 01/16/2024 CLINICAL DATA:  Fall. EXAM: CHEST  1 VIEW  COMPARISON:  12/08/2022. FINDINGS: Low lung volume. Mild prominence of interstitial markings is nonspecific and may be secondary to underlying age-related fibrosis/scarring. Bilateral lung fields are otherwise clear. No dense consolidation or lung collapse. Bilateral costophrenic angles are clear. Normal cardio-mediastinal silhouette. No acute osseous abnormalities. The soft tissues are within normal limits. IMPRESSION: No active disease. Electronically Signed   By: Beula Brunswick M.D.   On: 01/16/2024 14:35   DG HIP UNILAT W OR W/O PELVIS 2-3 VIEWS RIGHT Result Date: 01/16/2024 CLINICAL DATA:  Fall.  Right hip pain. EXAM: DG HIP (WITH OR WITHOUT PELVIS) 2-3V RIGHT COMPARISON:  None Available. FINDINGS: There is comminuted and displaced/angulated fracture of the proximal right femur. No other acute fracture or dislocation. No aggressive osseous lesion. Visualized sacral arcuate lines are unremarkable. Unremarkable symphysis pubis. There are mild-to-moderate degenerative changes of bilateral hip joints characterized by joint space narrowing and osteophytosis of the superior acetabulum. No radiopaque foreign bodies. IMPRESSION: *Comminuted and displaced/angulated fracture of the proximal right femur. Electronically Signed   By: Beula Brunswick M.D.   On: 01/16/2024 14:34   Labs on Admission: I have personally reviewed following labs  CBC: Recent Labs  Lab 01/16/24 1459  WBC 9.5  HGB 11.4*  HCT 35.6*  MCV 88.6  PLT 162   Basic Metabolic Panel: Recent Labs  Lab 01/16/24 1459  NA 140  K 3.1*  CL 108  CO2 22  GLUCOSE 141*  BUN 10  CREATININE 0.77  CALCIUM 8.2*   GFR: CrCl cannot be calculated (Unknown ideal weight.). Liver Function Tests: Recent Labs  Lab 01/16/24 1459  AST 15  ALT 7  ALKPHOS 36*  BILITOT 0.8  PROT 6.1*  ALBUMIN 3.2*   Urine analysis:    Component Value Date/Time   COLORURINE YELLOW (A) 07/06/2021 1805   APPEARANCEUR CLEAR (A) 07/06/2021 1805   LABSPEC 1.006  07/06/2021 1805   PHURINE 8.0 07/06/2021 1805   GLUCOSEU NEGATIVE 07/06/2021 1805   HGBUR NEGATIVE 07/06/2021 1805   BILIRUBINUR NEGATIVE 07/06/2021 1805   KETONESUR NEGATIVE 07/06/2021 1805   PROTEINUR 30 (A) 07/06/2021 1805   NITRITE NEGATIVE 07/06/2021 1805   LEUKOCYTESUR NEGATIVE 07/06/2021 1805   This document was prepared using Dragon Voice Recognition software and may include unintentional dictation errors.  Dr. Reinhold Carbine [facility]  If 7PM-7AM, please contact overnight-coverage provider If 7AM-7PM, please contact day attending provider [email or web address]  01/16/2024, 5:13 PM

## 2024-01-16 NOTE — Assessment & Plan Note (Addendum)
 Home alprazolam  0.25 mg p.o. nightly as needed for anxiety, sleep resumed on admission

## 2024-01-16 NOTE — ED Triage Notes (Signed)
 Pt to ED via [facility] from home for c/o fall, right hip pain. Pt has hx dementia.  BP 143/57 Cbg 173

## 2024-01-16 NOTE — Assessment & Plan Note (Signed)
 Check magnesium  level on admission Potassium supplementation with Polycitra 30 mill equivalent p.o. one-time dose ordered Recheck BMP in a.m.

## 2024-01-16 NOTE — Assessment & Plan Note (Signed)
 Home citalopram  10 mg daily, quetiapine  25 mg nightly were resumed on admission

## 2024-01-16 NOTE — Hospital Course (Addendum)
 Ms. Valerye Kobus is a 89 year old female with history of dementia, history of left thalamus lacunar infarct, hypertension, hypothyroid, GERD, who presents emergency department from home via EMS for chief concerns of a fall and complaints of right hip pain  Vitals in the ED showed temperature 97.8, respiration rate 19, heart rate 61, blood pressure 146/54, SpO2 97% on room air.  CBC reveals 140, potassium 3.1, chloride 108, bicarb 22, BUN of 10, serum creatinine 0.77, EGFR greater than 60, nonfasting blood glucose 141, WBC 9.5, hemoglobin 11.4, platelets of 162.  Alk phos was elevated at 36.  Albumin was 3.2.  ED treatment: Morphine  2 mg IV 2 doses were given, ondansetron  4 mg IV one-time dose.

## 2024-01-16 NOTE — Assessment & Plan Note (Addendum)
 10/31/2020: Estimated ejection fraction 65 to 70%, grade 1 diastolic dysfunction Not in acute exacerbation at this time

## 2024-01-16 NOTE — Progress Notes (Signed)
 Patient arrived on unit via stretcher. Patient alert to self and place; disoriented to time and situation that brought her here. Patient settled into room, skin assessment completed, telemetry placed as per orders. Patient stated that she was unaware of possible surgery tomorrow. Attempted to make patients next of kin aware that she was admitted. Calls placed to first two telephone numbers in patient chart that were labeled her son and daughter. Neither call was answered. Patient medicated for complaints of pain as per orders. Patient currently in bed resting. Will continue to monitor.

## 2024-01-16 NOTE — Assessment & Plan Note (Deleted)
 Orthopedic service **   ** Symptomatic support: Hydrocodone -acetaminophen  5-325 mg p.o. every 6 hours as needed for moderate pain, 2 days ordered; morphine  0.5 mg IV every 3 hours as needed for severe pain, 1 day ordered

## 2024-01-16 NOTE — Assessment & Plan Note (Signed)
 -  Levothyroxine 50 mcg daily resumed

## 2024-01-16 NOTE — ED Notes (Signed)
 Pt to XR

## 2024-01-16 NOTE — Assessment & Plan Note (Addendum)
 Home amlodipine  10 mg daily, atenolol  50 mg p.o. twice daily, hydralazine  25 mg twice daily resumed on admission Hydralazine  5 mg IV every 6 hours as needed for SBP greater 165, 5 days ordered

## 2024-01-16 NOTE — ED Notes (Signed)
 This RN called lab to add on Mag tube.

## 2024-01-16 NOTE — Assessment & Plan Note (Addendum)
 Orthopedic service has been consulted by EDP, Dr. Daun Epstein is aware Symptomatic support: Hydrocodone -acetaminophen  5-325 mg p.o. every 6 hours as needed for moderate pain, 2 days ordered; morphine  0.5 mg IV every 3 hours as needed for severe pain, 1 day ordered NPO after midnight in anticipation of OR tomorrow

## 2024-01-17 ENCOUNTER — Other Ambulatory Visit: Payer: Self-pay

## 2024-01-17 ENCOUNTER — Encounter: Payer: Self-pay | Admitting: Internal Medicine

## 2024-01-17 ENCOUNTER — Encounter
Admission: EM | Disposition: A | Discharge status: Skilled Nursing Facility | Payer: Self-pay | Source: Home / Self Care | Attending: Internal Medicine

## 2024-01-17 ENCOUNTER — Inpatient Hospital Stay: Admitting: Anesthesiology

## 2024-01-17 ENCOUNTER — Inpatient Hospital Stay

## 2024-01-17 DIAGNOSIS — E039 Hypothyroidism, unspecified: Secondary | ICD-10-CM

## 2024-01-17 DIAGNOSIS — I5032 Chronic diastolic (congestive) heart failure: Secondary | ICD-10-CM

## 2024-01-17 DIAGNOSIS — E876 Hypokalemia: Secondary | ICD-10-CM | POA: Diagnosis not present

## 2024-01-17 DIAGNOSIS — F331 Major depressive disorder, recurrent, moderate: Secondary | ICD-10-CM

## 2024-01-17 DIAGNOSIS — S72351A Displaced comminuted fracture of shaft of right femur, initial encounter for closed fracture: Secondary | ICD-10-CM | POA: Diagnosis not present

## 2024-01-17 DIAGNOSIS — F0150 Vascular dementia without behavioral disturbance: Secondary | ICD-10-CM

## 2024-01-17 DIAGNOSIS — S728X2A Other fracture of left femur, initial encounter for closed fracture: Secondary | ICD-10-CM | POA: Diagnosis not present

## 2024-01-17 HISTORY — PX: INTRAMEDULLARY (IM) NAIL INTERTROCHANTERIC: SHX5875

## 2024-01-17 LAB — BASIC METABOLIC PANEL WITH GFR
Anion gap: 10 (ref 5–15)
BUN: 16 mg/dL (ref 8–23)
CO2: 25 mmol/L (ref 22–32)
Calcium: 9.3 mg/dL (ref 8.9–10.3)
Chloride: 104 mmol/L (ref 98–111)
Creatinine, Ser: 1.01 mg/dL — ABNORMAL HIGH (ref 0.44–1.00)
GFR, Estimated: 52 mL/min — ABNORMAL LOW (ref >60)
Glucose, Bld: 125 mg/dL — ABNORMAL HIGH (ref 70–99)
Potassium: 4.7 mmol/L (ref 3.5–5.1)
Sodium: 139 mmol/L (ref 135–145)

## 2024-01-17 LAB — CBC
HCT: 33.1 % — ABNORMAL LOW (ref 36.0–46.0)
Hemoglobin: 10.7 g/dL — ABNORMAL LOW (ref 12.0–15.0)
MCH: 28.5 pg (ref 26.0–34.0)
MCHC: 32.3 g/dL (ref 30.0–36.0)
MCV: 88.3 fL (ref 80.0–100.0)
Platelets: 163 10*3/uL (ref 150–400)
RBC: 3.75 MIL/uL — ABNORMAL LOW (ref 3.87–5.11)
RDW: 15.2 % (ref 11.5–15.5)
WBC: 10.2 10*3/uL (ref 4.0–10.5)
nRBC: 0 % (ref 0.0–0.2)

## 2024-01-17 SURGERY — FIXATION, FRACTURE, INTERTROCHANTERIC, WITH INTRAMEDULLARY ROD
Anesthesia: General | Site: Hip | Laterality: Right

## 2024-01-17 MED ORDER — CEFAZOLIN SODIUM-DEXTROSE 2-4 GM/100ML-% IV SOLN
2.0000 g | Freq: Four times a day (QID) | INTRAVENOUS | Status: AC
  Administered 2024-01-17 (×2): 2 g via INTRAVENOUS
  Filled 2024-01-17 (×2): qty 100

## 2024-01-17 MED ORDER — FENTANYL CITRATE (PF) 100 MCG/2ML IJ SOLN: 25.0000 ug | INTRAMUSCULAR | Status: DC | PRN

## 2024-01-17 MED ORDER — ROCURONIUM BROMIDE 10 MG/ML (PF) SYRINGE
PREFILLED_SYRINGE | INTRAVENOUS | Status: AC
  Filled 2024-01-17: qty 10

## 2024-01-17 MED ORDER — FENTANYL CITRATE (PF) 100 MCG/2ML IJ SOLN
INTRAMUSCULAR | Status: DC | PRN
  Administered 2024-01-17: 25 ug via INTRAVENOUS
  Administered 2024-01-17: 50 ug via INTRAVENOUS

## 2024-01-17 MED ORDER — SODIUM CHLORIDE 0.9 % IV SOLN: INTRAVENOUS | Status: AC

## 2024-01-17 MED ORDER — DROPERIDOL 2.5 MG/ML IJ SOLN: 0.6250 mg | Freq: Once | INTRAMUSCULAR | Status: DC | PRN

## 2024-01-17 MED ORDER — 0.9 % SODIUM CHLORIDE (POUR BTL) OPTIME
TOPICAL | Status: DC | PRN
  Administered 2024-01-17: 1000 mL

## 2024-01-17 MED ORDER — ONDANSETRON HCL 4 MG/2ML IJ SOLN
4.0000 mg | Freq: Four times a day (QID) | INTRAMUSCULAR | Status: DC | PRN
  Filled 2024-01-17: qty 2

## 2024-01-17 MED ORDER — ROCURONIUM BROMIDE 100 MG/10ML IV SOLN
INTRAVENOUS | Status: DC | PRN
  Administered 2024-01-17: 40 mg via INTRAVENOUS

## 2024-01-17 MED ORDER — METOCLOPRAMIDE HCL 10 MG PO TABS: 5.0000 mg | ORAL_TABLET | Freq: Three times a day (TID) | ORAL | Status: DC | PRN

## 2024-01-17 MED ORDER — ACETAMINOPHEN 500 MG PO TABS
500.0000 mg | ORAL_TABLET | Freq: Four times a day (QID) | ORAL | Status: AC
  Administered 2024-01-17 – 2024-01-18 (×4): 500 mg via ORAL
  Filled 2024-01-17 (×4): qty 1

## 2024-01-17 MED ORDER — PROPOFOL 10 MG/ML IV BOLUS
INTRAVENOUS | Status: DC | PRN
  Administered 2024-01-17: 80 mg via INTRAVENOUS

## 2024-01-17 MED ORDER — TRANEXAMIC ACID-NACL 1000-0.7 MG/100ML-% IV SOLN
INTRAVENOUS | Status: AC
  Filled 2024-01-17: qty 100

## 2024-01-17 MED ORDER — EPHEDRINE SULFATE-NACL 50-0.9 MG/10ML-% IV SOSY
PREFILLED_SYRINGE | INTRAVENOUS | Status: DC | PRN
  Administered 2024-01-17 (×2): 10 mg via INTRAVENOUS

## 2024-01-17 MED ORDER — MAGNESIUM HYDROXIDE 400 MG/5ML PO SUSP
30.0000 mL | Freq: Every day | ORAL | Status: DC | PRN
  Administered 2024-01-25: 30 mL via ORAL
  Filled 2024-01-17: qty 30

## 2024-01-17 MED ORDER — DEXAMETHASONE SODIUM PHOSPHATE 10 MG/ML IJ SOLN
INTRAMUSCULAR | Status: AC
  Filled 2024-01-17: qty 1

## 2024-01-17 MED ORDER — ACETAMINOPHEN 10 MG/ML IV SOLN
INTRAVENOUS | Status: DC | PRN
  Administered 2024-01-17: 1000 mg via INTRAVENOUS

## 2024-01-17 MED ORDER — ENOXAPARIN SODIUM 40 MG/0.4ML IJ SOSY
40.0000 mg | PREFILLED_SYRINGE | INTRAMUSCULAR | Status: DC
  Administered 2024-01-18: 40 mg via SUBCUTANEOUS
  Filled 2024-01-17: qty 0.4

## 2024-01-17 MED ORDER — ACETAMINOPHEN 325 MG PO TABS
325.0000 mg | ORAL_TABLET | Freq: Four times a day (QID) | ORAL | Status: DC | PRN
  Administered 2024-01-19: 325 mg via ORAL
  Administered 2024-01-19 – 2024-01-20 (×3): 650 mg via ORAL
  Filled 2024-01-17 (×4): qty 2

## 2024-01-17 MED ORDER — LIDOCAINE HCL (PF) 2 % IJ SOLN
INTRAMUSCULAR | Status: AC
  Filled 2024-01-17: qty 5

## 2024-01-17 MED ORDER — BISACODYL 10 MG RE SUPP
10.0000 mg | Freq: Every day | RECTAL | Status: DC | PRN
  Administered 2024-01-19 – 2024-01-27 (×3): 10 mg via RECTAL
  Filled 2024-01-17 (×3): qty 1

## 2024-01-17 MED ORDER — ONDANSETRON HCL 4 MG/2ML IJ SOLN
INTRAMUSCULAR | Status: DC | PRN
  Administered 2024-01-17: 4 mg via INTRAVENOUS

## 2024-01-17 MED ORDER — DEXAMETHASONE SODIUM PHOSPHATE 10 MG/ML IJ SOLN
INTRAMUSCULAR | Status: DC | PRN
  Administered 2024-01-17: 6 mg via INTRAVENOUS

## 2024-01-17 MED ORDER — ADULT MULTIVITAMIN W/MINERALS CH
1.0000 | ORAL_TABLET | Freq: Every day | ORAL | Status: DC
  Administered 2024-01-18 – 2024-01-27 (×9): 1 via ORAL
  Filled 2024-01-17 (×10): qty 1

## 2024-01-17 MED ORDER — CEFAZOLIN SODIUM-DEXTROSE 2-4 GM/100ML-% IV SOLN
INTRAVENOUS | Status: AC
  Filled 2024-01-17: qty 100

## 2024-01-17 MED ORDER — BUPIVACAINE-EPINEPHRINE (PF) 0.5% -1:200000 IJ SOLN
INTRAMUSCULAR | Status: DC | PRN
  Administered 2024-01-17: 30 mL via INTRAMUSCULAR

## 2024-01-17 MED ORDER — ACETAMINOPHEN 10 MG/ML IV SOLN
INTRAVENOUS | Status: AC
  Filled 2024-01-17: qty 100

## 2024-01-17 MED ORDER — DOCUSATE SODIUM 100 MG PO CAPS
100.0000 mg | ORAL_CAPSULE | Freq: Two times a day (BID) | ORAL | Status: DC
  Administered 2024-01-17: 100 mg via ORAL
  Filled 2024-01-17: qty 1

## 2024-01-17 MED ORDER — METOCLOPRAMIDE HCL 5 MG/ML IJ SOLN: 5.0000 mg | Freq: Three times a day (TID) | INTRAMUSCULAR | Status: DC | PRN

## 2024-01-17 MED ORDER — PROPOFOL 10 MG/ML IV BOLUS
INTRAVENOUS | Status: AC
  Filled 2024-01-17: qty 20

## 2024-01-17 MED ORDER — FENTANYL CITRATE (PF) 100 MCG/2ML IJ SOLN
INTRAMUSCULAR | Status: AC
  Filled 2024-01-17: qty 2

## 2024-01-17 MED ORDER — SUGAMMADEX SODIUM 200 MG/2ML IV SOLN
INTRAVENOUS | Status: DC | PRN
  Administered 2024-01-17: 200 mg via INTRAVENOUS

## 2024-01-17 MED ORDER — ONDANSETRON HCL 4 MG/2ML IJ SOLN
INTRAMUSCULAR | Status: AC
  Filled 2024-01-17: qty 2

## 2024-01-17 MED ORDER — EPHEDRINE 5 MG/ML INJ
INTRAVENOUS | Status: AC
  Filled 2024-01-17: qty 5

## 2024-01-17 MED ORDER — FLEET ENEMA RE ENEM: 1.0000 | ENEMA | Freq: Once | RECTAL | Status: DC | PRN

## 2024-01-17 MED ORDER — LIDOCAINE HCL (CARDIAC) PF 100 MG/5ML IV SOSY
PREFILLED_SYRINGE | INTRAVENOUS | Status: DC | PRN
  Administered 2024-01-17: 70 mg via INTRAVENOUS

## 2024-01-17 MED ORDER — DIPHENHYDRAMINE HCL 12.5 MG/5ML PO ELIX: 12.5000 mg | ORAL_SOLUTION | ORAL | Status: DC | PRN

## 2024-01-17 MED ORDER — TRANEXAMIC ACID-NACL 1000-0.7 MG/100ML-% IV SOLN
INTRAVENOUS | Status: DC | PRN
  Administered 2024-01-17: 1000 mg via INTRAVENOUS

## 2024-01-17 MED ORDER — ONDANSETRON HCL 4 MG PO TABS
4.0000 mg | ORAL_TABLET | Freq: Four times a day (QID) | ORAL | Status: DC | PRN
Start: 2024-01-17 — End: 2024-01-27

## 2024-01-17 SURGICAL SUPPLY — 45 items
BIT DRILL 4.3MMS DISTAL GRDTED (BIT) IMPLANT
BNDG COHESIVE 4X5 TAN STRL LF (GAUZE/BANDAGES/DRESSINGS) ×1 IMPLANT
BNDG COHESIVE 6X5 TAN ST LF (GAUZE/BANDAGES/DRESSINGS) ×1 IMPLANT
CHLORAPREP W/TINT 26 (MISCELLANEOUS) ×2 IMPLANT
CORTICAL BONE SCR 5.0MM X 48MM (Screw) ×1 IMPLANT
DRAPE C-ARMOR (DRAPES) ×1 IMPLANT
DRAPE SHEET LG 3/4 BI-LAMINATE (DRAPES) ×1 IMPLANT
DRILL 4.3MMS DISTAL GRADUATED (BIT) ×1 IMPLANT
DRSG MEPILEX SACRM 8.7X9.8 (GAUZE/BANDAGES/DRESSINGS) ×1 IMPLANT
DRSG OPSITE POSTOP 3X4 (GAUZE/BANDAGES/DRESSINGS) IMPLANT
DRSG OPSITE POSTOP 4X6 (GAUZE/BANDAGES/DRESSINGS) IMPLANT
ELECT CAUTERY BLADE 6.4 (BLADE) ×1 IMPLANT
ELECT REM PT RETURN 9FT ADLT (ELECTROSURGICAL) ×1 IMPLANT
ELECTRODE REM PT RTRN 9FT ADLT (ELECTROSURGICAL) ×1 IMPLANT
GAUZE SPONGE 4X4 12PLY STRL (GAUZE/BANDAGES/DRESSINGS) ×1 IMPLANT
GLOVE BIO SURGEON STRL SZ8 (GLOVE) ×2 IMPLANT
GLOVE INDICATOR 8.0 STRL GRN (GLOVE) ×1 IMPLANT
GOWN STRL REUS W/ TWL LRG LVL3 (GOWN DISPOSABLE) ×1 IMPLANT
GOWN STRL REUS W/ TWL XL LVL3 (GOWN DISPOSABLE) ×1 IMPLANT
GUIDEPIN VERSANAIL DSP 3.2X444 (ORTHOPEDIC DISPOSABLE SUPPLIES) IMPLANT
GUIDEWIRE BALL NOSE 80CM (WIRE) IMPLANT
HANDLE YANKAUER SUCT OPEN TIP (MISCELLANEOUS) ×1 IMPLANT
MANIFOLD NEPTUNE II (INSTRUMENTS) ×1 IMPLANT
MAT ABSORB FLUID 56X50 GRAY (MISCELLANEOUS) ×1 IMPLANT
NAIL HIP FRACTURE 11X380MM (Nail) IMPLANT
NDL FILTER BLUNT 18X1 1/2 (NEEDLE) ×1 IMPLANT
NDL HYPO 22X1.5 SAFETY MO (MISCELLANEOUS) ×1 IMPLANT
NEEDLE FILTER BLUNT 18X1 1/2 (NEEDLE) IMPLANT
NEEDLE HYPO 22X1.5 SAFETY MO (MISCELLANEOUS) ×1 IMPLANT
NS IRRIG 500ML POUR BTL (IV SOLUTION) ×1 IMPLANT
PACK HIP COMPR (MISCELLANEOUS) ×1 IMPLANT
PENCIL SMOKE EVACUATOR (MISCELLANEOUS) ×1 IMPLANT
SCREW BONE CORTICAL 5.0X44 (Screw) IMPLANT
SCREW CORTICL BON 5.0MM X 48MM (Screw) IMPLANT
SCREW LAG HIP NAIL 10.5X95 (Screw) IMPLANT
STAPLER SKIN PROX 35W (STAPLE) ×1 IMPLANT
STRAP SAFETY 5IN WIDE (MISCELLANEOUS) ×1 IMPLANT
SUT VIC AB 0 CT1 36 (SUTURE) ×1 IMPLANT
SUT VIC AB 1 CT1 36 (SUTURE) ×1 IMPLANT
SUT VIC AB 2-0 CT1 (SUTURE) ×2 IMPLANT
SYR 10ML LL (SYRINGE) ×1 IMPLANT
SYR 30ML LL (SYRINGE) ×1 IMPLANT
TAPE MICROFOAM 4IN (TAPE) ×1 IMPLANT
TRAP FLUID SMOKE EVACUATOR (MISCELLANEOUS) ×1 IMPLANT
WATER STERILE IRR 500ML POUR (IV SOLUTION) ×1 IMPLANT

## 2024-01-17 NOTE — Anesthesia Preprocedure Evaluation (Signed)
 Anesthesia Evaluation  Patient identified by MRN, date of birth, ID band Patient awake    Reviewed: Allergy & Precautions, H&P , NPO status , Patient's Chart, lab work & pertinent test results, reviewed documented beta blocker date and time   History of Anesthesia Complications Negative for: history of anesthetic complications  Airway Mallampati: II  TM Distance: >3 FB Neck ROM: full    Dental  (+) Dental Advidsory Given   Pulmonary neg pulmonary ROS   Pulmonary exam normal breath sounds clear to auscultation       Cardiovascular Exercise Tolerance: Good hypertension, (-) angina (-) Cardiac Stents Normal cardiovascular exam(-) dysrhythmias (-) Valvular Problems/Murmurs Rhythm:regular Rate:Normal     Neuro/Psych  PSYCHIATRIC DISORDERS Anxiety Depression   Dementia negative neurological ROS     GI/Hepatic Neg liver ROS,GERD  ,,  Endo/Other  neg diabetesHypothyroidism    Renal/GU negative Renal ROS  negative genitourinary   Musculoskeletal   Abdominal   Peds  Hematology negative hematology ROS (+)   Anesthesia Other Findings Past Medical History: No date: Hypertension No date: Thyroid  disease   Reproductive/Obstetrics negative OB ROS                             Anesthesia Physical Anesthesia Plan  ASA: 3  Anesthesia Plan: General   Post-op Pain Management:    Induction: Intravenous  PONV Risk Score and Plan: 3 and Ondansetron , Dexamethasone  and Treatment may vary due to age or medical condition  Airway Management Planned: Oral ETT  Additional Equipment:   Intra-op Plan:   Post-operative Plan: Extubation in OR  Informed Consent: I have reviewed the patients History and Physical, chart, labs and discussed the procedure including the risks, benefits and alternatives for the proposed anesthesia with the patient or authorized representative who has indicated his/her understanding  and acceptance.     Dental Advisory Given  Plan Discussed with: Anesthesiologist, CRNA and Surgeon  Anesthesia Plan Comments:        Anesthesia Quick Evaluation

## 2024-01-17 NOTE — Progress Notes (Signed)
 Initial Nutrition Assessment  INTERVENTION:   -Ensure Plus High Protein po BID, each supplement provides 350 kcal and 20 grams of protein.   -Multivitamin with minerals daily  NUTRITION DIAGNOSIS:   Increased nutrient needs related to hip fracture, post-op healing as evidenced by estimated needs.  GOAL:   Patient will meet greater than or equal to 90% of their needs  MONITOR:   Diet advancement  REASON FOR ASSESSMENT:   Consult Hip fracture protocol  ASSESSMENT:   89 y.o. female with a history of dementia, a history of left thalamus lacunar infarct, hypertension, hypothyroidism, and gastroesophageal reflux disease who normally lives alone, but is closely attended to by family living close by as well as by aides who come to the house daily.  Apparently, the patient was in her usual state of health yesterday afternoon when she lost her balance and fell, landing on her right hip.  She was brought to the emergency room where x-rays demonstrated a displaced comminuted intertrochanteric fracture of the right hip.  Patient in OR for surgery of right hip. Per chart review, pt with Alzheimer's dementia. History of severe malnutrition. Ensure has already been ordered. Will also add daily MVI to support post-op healing.  Per weight records, no significant weight changes noted.  Medications: Colace  Labs reviewed.  NUTRITION - FOCUSED PHYSICAL EXAM:  Unable to complete, working remotely.  Diet Order:   Diet Order             Diet NPO time specified  Diet effective midnight                   EDUCATION NEEDS:   Not appropriate for education at this time  Skin:  Skin Assessment: Reviewed RN Assessment  Last BM:  PTA  Height:   Ht Readings from Last 1 Encounters:  01/16/24 5\' 7"  (1.702 m)    Weight:   Wt Readings from Last 1 Encounters:  01/16/24 66.3 kg    BMI:  Body mass index is 22.89 kg/m.  Estimated Nutritional Needs:   Kcal:  1600-1800  Protein:   75-85g  Fluid:  1.6L/day   Arna Better, MS, RD, LDN Inpatient Clinical Dietitian Contact via Secure chat

## 2024-01-17 NOTE — Anesthesia Procedure Notes (Signed)
 Procedure Name: Intubation Date/Time: 01/17/2024 10:18 AM  Performed by: Mertie Abt, CRNAPre-anesthesia Checklist: Patient identified, Patient being monitored, Timeout performed, Emergency Drugs available and Suction available Patient Re-evaluated:Patient Re-evaluated prior to induction Oxygen Delivery Method: Circle system utilized Preoxygenation: Pre-oxygenation with 100% oxygen Induction Type: IV induction Ventilation: Mask ventilation without difficulty Laryngoscope Size: 3 and McGrath Grade View: Grade I Tube type: Oral Tube size: 7.0 mm Number of attempts: 1 Airway Equipment and Method: Stylet and Video-laryngoscopy Placement Confirmation: ETT inserted through vocal cords under direct vision, positive ETCO2 and breath sounds checked- equal and bilateral Secured at: 21 cm Tube secured with: Tape Dental Injury: Teeth and Oropharynx as per pre-operative assessment

## 2024-01-17 NOTE — Progress Notes (Signed)
 Progress Note   Patient: Nicole Ramsey ZOX:[id number] DOB: 1926/10/03 DOA: 01/16/2024     1 DOS: the patient was seen and examined on 01/17/2024   Brief hospital course: Ms. Nicole Ramsey is a 89 year old female with history of dementia, history of left thalamus lacunar infarct, hypertension, hypothyroid, GERD, who presents emergency department from home via EMS for chief concerns of a fall and complaints of right hip pain  Patient is admitted for ortho evaluation of displaced comminuted fracture right femur.  Assessment and Plan: * Displaced comminuted fracture of shaft of right femur, initial encounter for closed fracture [facility]) Orthopedic service evaluation with Dr. Daun Epstein appreciated. S/p Reduction and internal fixation of displaced intertrochanteric right hip fracture with Bi[employer] Affixis TFN nail 01/17/24. Continue pan control. PT/ OT evaluation for discharge planning. Out of bed to chair.  Heart failure with preserved ejection fraction (HCC) 10/31/2020: Estimated ejection fraction 65 to 70%, grade 1 diastolic dysfunction Not in acute exacerbation at this time.  Hypokalemia Mag normal. K improved with supplements.  Anxiety Home alprazolam  0.25 mg p.o. nightly as needed for anxiety, sleep ordered.  Hypertension Home amlodipine  10 mg daily, atenolol  50 mg p.o. twice daily, hydralazine  25 mg twice daily resumed on admission Continue to monitor BP. Check orthostatic vitals, she did feel dizzy.  Hypothyroid Levothyroxine  50 mcg daily ordered.  Depression, major, recurrent, moderate (HCC) Home citalopram  10 mg daily, quetiapine  25 mg nightly were resumed on admission      Out of bed to chair. Incentive spirometry. Nursing supportive care. Fall, aspiration precautions. Diet:  Diet Orders (From admission, onward)     Start     Ordered   01/17/24 1330  Diet clear liquid Room service appropriate? Yes; Fluid consistency: Thin  Diet effective now       Question Answer  Comment  Room service appropriate? Yes   Fluid consistency: Thin      01/17/24 1329           DVT prophylaxis: enoxaparin  (LOVENOX ) injection 40 mg Start: 01/18/24 0800 SCDs Start: 01/17/24 1305 Place TED hose Start: 01/17/24 1305  Level of care: Telemetry Medical   Code Status: Limited: Do not attempt resuscitation (DNR) -DNR-LIMITED -Do Not Intubate/DNI   Subjective: Patient is seen and examined today morning. She feels dizzy all the time mostly with change in position. No complains of pain.  Physical Exam: Vitals:   01/17/24 1215 01/17/24 1230 01/17/24 1245 01/17/24 1320  BP: (!) 125/55 (!) 124/54 (!) 122/47 (!) 116/48  Pulse: 62 72 63 60  Resp: 17 12 13 12   Temp: (!) 97.2 F (36.2 C)  98.3 F (36.8 C) 97.8 F (36.6 C)  TempSrc:    Axillary  SpO2: 100% 94% 94% 90%  Weight:      Height:        General - Elderly African American female, no apparent distress HEENT - PERRLA, EOMI, atraumatic head, non tender sinuses. Lung - Clear, no rales, rhonchi, wheezes. Heart - S1, S2 heard, no murmurs, rubs, trace pedal edema. Abdomen - Soft, non tender, bowel sounds good Neuro - Alert, awake and oriented x 3, non focal exam. Skin - Warm and dry. Right hip sutures note.  Data Reviewed:      Latest Ref Rng & Units 01/17/2024    4:36 AM 01/16/2024    2:59 PM 07/08/2021    5:12 AM  CBC  WBC 4.0 - 10.5 K/uL 10.2  9.5  7.6   Hemoglobin 12.0 - 15.0 g/dL 10.7  11.4  13.0   Hematocrit 36.0 - 46.0 % 33.1  35.6  38.5   Platelets 150 - 400 K/uL 163  162  219       Latest Ref Rng & Units 01/17/2024    4:36 AM 01/16/2024    2:59 PM 07/09/2021    9:19 AM  BMP  Glucose 70 - 99 mg/dL 308  657  846   BUN 8 - 23 mg/dL 16  10  16    Creatinine 0.44 - 1.00 mg/dL 9.62  9.52  8.41   Sodium 135 - 145 mmol/L 139  140  127   Potassium 3.5 - 5.1 mmol/L 4.7  3.1  4.1   Chloride 98 - 111 mmol/L 104  108  93   CO2 22 - 32 mmol/L 25  22  28    Calcium 8.9 - 10.3 mg/dL 9.3  8.2  9.5    DG  C-Arm 1-60 Min-No Report Result Date: 01/17/2024 Fluoroscopy was utilized by the requesting physician.  No radiographic interpretation.   CT HEAD WO CONTRAST ( ) Result Date: 01/16/2024 CLINICAL DATA:  Head trauma, minor. EXAM: CT HEAD WITHOUT CONTRAST TECHNIQUE: Contiguous axial images were obtained from the base of the skull through the vertex without intravenous contrast. RADIATION DOSE REDUCTION: This exam was performed according to the departmental dose-optimization program which includes automated exposure control, adjustment of the mA and/or kV according to patient size and/or use of iterative reconstruction technique. COMPARISON:  Head CT 07/07/2021.  MRI brain 07/06/2021. FINDINGS: Brain: No acute hemorrhage. Stable background of moderate chronic small-vessel disease and old lacunar infarct in the left thalamus. Cortical gray-white differentiation is otherwise preserved. Prominence of the ventricles and sulci within expected range for age. No hydrocephalus or extra-axial collection. No mass effect or midline shift. Vascular: No hyperdense vessel or unexpected calcification. Skull: No calvarial fracture or suspicious bone lesion. Skull base is unremarkable. Sinuses/Orbits: No acute findings. Other: None. IMPRESSION: 1. No acute intracranial abnormality. 2. Stable background of moderate chronic small-vessel disease and old lacunar infarct in the left thalamus. Electronically Signed   By: Audra Blend M.D.   On: 01/16/2024 15:33   DG Chest 1 View Result Date: 01/16/2024 CLINICAL DATA:  Fall. EXAM: CHEST  1 VIEW COMPARISON:  12/08/2022. FINDINGS: Low lung volume. Mild prominence of interstitial markings is nonspecific and may be secondary to underlying age-related fibrosis/scarring. Bilateral lung fields are otherwise clear. No dense consolidation or lung collapse. Bilateral costophrenic angles are clear. Normal cardio-mediastinal silhouette. No acute osseous abnormalities. The soft tissues are  within normal limits. IMPRESSION: No active disease. Electronically Signed   By: Beula Brunswick M.D.   On: 01/16/2024 14:35   DG HIP UNILAT W OR W/O PELVIS 2-3 VIEWS RIGHT Result Date: 01/16/2024 CLINICAL DATA:  Fall.  Right hip pain. EXAM: DG HIP (WITH OR WITHOUT PELVIS) 2-3V RIGHT COMPARISON:  None Available. FINDINGS: There is comminuted and displaced/angulated fracture of the proximal right femur. No other acute fracture or dislocation. No aggressive osseous lesion. Visualized sacral arcuate lines are unremarkable. Unremarkable symphysis pubis. There are mild-to-moderate degenerative changes of bilateral hip joints characterized by joint space narrowing and osteophytosis of the superior acetabulum. No radiopaque foreign bodies. IMPRESSION: *Comminuted and displaced/angulated fracture of the proximal right femur. Electronically Signed   By: Beula Brunswick M.D.   On: 01/16/2024 14:34    Family Communication: Discussed with patient, she understand and agree. All questions answered.  Disposition: Status is: Inpatient Remains inpatient appropriate because: post ortho procedure, PT  eval  Planned Discharge Destination: Home with Home Health     Time spent: 40 minutes  Author: Aisha Hove, MD 01/17/2024 3:30 PM Secure chat 7am to 7pm For on call review [email or web address].

## 2024-01-17 NOTE — Consult Note (Signed)
 ORTHOPAEDIC CONSULTATION  REQUESTING PHYSICIAN: Aisha Hove, MD  Chief Complaint:   Right hip pain  History of Present Illness: Nicole Ramsey is a 89 y.o. female with a history of dementia, a history of left thalamus lacunar infarct, hypertension, hypothyroidism, and gastroesophageal reflux disease who normally lives alone, but is closely attended to by family living close by as well as by aides who come to the house daily.  Apparently, the patient was in her usual state of health yesterday afternoon when she lost her balance and fell, landing on her right hip.  She was brought to the emergency room where x-rays demonstrated a displaced comminuted intertrochanteric fracture of the right hip.  The patient denies any associated injuries.  She did not strike her head or lose consciousness.  The patient also denies any lightheadedness, dizziness, chest pain, shortness of breath, or other symptoms which may have precipitated her fall, although she is not a great historian.  Past Medical History:  Diagnosis Date   Hypertension    Thyroid  disease    History reviewed. No pertinent surgical history. Social History   Socioeconomic History   Marital status: Married    Spouse name: Not on file   Number of children: Not on file   Years of education: Not on file   Highest education level: Not on file  Occupational History   Not on file  Tobacco Use   Smoking status: Never   Smokeless tobacco: Never  Substance and Sexual Activity   Alcohol use: Not Currently   Drug use: Never   Sexual activity: Not Currently  Other Topics Concern   Not on file  Social History Narrative   Not on file   Social Drivers of Health   Financial Resource Strain: Low Risk  (01/09/2024)   Received from [facility]   Overall Financial Resource Strain (CARDIA)    Difficulty of Paying Living Expenses: Not hard at all  Food  Insecurity: Patient Unable To Answer (01/16/2024)   Hunger Vital Sign    Worried About Running Out of Food in the Last Year: Patient unable to answer    Ran Out of Food in the Last Year: Patient unable to answer  Transportation Needs: Patient Unable To Answer (01/16/2024)   PRAPARE - Transportation    Lack of Transportation (Medical): Patient unable to answer    Lack of Transportation (Non-Medical): Patient unable to answer  Physical Activity: Not on file  Stress: Not on file  Social Connections: Patient Unable To Answer (01/16/2024)   Social Connection and Isolation Panel [NHANES]    Frequency of Communication with Friends and Family: Patient unable to answer    Frequency of Social Gatherings with Friends and Family: Patient unable to answer    Attends Religious Services: Patient unable to answer    Active Member of Clubs or Organizations: Patient unable to answer    Attends Banker Meetings: Patient unable to answer    Marital Status: Patient unable to answer   Family History  Problem Relation Age of Onset   Stroke Mother    Stroke Sister    Allergies  Allergen Reactions   Aspirin Other (See Comments)    Elevated blood pressure   Prior to Admission medications   Medication Sig Start Date End Date Taking? Authorizing Provider  ALPRAZolam  (XANAX ) 0.25 MG tablet Take 0.25 mg by mouth at bedtime as needed for anxiety or sleep. 10/23/23  Yes [provider]  amLODipine  (NORVASC ) 10 MG tablet  Take 10 mg by mouth daily. 09/12/20  Yes [provider]  atenolol  (TENORMIN ) 50 MG tablet Take 1 tablet (50 mg total) by mouth 2 (two) times daily. 04/27/21 01/16/24 Yes Bradler, Evan K, MD  citalopram  (CELEXA ) 10 MG tablet Take 10 mg by mouth daily.   Yes [provider]  hydrALAZINE  (APRESOLINE ) 25 MG tablet Take 25 mg by mouth 2 (two) times daily.   Yes [provider]  levothyroxine  (SYNTHROID ) 50 MCG tablet Take 50 mcg by mouth daily. 10/11/20  Yes  [provider]  meclizine  (ANTIVERT ) 25 MG tablet Take 25 mg by mouth 3 (three) times daily as needed for dizziness.   Yes [provider]  pantoprazole  (PROTONIX ) 40 MG tablet Take 1 tablet (40 mg total) by mouth 2 (two) times daily. 07/10/21  Yes Swayze, Ava, DO  QUEtiapine  (SEROQUEL ) 25 MG tablet Take 25 mg by mouth at bedtime. 10/11/20  Yes [provider]  Vitamin D, Ergocalciferol, (DRISDOL) 1.25 MG (50000 UNIT) CAPS capsule Take 50,000 Units by mouth once a week. 05/18/20  Yes [provider]  Bacillus Coagulans-Inulin (PROBIOTIC) 1-250 BILLION-MG CAPS Take 1 capsule by mouth daily. Patient not taking: Reported on 01/16/2024 06/26/21   Isa Manuel, MD  cyanocobalamin (,VITAMIN B-12,) 1000 MCG/ML injection Inject 1,000 mcg into the muscle every 30 (thirty) days. Patient not taking: Reported on 01/16/2024    [provider]  feeding supplement (ENSURE ENLIVE / ENSURE PLUS) LIQD Take 237 mLs by mouth 2 (two) times daily between meals. 11/10/20   Zhang, Dekui, MD  polyethylene glycol (MIRALAX  / GLYCOLAX ) 17 g packet Take 17 g by mouth daily. Patient not taking: Reported on 01/16/2024 07/11/21   Swayze, Ava, DO   CT HEAD WO CONTRAST ( ) Result Date: 01/16/2024 CLINICAL DATA:  Head trauma, minor. EXAM: CT HEAD WITHOUT CONTRAST TECHNIQUE: Contiguous axial images were obtained from the base of the skull through the vertex without intravenous contrast. RADIATION DOSE REDUCTION: This exam was performed according to the departmental dose-optimization program which includes automated exposure control, adjustment of the mA and/or kV according to patient size and/or use of iterative reconstruction technique. COMPARISON:  Head CT 07/07/2021.  MRI brain 07/06/2021. FINDINGS: Brain: No acute hemorrhage. Stable background of moderate chronic small-vessel disease and old lacunar infarct in the left thalamus. Cortical gray-white differentiation is otherwise preserved.  Prominence of the ventricles and sulci within expected range for age. No hydrocephalus or extra-axial collection. No mass effect or midline shift. Vascular: No hyperdense vessel or unexpected calcification. Skull: No calvarial fracture or suspicious bone lesion. Skull base is unremarkable. Sinuses/Orbits: No acute findings. Other: None. IMPRESSION: 1. No acute intracranial abnormality. 2. Stable background of moderate chronic small-vessel disease and old lacunar infarct in the left thalamus. Electronically Signed   By: Audra Blend M.D.   On: 01/16/2024 15:33   DG Chest 1 View Result Date: 01/16/2024 CLINICAL DATA:  Fall. EXAM: CHEST  1 VIEW COMPARISON:  12/08/2022. FINDINGS: Low lung volume. Mild prominence of interstitial markings is nonspecific and may be secondary to underlying age-related fibrosis/scarring. Bilateral lung fields are otherwise clear. No dense consolidation or lung collapse. Bilateral costophrenic angles are clear. Normal cardio-mediastinal silhouette. No acute osseous abnormalities. The soft tissues are within normal limits. IMPRESSION: No active disease. Electronically Signed   By: Beula Brunswick M.D.   On: 01/16/2024 14:35   DG HIP UNILAT W OR W/O PELVIS 2-3 VIEWS RIGHT Result Date: 01/16/2024 CLINICAL DATA:  Fall.  Right hip pain. EXAM:  DG HIP (WITH OR WITHOUT PELVIS) 2-3V RIGHT COMPARISON:  None Available. FINDINGS: There is comminuted and displaced/angulated fracture of the proximal right femur. No other acute fracture or dislocation. No aggressive osseous lesion. Visualized sacral arcuate lines are unremarkable. Unremarkable symphysis pubis. There are mild-to-moderate degenerative changes of bilateral hip joints characterized by joint space narrowing and osteophytosis of the superior acetabulum. No radiopaque foreign bodies. IMPRESSION: *Comminuted and displaced/angulated fracture of the proximal right femur. Electronically Signed   By: Beula Brunswick M.D.   On: 01/16/2024 14:34     Positive ROS: All other systems have been reviewed and were otherwise negative with the exception of those mentioned in the HPI and as above.  Physical Exam: General:  Alert, no acute distress Psychiatric:  Patient is not competent for consent, but exhibits normal mood and affect   Cardiovascular:  No pedal edema Respiratory:  No wheezing, non-labored breathing GI:  Abdomen is soft and non-tender Skin:  No lesions in the area of chief complaint Neurologic:  Sensation intact distally Lymphatic:  No axillary or cervical lymphadenopathy  Orthopedic Exam:  Orthopedic examination is limited to the right hip and lower extremity.  The right lower extremity is shortened and externally rotated as compared to the left lower extremity.  Skin inspection around the right hip is unremarkable.  No swelling, erythema, ecchymosis, abrasions, or other skin abnormalities are identified.  She has mild tenderness to palpation over the lateral aspect of the right hip.  She has more severe pain with any attempted active or passive motion of the hip.  She is grossly neurovascularly intact to the right lower extremity and foot.  X-rays:  X-rays of the pelvis and right hip are available for review and have been reviewed by myself.  The findings are as described above.  There is a comminuted four-part intertrochanteric fracture of the right hip.  Mild degenerative changes of the right hip joint also are noted.  She exhibits moderate osteopenic changes throughout the visualized bony structures.  Assessment: Closed displaced four-part intertrochanteric fracture right hip.  Plan: The treatment options, including both surgical and nonsurgical choices, have been discussed in detail with the patient and her son, Reymundo Caulk, by phone.  The son, on the patient's behalf, is in agreement with proceeding with surgical intervention, specifically an intramedullary nailing of the displaced intertrochanteric fracture of her right hip.   The risks (including bleeding, infection, nerve and/or blood vessel injury, persistent or recurrent pain, loosening or failure of the components, malunion and/or nonunion, need for further surgery, blood clots, strokes, heart attacks or arrhythmias, pneumonia, etc.) and benefits of the surgical procedure were discussed.  The patient and her son state they are understanding and agree to proceed.  A formal written consent will be obtained by the nursing staff.  Thank you for asking me to participate in the care of this most pleasant yet unfortunate woman.  I will be happy to follow her with you.   Lonnie Roberts, MD  Beeper #:  [phone]  01/17/2024 7:19 AM

## 2024-01-17 NOTE — Progress Notes (Signed)
 Patient taken to OR.

## 2024-01-17 NOTE — Plan of Care (Signed)

## 2024-01-17 NOTE — Progress Notes (Signed)
 OT Cancellation Note  Patient Details Name: Nicole Ramsey MRN: [id number] DOB: 1926-11-12   Cancelled Treatment:    Reason Eval/Treat Not Completed: Active bedrest order. Chart reviewed - pt with active bed rest orders and noted plan for OR this date. Per therapy protocols, will sign off and require new orders to initiate therapy services as pt appropriate.   Gordan Latina, M.S. OTR/L  01/17/24, 8:25 AM  ascom [phone]

## 2024-01-17 NOTE — Progress Notes (Signed)
   CROSS COVER NOTE  Patient name: Nicole Ramsey MRN: [id number] DOB : [birth year]  Concern as stated by nurse / staff   Ms. Nicole Ramsey is a 89 year old female with history of dementia, history of left thalamus lacunar infarct, hypertension, hypothyroid, GERD, who presents emergency department from home via EMS for chief concerns of a fall and complaints of right hip pain.   4 mins She had sx today. She has been trending on the lower side of BP especially diastolic BP. Her 8 pm vitals look good but I am concerned giving her hydralazine  and atenolol  might drop her diastolic BP.  She is on fluids 75ml NS per hour.  SH I just wanted to consult you before giving her blood pressure medications given that she has hypotensive hx.    Review of Pertinent findings: Patient consistently having low diastolics throughout the day while continuing on IV fluids post-op and HR in 60s.  She is asymptomatic and stable but concern that her scheduled evening meds may cause hypotension, bradycardia in setting of recent surgery and advanced age receiving anesthetic and blood loss.   Assessment and  Interventions   Assessment: currently stable but at risk of hypotension with scheduled medications  Plan: Hold evening atenolol  dose and continue to monitor HR.  Discontinued hydralazine  for continued low diastolic BPs. May restart as needed.  She continues on m IVF so will monitor vitals with medication changes

## 2024-01-17 NOTE — Progress Notes (Signed)
 PT Cancellation Note  Patient Details Name: Nicole Ramsey MRN: [id number] DOB: 01-Nov-1926   Cancelled Treatment:    Reason Eval/Treat Not Completed: Patient at procedure or test/unavailable.  PT consult received.  Chart reviewed.  Per chart pt with active bed rest orders and noted plan for OR today (therapy held at this time).  Will need new PT consult post op in order to initiate therapy services.  Amador Junes, PT 01/17/24, 9:59 AM

## 2024-01-17 NOTE — Plan of Care (Signed)
  Problem: Pain Managment: Goal: General experience of comfort will improve and/or be controlled Outcome: Progressing   Problem: Skin Integrity: Goal: Risk for impaired skin integrity will decrease Outcome: Progressing   Problem: Education: Goal: Knowledge of General Education information will improve Description: Including pain rating scale, medication(s)/side effects and non-pharmacologic comfort measures Outcome: Not Progressing   Problem: Clinical Measurements: Goal: Ability to maintain clinical measurements within normal limits will improve Outcome: Not Progressing   Problem: Activity: Goal: Risk for activity intolerance will decrease Outcome: Not Progressing   Problem: Nutrition: Goal: Adequate nutrition will be maintained Outcome: Not Progressing   Problem: Coping: Goal: Level of anxiety will decrease Outcome: Not Progressing   Problem: Elimination: Goal: Will not experience complications related to bowel motility Outcome: Not Progressing

## 2024-01-17 NOTE — Op Note (Signed)
 01/17/2024  11:50 AM  Patient:   Nicole Ramsey  Pre-Op Diagnosis:   Closed displaced four-part intertrochanteric fracture, right hip.  Post-Op Diagnosis:   Same  Procedure:   Reduction and internal fixation of displaced intertrochanteric right hip fracture with Biomet Affixis TFN nail.  Surgeon:   Lonnie Roberts, MD  Assistant:   None  Anesthesia:   Spinal  Findings:   As above  Complications:   None  EBL:   50 cc  Fluids:   600 cc crystalloid  UOP:   None  TT:   None  Drains:   None  Closure:   Staples  Implants:   Biomet Affixis 11 x 380 mm TFN with a 95 mm lag screw and 44 mm and 48 mm distal interlocking screws  Brief Clinical Note:   The patient is a 89 year old female who sustained the above-noted injury yesterday afternoon when she apparently fell in her home. She was brought to the emergency room where x-rays demonstrated the above-noted injury. The patient has been cleared medically and presents at this time for reduction and internal fixation of the displaced intertrochanteric right hip fracture.  Procedure:   The patient was brought into the operating room and lain in the supine position. After adequate general endotracheal intubation and anesthesia was obtained, the patient was repositioned on the fracture table. The uninjured leg was placed in a flexed and abducted position while the injured lower extremity was placed in longitudinal traction. The fracture was reduced using longitudinal traction and internal rotation. The adequacy of reduction was verified fluoroscopically in AP and lateral projections and found to be near anatomic. The lateral aspects of the right hip and thigh were prepped with ChloraPrep solution before being draped sterilely. Preoperative antibiotics were administered. A timeout was performed to verify the appropriate surgical site.   The greater trochanter was identified fluoroscopically and an approximately 3 cm incision made about 2-3  fingerbreadths above the tip of the greater trochanter. The incision was carried down through the subcutaneous tissues to expose the gluteal fascia. This was split the length of the incision, providing access to the tip of the trochanter. Under fluoroscopic guidance, a guidewire was drilled through the tip of the trochanter into the proximal metaphysis to the level of the lesser trochanter. After verifying its position fluoroscopically in AP and lateral projections, it was overreamed with the initial reamer to the depth of the lesser trochanter. A guidewire was passed down through the femoral canal to the supracondylar region. The adequacy of guidewire position was verified fluoroscopically in AP and lateral projections before the length of the guidewire within the canal was measured and found to be 395 mm. Therefore, a 380 mm length nail was selected. The guidewire was overreamed sequentially using the flexible reamers, beginning with a 10.5 mm reamer and progressing to a 12.5 mm reamer. This provided good cortical chatter. The 11 x 380 mm Biomet Affixis TFN rod was selected and advanced to the appropriate depth, as verified fluoroscopically.   The guide system for the lag screw was positioned and advanced through an approximately 2 cm stab incision over the lateral aspect of the proximal femur. The guidewire was drilled up through the trochanteric femoral nail and into the femoral neck to rest within 5 mm of subchondral bone. After verifying its position in the femoral neck and head in both AP and lateral projections, the guidewire was measured and found to be optimally replicated by a 95 mm lag screw. The  guidewire was overreamed to the appropriate depth before the lag screw was inserted and advanced to the appropriate depth as verified fluoroscopically in AP and lateral projections. The locking screw was advanced, then backed off a quarter turn to set the lag screw. Again the adequacy of hardware position and  fracture reduction was verified fluoroscopically in AP and lateral projections and found to be excellent.  Attention was directed distally. Using the "perfect circle" technique, the leg and fluoroscopy machine were positioned appropriately. An approximately 1.5 cm stab incision was made over the skin at the appropriate point before the drill bit was advanced through the cortex and across the static hole of the nail. The appropriate length of the screw was determined before the 44 mm distal interlocking screw was positioned, then advanced and tightened securely.  Given the patient's age and bone quality, as well as the fact that the fracture was comminuted and extended below the lesser trochanter, is felt best to place a second distal interlocking screw. Therefore, again using the perfect circle technique, an approximate 1.5 cm stab incision was made over the skin at the appropriate point before the drill bit was advanced through the cortex and across the dynamic hole of the nail. The appropriate length of screw was determined before the 48 mm distal interlocking screw was positioned, then advanced and tightened securely. The adequacy of screw position was verified fluoroscopically in AP and lateral projections and found to be excellent.  The wounds were irrigated thoroughly with sterile saline solution before the abductor fascia was reapproximated using #1 Vicryl interrupted sutures. The subcutaneous tissues were closed using #0 and 2-0 Vicryl interrupted sutures. The skin was closed using staples. A solution of 20 cc of 0.5% Sensorcaine  with epinephrine  and 10 cc of Exparel  was injected in and around all incisions. Sterile occlusive dressings were applied to all wounds before the patient was transferred back to her hospital bed. The patient was then returned to the recovery room in satisfactory condition after tolerating the procedure well.

## 2024-01-17 NOTE — OR Nursing (Signed)
 Periwick removed in OR.

## 2024-01-17 NOTE — Transfer of Care (Signed)
 Immediate Anesthesia Transfer of Care Note  Patient: Nicole Ramsey  Procedure(s) Performed: FIXATION, FRACTURE, INTERTROCHANTERIC, WITH INTRAMEDULLARY ROD (Right: Hip)  Patient Location: PACU  Anesthesia Type:General  Level of Consciousness: drowsy  Airway & Oxygen Therapy: Patient Spontanous Breathing and Patient connected to face mask oxygen  Post-op Assessment: Report given to RN and Post -op Vital signs reviewed and stable  Post vital signs: Reviewed and stable  Last Vitals:  Vitals Value Taken Time  BP 118/43 01/17/24 1150  Temp    Pulse 64 01/17/24 1151  Resp 11 01/17/24 1151  SpO2 92 % 01/17/24 1151  Vitals shown include unfiled device data.  Last Pain:  Vitals:   01/17/24 0433  TempSrc:   PainSc: Asleep         Complications: No notable events documented.

## 2024-01-17 NOTE — Anesthesia Postprocedure Evaluation (Signed)
 Anesthesia Post Note  Patient: Nicole Ramsey  Procedure(s) Performed: FIXATION, FRACTURE, INTERTROCHANTERIC, WITH INTRAMEDULLARY ROD (Right: Hip)  Patient location during evaluation: PACU Anesthesia Type: General Level of consciousness: awake and alert Pain management: pain level controlled Vital Signs Assessment: post-procedure vital signs reviewed and stable Respiratory status: spontaneous breathing, nonlabored ventilation, respiratory function stable and patient connected to nasal cannula oxygen Cardiovascular status: blood pressure returned to baseline and stable Postop Assessment: no apparent nausea or vomiting Anesthetic complications: no   No notable events documented.   Last Vitals:  Vitals:   01/17/24 1320 01/17/24 1627  BP: (!) 116/48 (!) 106/50  Pulse: 60 65  Resp: 12 18  Temp: 36.6 C (!) 36.4 C  SpO2: 90% 98%    Last Pain:  Vitals:   01/17/24 1732  TempSrc:   PainSc: 2                  Vanice Genre

## 2024-01-18 DIAGNOSIS — D62 Acute posthemorrhagic anemia: Secondary | ICD-10-CM | POA: Insufficient documentation

## 2024-01-18 DIAGNOSIS — E039 Hypothyroidism, unspecified: Secondary | ICD-10-CM | POA: Diagnosis not present

## 2024-01-18 DIAGNOSIS — S728X2A Other fracture of left femur, initial encounter for closed fracture: Secondary | ICD-10-CM | POA: Diagnosis not present

## 2024-01-18 DIAGNOSIS — S72351A Displaced comminuted fracture of shaft of right femur, initial encounter for closed fracture: Secondary | ICD-10-CM | POA: Diagnosis not present

## 2024-01-18 DIAGNOSIS — E876 Hypokalemia: Secondary | ICD-10-CM | POA: Diagnosis not present

## 2024-01-18 DIAGNOSIS — R338 Other retention of urine: Secondary | ICD-10-CM | POA: Insufficient documentation

## 2024-01-18 LAB — BASIC METABOLIC PANEL WITH GFR
Anion gap: 6 (ref 5–15)
BUN: 21 mg/dL (ref 8–23)
CO2: 25 mmol/L (ref 22–32)
Calcium: 8.7 mg/dL — ABNORMAL LOW (ref 8.9–10.3)
Chloride: 104 mmol/L (ref 98–111)
Creatinine, Ser: 1.35 mg/dL — ABNORMAL HIGH (ref 0.44–1.00)
GFR, Estimated: 36 mL/min — ABNORMAL LOW (ref >60)
Glucose, Bld: 115 mg/dL — ABNORMAL HIGH (ref 70–99)
Potassium: 4.8 mmol/L (ref 3.5–5.1)
Sodium: 135 mmol/L (ref 135–145)

## 2024-01-18 LAB — CBC
HCT: 23.2 % — ABNORMAL LOW (ref 36.0–46.0)
Hemoglobin: 7.6 g/dL — ABNORMAL LOW (ref 12.0–15.0)
MCH: 29.3 pg (ref 26.0–34.0)
MCHC: 32.8 g/dL (ref 30.0–36.0)
MCV: 89.6 fL (ref 80.0–100.0)
Platelets: 125 10*3/uL — ABNORMAL LOW (ref 150–400)
RBC: 2.59 MIL/uL — ABNORMAL LOW (ref 3.87–5.11)
RDW: 15.5 % (ref 11.5–15.5)
WBC: 12.7 10*3/uL — ABNORMAL HIGH (ref 4.0–10.5)
nRBC: 0 % (ref 0.0–0.2)

## 2024-01-18 LAB — HEMOGLOBIN AND HEMATOCRIT, BLOOD
HCT: 22.2 % — ABNORMAL LOW (ref 36.0–46.0)
Hemoglobin: 7.3 g/dL — ABNORMAL LOW (ref 12.0–15.0)

## 2024-01-18 MED ORDER — ENOXAPARIN SODIUM 30 MG/0.3ML IJ SOSY: 30.0000 mg | PREFILLED_SYRINGE | INTRAMUSCULAR | Status: DC

## 2024-01-18 MED ORDER — POLYETHYLENE GLYCOL 3350 17 G PO PACK
17.0000 g | PACK | Freq: Two times a day (BID) | ORAL | Status: DC
  Administered 2024-01-18 – 2024-01-27 (×19): 17 g via ORAL
  Filled 2024-01-18 (×19): qty 1

## 2024-01-18 NOTE — Evaluation (Signed)
 Physical Therapy Evaluation Patient Details Name: Nicole Ramsey MRN: [id number] DOB: [birth year] Today's Date: 01/18/2024  History of Present Illness  Pt is a 89 y.o. female presenting to hospital 01/16/24 s/p fall; c/o R hip pain.  Imaging showing comminuted and displaced/angulated fracture of the proximal right  femur.  S/p reduction and internal fixation of displaced intertrochanteric right hip fracture with Biomet Affixis TFN nail 01/17/24. PMH includes htn, thyroid  disease, chronic hip pain, dementia, L thalamus lacunar infarct.  Clinical Impression  Prior to recent medical concerns, pt reports being ambulatory; per chart pt lives alone but has supportive family and aides that assist; no family present during session; pt oriented to first/last name and year of birthday only.  Currently pt is mod assist x2 with bed mobility and min to mod assist x2 to stand up to RW (x2 trials); pt noted with minimal WB'ing through R LE in standing and unable to take any steps with 2 assist, cueing, and use of walker.  Increased pain noted R hip/thigh with R LE movement and activities in general (nurse present during session to give pt pain medication).  Pt would currently benefit from skilled PT to address noted impairments and functional limitations (see below for any additional details).  Upon hospital discharge, pt would benefit from ongoing therapy.     If plan is discharge home, recommend the following: Two people to help with walking and/or transfers;A lot of help with bathing/dressing/bathroom;Assistance with cooking/housework;Assist for transportation;Help with stairs or ramp for entrance   Can travel by private vehicle   No    Equipment Recommendations Rolling walker (2 wheels);BSC/3in1;Wheelchair (measurements PT);Wheelchair cushion (measurements PT)  Recommendations for Other Services  OT consult    Functional Status Assessment Patient has had a recent decline in their functional status and  demonstrates the ability to make significant improvements in function in a reasonable and predictable amount of time.     Precautions / Restrictions Precautions Precautions: Fall Recall of Precautions/Restrictions: Impaired Restrictions Weight Bearing Restrictions Per Provider Order: Yes RLE Weight Bearing Per Provider Order: Weight bearing as tolerated      Mobility  Bed Mobility Overal bed mobility: Needs Assistance Bed Mobility: Supine to Sit, Sit to Supine     Supine to sit: Mod assist, +2 for physical assistance, HOB elevated Sit to supine: Mod assist, +2 for physical assistance, HOB elevated   General bed mobility comments: assist for trunk and B LE's; vc's for technique    Transfers Overall transfer level: Needs assistance Equipment used: Rolling walker (2 wheels) Transfers: Sit to/from Stand Sit to Stand: Min assist, Mod assist, +2 physical assistance           General transfer comment: x2 trials standing from bed; assist to initiate and come to full stand; assist to control descent sitting; limited/minimal WB'ing noted to R LE    Ambulation/Gait Ambulation/Gait assistance: Total assist   Assistive device: Rolling walker (2 wheels)         General Gait Details: pt unable to take any steps with 2 assist, cueing, and use of walker  Stairs            Wheelchair Mobility     Tilt Bed    Modified Rankin (Stroke Patients Only)       Balance Overall balance assessment: Needs assistance Sitting-balance support: Bilateral upper extremity supported, Feet supported Sitting balance-Leahy Scale: Poor Sitting balance - Comments: initially mod assist for sitting balance (d/t posterior lean) which progressed to min assist to  CGA Postural control: Posterior lean Standing balance support: Bilateral upper extremity supported, Reliant on assistive device for balance Standing balance-Leahy Scale: Poor Standing balance comment: 2 assist for safety in standing  with RW use                             Pertinent Vitals/Pain Pain Assessment Pain Assessment: Faces Faces Pain Scale: Hurts little more (4/10 at rest; 8/10 with activity) Pain Location: R hip/thigh Pain Descriptors / Indicators: Aching, Discomfort, Grimacing, Guarding, Tender Pain Intervention(s): Limited activity within patient's tolerance, Monitored during session, Premedicated before session, Repositioned, Other (comment) (RN present to give pt pain medication end of session)    Home Living Family/patient expects to be discharged to:: Private residence Living Arrangements: Alone                 Additional Comments: Pt with difficulty verbalizing home set-up; no family present.  Per chart pt lives alone.    Prior Function               Mobility Comments: Per chart pt has daily aides and supportive family. ADLs Comments: Assist with cooking and cleaning.     Extremity/Trunk Assessment   Upper Extremity Assessment Upper Extremity Assessment: Overall WFL for tasks assessed    Lower Extremity Assessment Lower Extremity Assessment: RLE deficits/detail (L LE appearing WFL with movement) RLE Deficits / Details: R LE positioned in internal rotation; limited R LE active movement d/t pain RLE: Unable to fully assess due to pain    Cervical / Trunk Assessment Cervical / Trunk Assessment: Normal  Communication   Communication Communication: No apparent difficulties    Cognition Arousal: Alert Behavior During Therapy: Anxious   PT - Cognitive impairments: History of cognitive impairments                       PT - Cognition Comments: Pt oriented to name and year of birthday only Following commands: Impaired Following commands impaired: Only follows one step commands consistently, Follows one step commands with increased time     Cueing Cueing Techniques: Verbal cues, Gestural cues, Tactile cues, Visual cues     General Comments General  comments (skin integrity, edema, etc.): R thigh appearing swollen compared to L thigh (nurse present and notified MD who was present end of session and assesed pt).  Nursing cleared pt for participation in physical therapy.  Pt agreeable to PT session.    Exercises     Assessment/Plan    PT Assessment Patient needs continued PT services  PT Problem List Decreased strength;Decreased activity tolerance;Decreased balance;Decreased mobility;Decreased knowledge of use of DME;Decreased knowledge of precautions;Decreased skin integrity;Pain       PT Treatment Interventions DME instruction;Gait training;Functional mobility training;Therapeutic activities;Therapeutic exercise;Balance training;Patient/family education    PT Goals (Current goals can be found in the Care Plan section)  Acute Rehab PT Goals Patient Stated Goal: to improve pain and walking PT Goal Formulation: With patient Time For Goal Achievement: 02/01/24 Potential to Achieve Goals: Good    Frequency 7X/week     Co-evaluation               AM-PAC PT "6 Clicks" Mobility  Outcome Measure Help needed turning from your back to your side while in a flat bed without using bedrails?: A Little Help needed moving from lying on your back to sitting on the side of a flat bed without using bedrails?: Total  Help needed moving to and from a bed to a chair (including a wheelchair)?: Total Help needed standing up from a chair using your arms (e.g., wheelchair or bedside chair)?: Total Help needed to walk in hospital room?: Total Help needed climbing 3-5 steps with a railing? : Total 6 Click Score: 8    End of Session Equipment Utilized During Treatment: Gait belt Activity Tolerance: Patient limited by pain Patient left: in bed;with call bell/phone within reach;with bed alarm set;with nursing/sitter in room;Other (comment) (B heels floating via pillow support; R LE supported via pillow to decrease R LE internal rotation) Nurse  Communication: Mobility status;Patient requests pain meds;Precautions;Weight bearing status (NT present taking orthostatics during session (see flow sheet for details; pt unable to maintain standing for 3 minute orthostatic vitals)) PT Visit Diagnosis: Other abnormalities of gait and mobility (R26.89);Muscle weakness (generalized) (M62.81);History of falling (Z91.81);Pain Pain - Right/Left: Right Pain - part of body: Hip    Time: 0935-1001 PT Time Calculation (min) (ACUTE ONLY): 26 min   Charges:   PT Evaluation $PT Eval Low Complexity: 1 Low PT Treatments $Therapeutic Activity: 8-22 mins PT General Charges $$ ACUTE PT VISIT: 1 Visit        Amador Junes, PT 01/18/24, 10:27 AM

## 2024-01-18 NOTE — Progress Notes (Signed)
 Pt had low diastolic (below 60s) throughout the day. Her 8 pm vitals were stable, patient was asymptomatic but there was a concern for giving her both atenolol  and hydralazine , given that she just had surgery today. On-call MD was consulted, Hydralazine  was made PRN and doc ordered to hold atenolol  just for tonight.

## 2024-01-18 NOTE — Progress Notes (Signed)
 PHARMACIST - PHYSICIAN COMMUNICATION  CONCERNING:  Enoxaparin  (Lovenox ) for DVT Prophylaxis    RECOMMENDATION: Patient was prescribed enoxaprin 40mg  q24 hours for VTE prophylaxis.   Filed Weights   01/16/24 2329  Weight: 66.3 kg (146 lb 2.6 oz)    Body mass index is 22.89 kg/m.  Estimated Creatinine Clearance: 24.8 mL/min (A) (by C-G formula based on SCr of 1.35 mg/dL (H)).   Patient is candidate for enoxaparin  30mg  every 24 hours based on CrCl <15ml/min or Weight <45kg  DESCRIPTION: Pharmacy has adjusted enoxaparin  dose per [facility] policy.  Patient is now receiving enoxaparin  30 mg every 24 hours    Malone Sear, PharmD Clinical Pharmacist  01/18/2024 10:46 AM

## 2024-01-18 NOTE — Progress Notes (Signed)
 Dulcian.Dow- MD was notified that patient has low out put post surgery. The bladder scan was performed indication 750 plus ml volume. Requested cath order. Foley was ordered 4 am.

## 2024-01-18 NOTE — Progress Notes (Addendum)
 Subjective: 1 Day Post-Op Procedure(s) (LRB): FIXATION, FRACTURE, INTERTROCHANTERIC, WITH INTRAMEDULLARY ROD (Right) Patient reports pain as moderate.   Patient seen in rounds with Dr. Daun Epstein. Patient is well, and has had no acute complaints or problems. Denies any CP, SOB, N/V, fevers or chills We will start therapy today.  Plan is to go Skilled nursing facility after hospital stay.  Objective: Vital signs in last 24 hours: Temp:  [97.2 F (36.2 C)-98.5 F (36.9 C)] 97.7 F (36.5 C) (04/20 0751) Pulse Rate:  [60-79] 79 (04/20 0751) Resp:  [10-18] 16 (04/20 0751) BP: (106-128)/(43-55) 128/50 (04/20 0751) SpO2:  [90 %-100 %] 96 % (04/20 0751)  Intake/Output from previous day:  Intake/Output Summary (Last 24 hours) at 01/18/2024 0826 Last data filed at 01/18/2024 0500 Gross per 24 hour  Intake 650 ml  Output 650 ml  Net 0 ml    Intake/Output this shift: No intake/output data recorded.  Labs: Recent Labs    01/16/24 1459 01/17/24 0436 01/18/24 0403  HGB 11.4* 10.7* 7.6*   Recent Labs    01/17/24 0436 01/18/24 0403  WBC 10.2 12.7*  RBC 3.75* 2.59*  HCT 33.1* 23.2*  PLT 163 125*   Recent Labs    01/17/24 0436 01/18/24 0403  NA 139 135  K 4.7 4.8  CL 104 104  CO2 25 25  BUN 16 21  CREATININE 1.01* 1.35*  GLUCOSE 125* 115*  CALCIUM 9.3 8.7*   No results for input(s): "LABPT", "INR" in the last 72 hours.  EXAM General - Patient is Alert, Appropriate, and Oriented Extremity - Neurologically intact Neurovascular intact Sensation intact distally Intact pulses distally Dorsiflexion/Plantar flexion intact No cellulitis present Compartment soft - swollen around the thigh Dressing - dressing C/D/I and no drainage Motor Function - intact, moving foot and toes well on exam.  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   Weight as of this encounter: 66.3 kg. Advance diet Up with therapy VSS Did have some postoperative urinary retention issues, foley in place Labs show Hemoglobin of 7.6 and GFR of 36, continue to monitor Patient will work with physical therapy Weight-Bearing as tolerated to right leg  DVT Prophylaxis: Lovenox  and SCDs Patient will follow-up with [facility] orthopedics in 2 weeks for re-imaging and reevaluation  Wadie Guile, PA-C [facility] 01/18/2024, 8:26 AM

## 2024-01-18 NOTE — Progress Notes (Signed)
 Progress Note   Patient: Nicole Ramsey UJW:[id number] DOB: Jul 10, 1927 DOA: 01/16/2024     2 DOS: the patient was seen and examined on 01/18/2024   Brief hospital course: Nicole Ramsey is a 89 year old female with history of dementia, history of left thalamus lacunar infarct, hypertension, hypothyroid, GERD, who presents emergency department from home via EMS for chief concerns of a fall and complaints of right hip pain  Patient is admitted for ortho evaluation of displaced comminuted fracture right femur.  Assessment and Plan: * Displaced comminuted fracture of shaft of right femur, initial encounter for closed fracture [facility]) Orthopedic service follow up with Dr. Daun Epstein appreciated. S/p Reduction and internal fixation of displaced intertrochanteric right hip fracture with [employer] TFN nail 01/17/24. Continue pan control. Monitor for swelling, bruise at surgical site. PT/ OT evaluation advised SNF Out of bed to chair.  Heart failure with preserved ejection fraction (HCC) 10/31/2020: Estimated ejection fraction 65 to 70%, grade 1 diastolic dysfunction Not in acute exacerbation at this time.  Hypokalemia Mag normal. K improved with supplements.  Anxiety Home alprazolam  0.25 mg p.o. nightly as needed for anxiety, sleep ordered.  Hypertension Hold amlodipine  10 mg daily, atenolol  50 mg p.o as BP Lower side.  Continue to monitor BP. Orthostatic vitals n.  Hypothyroid Levothyroxine  50 mcg daily ordered.  Depression, major, recurrent, moderate (HCC) Home citalopram  10 mg daily, quetiapine  25 mg nightly resumed on admission      Out of bed to chair. Incentive spirometry. Nursing supportive care. Fall, aspiration precautions. Diet:  Diet Orders (From admission, onward)     Start     Ordered   01/18/24 0732  Diet regular Room service appropriate? Yes; Fluid consistency: Thin  Diet effective now       Question Answer Comment  Room service appropriate? Yes   Fluid  consistency: Thin      01/18/24 0732           DVT prophylaxis: enoxaparin  (LOVENOX ) injection 30 mg Start: 01/19/24 0800 SCDs Start: 01/17/24 1305 Place TED hose Start: 01/17/24 1305  Level of care: Telemetry Medical   Code Status: Limited: Do not attempt resuscitation (DNR) -DNR-LIMITED -Do Not Intubate/DNI   Subjective: Patient is seen and examined today morning. She feels more pain at surgical site. Unable to work with PT due to pain.  Physical Exam: Vitals:   01/18/24 0005 01/18/24 0442 01/18/24 0751 01/18/24 0941  BP: (!) 128/54 (!) 128/48 (!) 128/50 (!) 117/46  Pulse: 66 78 79 80  Resp: 15 14 16    Temp: 98.5 F (36.9 C) 98.5 F (36.9 C) 97.7 F (36.5 C)   TempSrc:      SpO2: 96% 95% 96% 98%  Weight:      Height:        General - Elderly African American female, mild distress due to pain HEENT - PERRLA, EOMI, atraumatic head, non tender sinuses. Lung - Clear, no rales, rhonchi, wheezes. Heart - S1, S2 heard, no murmurs, rubs, trace pedal edema. Abdomen - Soft, non tender, bowel sounds good Neuro - Alert, awake and oriented x 3, non focal exam. Skin - Warm and dry. Right hip swelling, sutures note.  Data Reviewed:      Latest Ref Rng & Units 01/18/2024    4:03 AM 01/17/2024    4:36 AM 01/16/2024    2:59 PM  CBC  WBC 4.0 - 10.5 K/uL 12.7  10.2  9.5   Hemoglobin 12.0 - 15.0 g/dL 7.6  56.2  11.4   Hematocrit 36.0 - 46.0 % 23.2  33.1  35.6   Platelets 150 - 400 K/uL 125  163  162       Latest Ref Rng & Units 01/18/2024    4:03 AM 01/17/2024    4:36 AM 01/16/2024    2:59 PM  BMP  Glucose 70 - 99 mg/dL 161  096  045   BUN 8 - 23 mg/dL 21  16  10    Creatinine 0.44 - 1.00 mg/dL 4.09  8.11  9.14   Sodium 135 - 145 mmol/L 135  139  140   Potassium 3.5 - 5.1 mmol/L 4.8  4.7  3.1   Chloride 98 - 111 mmol/L 104  104  108   CO2 22 - 32 mmol/L 25  25  22    Calcium 8.9 - 10.3 mg/dL 8.7  9.3  8.2    DG HIP UNILAT WITH PELVIS 2-3 VIEWS RIGHT Result Date:  01/17/2024 CLINICAL DATA:  Elective surgery.  Right hip intramedullary nail. EXAM: DG HIP (WITH OR WITHOUT PELVIS) 2-3V RIGHT COMPARISON:  Pelvis and right hip radiographs 01/16/2024 FINDINGS: Images were performed intraoperatively without the presence of a radiologist. The patient is undergoing long cephalo medullary nail fixation of the previously seen comminuted, angulated, and displaced fracture of the right femoral intertrochanteric region and proximal diaphysis. Improved alignment. Total fluoroscopy images: 6 Total fluoroscopy time: 33 seconds Total dose: Radiation Exposure Index (as provided by the fluoroscopic device): 3.98 mGy air Kerma Please see intraoperative findings for further detail. IMPRESSION: Intraoperative fluoroscopy for right femoral fracture fixation. Electronically Signed   By: Bertina Broccoli M.D.   On: 01/17/2024 17:15   DG C-Arm 1-60 Min-No Report Result Date: 01/17/2024 Fluoroscopy was utilized by the requesting physician.  No radiographic interpretation.   CT HEAD WO CONTRAST ( ) Result Date: 01/16/2024 CLINICAL DATA:  Head trauma, minor. EXAM: CT HEAD WITHOUT CONTRAST TECHNIQUE: Contiguous axial images were obtained from the base of the skull through the vertex without intravenous contrast. RADIATION DOSE REDUCTION: This exam was performed according to the departmental dose-optimization program which includes automated exposure control, adjustment of the mA and/or kV according to patient size and/or use of iterative reconstruction technique. COMPARISON:  Head CT 07/07/2021.  MRI brain 07/06/2021. FINDINGS: Brain: No acute hemorrhage. Stable background of moderate chronic small-vessel disease and old lacunar infarct in the left thalamus. Cortical gray-white differentiation is otherwise preserved. Prominence of the ventricles and sulci within expected range for age. No hydrocephalus or extra-axial collection. No mass effect or midline shift. Vascular: No hyperdense vessel or  unexpected calcification. Skull: No calvarial fracture or suspicious bone lesion. Skull base is unremarkable. Sinuses/Orbits: No acute findings. Other: None. IMPRESSION: 1. No acute intracranial abnormality. 2. Stable background of moderate chronic small-vessel disease and old lacunar infarct in the left thalamus. Electronically Signed   By: Audra Blend M.D.   On: 01/16/2024 15:33   DG Chest 1 View Result Date: 01/16/2024 CLINICAL DATA:  Fall. EXAM: CHEST  1 VIEW COMPARISON:  12/08/2022. FINDINGS: Low lung volume. Mild prominence of interstitial markings is nonspecific and may be secondary to underlying age-related fibrosis/scarring. Bilateral lung fields are otherwise clear. No dense consolidation or lung collapse. Bilateral costophrenic angles are clear. Normal cardio-mediastinal silhouette. No acute osseous abnormalities. The soft tissues are within normal limits. IMPRESSION: No active disease. Electronically Signed   By: Beula Brunswick M.D.   On: 01/16/2024 14:35   DG HIP UNILAT W OR W/O PELVIS 2-3 VIEWS RIGHT  Result Date: 01/16/2024 CLINICAL DATA:  Fall.  Right hip pain. EXAM: DG HIP (WITH OR WITHOUT PELVIS) 2-3V RIGHT COMPARISON:  None Available. FINDINGS: There is comminuted and displaced/angulated fracture of the proximal right femur. No other acute fracture or dislocation. No aggressive osseous lesion. Visualized sacral arcuate lines are unremarkable. Unremarkable symphysis pubis. There are mild-to-moderate degenerative changes of bilateral hip joints characterized by joint space narrowing and osteophytosis of the superior acetabulum. No radiopaque foreign bodies. IMPRESSION: *Comminuted and displaced/angulated fracture of the proximal right femur. Electronically Signed   By: Beula Brunswick M.D.   On: 01/16/2024 14:34    Family Communication: Discussed with patient, she understand and agree. All questions answered.  Disposition: Status is: Inpatient Remains inpatient appropriate because:  post ortho procedure, pain control, SNF placement.  Planned Discharge Destination: Home with Home Health     Time spent: 38 minutes  Author: Aisha Hove, MD 01/18/2024 1:14 PM Secure chat 7am to 7pm For on call review [email or web address].

## 2024-01-18 NOTE — Progress Notes (Signed)
 Notified MD ( Sreeram, Poggi and [place] PA) of notable swelling of right thigh. Nurse noted drop in Hgb this am and wanted MD to be aware of significant edema to the surgical thigh. No drainage noted to dressings. Ice pack placed, MD assessed. Nurse questioned Lovenox  as this is due. MD stated to give Lovenox . MD held some bp meds as bp has been soft. Will continue to monitor.

## 2024-01-18 NOTE — Plan of Care (Signed)

## 2024-01-18 NOTE — Progress Notes (Addendum)
   CROSS COVER NOTE  Patient name: Nicole Ramsey MRN: [id number] DOB : Sep 28, 1927  Concern as stated by nurse / staff   Same patient several interventions were used to get her to urinate. She was unable to get up for us  safely. But since her surgery she has only given us  100 Ml.Her recent bladder scan shows over 750 ml. Given that she is 89 years old dementia patient, I think she will gain more help from foley than just I&O CATH. Please could you place a cath order.    Review of Pertinent findings: >714mL on bladder scan and unable to void spontaneously.   Assessment and  Interventions   Assessment:post-op urinary retention common and residual anesthesia possibly contributing.   Plan: Place foley catheter and consider voiding trial in short interval Continue IV fluids Follow up renal function am labs  Also: discontinued docusate as performs worse than placebo for constipation treatment and don't want stool burden to contribute to urinary retention. Alternatively added miralax  and continue the PRN MoM

## 2024-01-19 ENCOUNTER — Encounter: Payer: Self-pay | Admitting: Surgery

## 2024-01-19 DIAGNOSIS — S72351A Displaced comminuted fracture of shaft of right femur, initial encounter for closed fracture: Secondary | ICD-10-CM | POA: Diagnosis not present

## 2024-01-19 DIAGNOSIS — E039 Hypothyroidism, unspecified: Secondary | ICD-10-CM | POA: Diagnosis not present

## 2024-01-19 DIAGNOSIS — E876 Hypokalemia: Secondary | ICD-10-CM | POA: Diagnosis not present

## 2024-01-19 DIAGNOSIS — S728X2A Other fracture of left femur, initial encounter for closed fracture: Secondary | ICD-10-CM | POA: Diagnosis not present

## 2024-01-19 LAB — IRON AND TIBC
Iron: 24 ug/dL — ABNORMAL LOW (ref 28–170)
Saturation Ratios: 11 % (ref 10.4–31.8)
TIBC: 210 ug/dL — ABNORMAL LOW (ref 250–450)
UIBC: 186 ug/dL

## 2024-01-19 LAB — PREPARE RBC (CROSSMATCH)

## 2024-01-19 LAB — CBC
HCT: 21 % — ABNORMAL LOW (ref 36.0–46.0)
Hemoglobin: 6.7 g/dL — ABNORMAL LOW (ref 12.0–15.0)
MCH: 28.6 pg (ref 26.0–34.0)
MCHC: 31.9 g/dL (ref 30.0–36.0)
MCV: 89.7 fL (ref 80.0–100.0)
Platelets: 112 10*3/uL — ABNORMAL LOW (ref 150–400)
RBC: 2.34 MIL/uL — ABNORMAL LOW (ref 3.87–5.11)
RDW: 15.7 % — ABNORMAL HIGH (ref 11.5–15.5)
WBC: 9.9 10*3/uL (ref 4.0–10.5)
nRBC: 0 % (ref 0.0–0.2)

## 2024-01-19 LAB — FERRITIN: Ferritin: 81 ng/mL (ref 11–307)

## 2024-01-19 LAB — HEMOGLOBIN AND HEMATOCRIT, BLOOD
HCT: 26.2 % — ABNORMAL LOW (ref 36.0–46.0)
Hemoglobin: 8.7 g/dL — ABNORMAL LOW (ref 12.0–15.0)

## 2024-01-19 LAB — ABO/RH: ABO/RH(D): A NEG

## 2024-01-19 LAB — FOLATE: Folate: 6.8 ng/mL (ref >5.9)

## 2024-01-19 MED ORDER — AMLODIPINE BESYLATE 10 MG PO TABS
10.0000 mg | ORAL_TABLET | Freq: Every day | ORAL | Status: DC
  Administered 2024-01-19 – 2024-01-27 (×8): 10 mg via ORAL
  Filled 2024-01-19 (×9): qty 1

## 2024-01-19 MED ORDER — CHLORHEXIDINE GLUCONATE CLOTH 2 % EX PADS
6.0000 | MEDICATED_PAD | Freq: Every day | CUTANEOUS | Status: DC
  Administered 2024-01-19 – 2024-01-27 (×9): 6 via TOPICAL

## 2024-01-19 MED ORDER — SODIUM CHLORIDE 0.9% IV SOLUTION: Freq: Once | INTRAVENOUS | Status: AC

## 2024-01-19 MED ORDER — SODIUM CHLORIDE 0.9% IV SOLUTION: Freq: Once | INTRAVENOUS | Status: DC

## 2024-01-19 NOTE — Progress Notes (Signed)
 Subjective: 2 Days Post-Op Procedure(s) (LRB): FIXATION, FRACTURE, INTERTROCHANTERIC, WITH INTRAMEDULLARY ROD (Right) Patient reports pain as moderate.  Patient is well, and has had no acute complaints or problems. Denies any CP, SOB, N/V, fevers or chills We will continue with PT today.  Plan is to go Skilled nursing facility after hospital stay.  Objective: Vital signs in last 24 hours: Temp:  [97.7 F (36.5 C)-97.9 F (36.6 C)] 97.9 F (36.6 C) (04/21 0721) Pulse Rate:  [67-80] 68 (04/21 0721) Resp:  [16-17] 17 (04/21 0721) BP: (114-129)/(44-56) 129/54 (04/21 0721) SpO2:  [92 %-98 %] 92 % (04/21 0721)  Intake/Output from previous day:  Intake/Output Summary (Last 24 hours) at 01/19/2024 0739 Last data filed at 01/18/2024 1853 Gross per 24 hour  Intake 120 ml  Output --  Net 120 ml    Intake/Output this shift: No intake/output data recorded.  Labs: Recent Labs    01/16/24 1459 01/17/24 0436 01/18/24 0403 01/18/24 1637 01/19/24 0341  HGB 11.4* 10.7* 7.6* 7.3* 6.7*   Recent Labs    01/18/24 0403 01/18/24 1637 01/19/24 0341  WBC 12.7*  --  9.9  RBC 2.59*  --  2.34*  HCT 23.2* 22.2* 21.0*  PLT 125*  --  112*   Recent Labs    01/17/24 0436 01/18/24 0403  NA 139 135  K 4.7 4.8  CL 104 104  CO2 25 25  BUN 16 21  CREATININE 1.01* 1.35*  GLUCOSE 125* 115*  CALCIUM 9.3 8.7*   No results for input(s): "LABPT", "INR" in the last 72 hours.  EXAM General - Patient is Alert, Appropriate, and Oriented Extremity - Neurologically intact Neurovascular intact Sensation intact distally Intact pulses distally Dorsiflexion/Plantar flexion intact No cellulitis present Compartment soft - swollen around the thigh Dressing - dressing C/D/I and no drainage Motor Function - intact, moving foot and toes well on exam.  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   Weight as of this encounter: 66.3 kg. Advance diet Up with therapy VSS Pain moderate Acute post op blood loss anemia - Hgb 6.7, transfuse 1 unit of PRBC today. Recheck Hgb post transfusion CM to assist with discharge to SNF  Patient will follow-up with [facility] orthopedics in 2 weeks for re-imaging and reevaluation   DVT Prophylaxis: TEDs SCDs  T. Thomos Flies, PA-C [facility] 01/19/2024, 7:39 AM

## 2024-01-19 NOTE — Plan of Care (Signed)
  Problem: Health Behavior/Discharge Planning: Goal: Ability to manage health-related needs will improve Outcome: Progressing   Problem: Clinical Measurements: Goal: Ability to maintain clinical measurements within normal limits will improve Outcome: Progressing Goal: Cardiovascular complication will be avoided Outcome: Progressing   Problem: Activity: Goal: Risk for activity intolerance will decrease Outcome: Progressing   Problem: Nutrition: Goal: Adequate nutrition will be maintained Outcome: Progressing   Problem: Coping: Goal: Level of anxiety will decrease Outcome: Progressing   Problem: Elimination: Goal: Will not experience complications related to bowel motility Outcome: Progressing Goal: Will not experience complications related to urinary retention Outcome: Progressing   Problem: Pain Managment: Goal: General experience of comfort will improve and/or be controlled Outcome: Progressing   Problem: Safety: Goal: Ability to remain free from injury will improve Outcome: Progressing   Problem: Skin Integrity: Goal: Risk for impaired skin integrity will decrease Outcome: Progressing

## 2024-01-19 NOTE — [place] FL2 (Signed)
 [place]  MEDICAID FL2 LEVEL OF CARE FORM     IDENTIFICATION  Patient Name: Nicole Ramsey Birthdate: 1927/01/12 Sex: female Admission Date (Current Location): 01/16/2024  [facility] and [place] Number:  Chiropodist and Address:  [facility], [address], [place], [place] [address]      Provider Number: [id number]  Attending Physician Name and Address:  Aisha Hove, MD  Relative Name and Phone Number:  Lucielle Vokes, son, phone:[phone]    Current Level of Care: Hospital Recommended Level of Care: Skilled Nursing Facility Prior Approval Number:    Date Approved/Denied: 08/09/21 PASRR Number: [id number]  Discharge Plan: SNF    Current Diagnoses: Patient Active Problem List   Diagnosis Date Noted   Acute urinary retention 01/18/2024   Acute postoperative anemia due to expected blood loss 01/18/2024   Hypokalemia 01/16/2024   Displaced comminuted fracture of shaft of right femur, initial encounter for closed fracture (HCC) 01/16/2024   Heart failure with preserved ejection fraction (HCC) 01/16/2024   Hypertensive urgency 07/07/2021   Type 2 diabetes mellitus (HCC) 07/07/2021   Vascular dementia (HCC) 07/07/2021   Anxiety 07/07/2021   Depression 07/07/2021   Hyponatremia 07/07/2021   AKI (acute kidney injury) (HCC) 07/07/2021   AMS (altered mental status) 07/06/2021   Failure to thrive in adult 11/06/2020   Protein-calorie malnutrition, severe 11/03/2020   Syncope 10/30/2020   Depression, major, recurrent, moderate (HCC) 10/27/2020   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 (HCC) 10/27/2020   Hypothyroid 10/27/2020   Hypertension 10/27/2020    Orientation RESPIRATION BLADDER Height & Weight     Self  Normal Indwelling catheter Weight: 66.3 kg Height:  5\' 7"  (170.2 cm)  BEHAVIORAL SYMPTOMS/MOOD NEUROLOGICAL BOWEL NUTRITION STATUS      Incontinent  (Please see discharge summary)  AMBULATORY  STATUS COMMUNICATION OF NEEDS Skin   Total Care Verbally Surgical wounds (Dry)                       Personal Care Assistance Level of Assistance  Bathing, Feeding, Dressing Bathing Assistance: Maximum assistance Feeding assistance: Limited assistance Dressing Assistance: Maximum assistance     Functional Limitations Info             SPECIAL CARE FACTORS FREQUENCY  PT (By licensed PT), OT (By licensed OT)     PT Frequency: 5x per week OT Frequency: 5x per week            Contractures      Additional Factors Info  Code Status Code Status Info: DNR             Current Medications (01/19/2024):  This is the current hospital active medication list Current Facility-Administered Medications  Medication Dose Route Frequency Provider Last Rate Last Admin   0.9 %  sodium chloride  infusion (Manually program via Guardrails IV Fluids)   Intravenous Once Sreeram, Narendranath, MD       acetaminophen  (TYLENOL ) tablet 325-650 mg  325-650 mg Oral Q6H PRN Poggi, John J, MD   325 mg at 01/19/24 1147   ALPRAZolam  (XANAX ) tablet 0.25 mg  0.25 mg Oral QHS PRN Poggi, John J, MD   0.25 mg at 01/18/24 2035   bisacodyl  (DULCOLAX) suppository 10 mg  10 mg Rectal Daily PRN Poggi, John J, MD       Chlorhexidine  Gluconate Cloth 2 % PADS 6 each  6 each Topical Daily Aisha Hove, MD   6 each at 01/19/24 1009  citalopram  (CELEXA ) tablet 10 mg  10 mg Oral Daily Poggi, Kaylene Pascal, MD   10 mg at 01/19/24 1006   feeding supplement (ENSURE ENLIVE / ENSURE PLUS) liquid 237 mL  237 mL Oral BID BM Poggi, John J, MD   237 mL at 01/19/24 1008   levothyroxine  (SYNTHROID ) tablet 50 mcg  50 mcg Oral Q0600 Poggi, John J, MD   50 mcg at 01/19/24 0550   magnesium  hydroxide (MILK OF MAGNESIA) suspension 30 mL  30 mL Oral Daily PRN Poggi, John J, MD       meclizine  (ANTIVERT ) tablet 25 mg  25 mg Oral TID PRN Poggi, John J, MD       metoCLOPramide  (REGLAN ) tablet 5-10 mg  5-10 mg Oral Q8H PRN Poggi, John  J, MD       Or   metoCLOPramide  (REGLAN ) injection 5-10 mg  5-10 mg Intravenous Q8H PRN Poggi, John J, MD       multivitamin with minerals tablet 1 tablet  1 tablet Oral Daily Sreeram, Narendranath, MD   1 tablet at 01/19/24 1002   ondansetron  (ZOFRAN ) tablet 4 mg  4 mg Oral Q6H PRN Poggi, John J, MD       Or   ondansetron  (ZOFRAN ) injection 4 mg  4 mg Intravenous Q6H PRN Poggi, John J, MD       Oral care mouth rinse  15 mL Mouth Rinse PRN Poggi, Kaylene Pascal, MD       pantoprazole  (PROTONIX ) EC tablet 40 mg  40 mg Oral BID Poggi, John J, MD   40 mg at 01/19/24 1006   polyethylene glycol (MIRALAX  / GLYCOLAX ) packet 17 g  17 g Oral BID Ree Candy, MD   17 g at 01/19/24 1003   QUEtiapine  (SEROQUEL ) tablet 25 mg  25 mg Oral QHS Poggi, John J, MD   25 mg at 01/18/24 2034   sodium phosphate  (FLEET) enema 1 enema  1 enema Rectal Once PRN Poggi, John J, MD         Discharge Medications: Please see discharge summary for a list of discharge medications.  Relevant Imaging Results:  Relevant Lab Results:   Additional Information Aspirin  No severity specified - Other (See Comments) ; SSN: [id number]  Crayton Docker, RN

## 2024-01-19 NOTE — Progress Notes (Signed)
 PT Cancellation Note  Patient Details Name: Nicole Ramsey MRN: [id number] DOB: [birth year]   Cancelled Treatment:    Reason Eval/Treat Not Completed: Other (comment).  Pt currently receiving blood transfusion (Hgb noted to be 6.7 this morning).  Will re-attempt PT session at a later date/time.  Amador Junes, PT 01/19/24, 1:06 PM

## 2024-01-19 NOTE — Evaluation (Signed)
 Occupational Therapy Evaluation Patient Details Name: Nicole Ramsey MRN: [id number] DOB: Mar 29, 1927 Today's Date: 01/19/2024   History of Present Illness   Pt is a 89 y.o. female presenting to hospital 01/16/24 s/p fall; c/o R hip pain.  Imaging showing comminuted and displaced/angulated fracture of the proximal right  femur.  S/p reduction and internal fixation of displaced intertrochanteric right hip fracture with Biomet Affixis TFN nail 01/17/24. PMH includes htn, thyroid  disease, chronic hip pain, dementia, L thalamus lacunar infarct.     Clinical Impressions Pt was seen for OT evaluation and co-tx with PT this date. Pt presents to acute OT demonstrating impaired ADL performance and functional mobility 2/2 impaired BLE strength, R hip/leg pain, balance, and safety awareness (See OT problem list for additional functional deficits). Pt currently requires MAX A +2 for all bed mobility, MIN-MOD A  +2 for STS from elevated EOB, and heavy assist for all LB ADL, and toileting. Emotional support and active listening provided to support pt as she was tearful and appeared fearful to move 2/2 pain. Pt would benefit from skilled OT services to address noted impairments and functional limitations (see below for any additional details) in order to maximize safety and independence while minimizing falls risk and caregiver burden.    If plan is discharge home, recommend the following:   Two people to help with walking and/or transfers;A lot of help with bathing/dressing/bathroom;Direct supervision/assist for medications management;Supervision due to cognitive status;Direct supervision/assist for financial management;Assist for transportation;Assistance with cooking/housework;Help with stairs or ramp for entrance     Functional Status Assessment   Patient has had a recent decline in their functional status and demonstrates the ability to make significant improvements in function in a reasonable and  predictable amount of time.     Equipment Recommendations   Other (comment) (defer to next venue)     Recommendations for Other Services         Precautions/Restrictions   Precautions Recall of Precautions/Restrictions: Impaired Restrictions Weight Bearing Restrictions Per Provider Order: Yes RLE Weight Bearing Per Provider Order: Weight bearing as tolerated     Mobility Bed Mobility Overal bed mobility: Needs Assistance Bed Mobility: Supine to Sit, Sit to Supine     Supine to sit: Max assist, +2 for physical assistance, HOB elevated, Used rails          Transfers   Equipment used: Rolling walker (2 wheels) Transfers: Sit to/from Stand Sit to Stand: Min assist, Mod assist, +2 physical assistance           General transfer comment: x1 trial standing from bed; assist to initiate and come to full stand; assist to control descent sitting; limited WB'ing noted to R LE; vc's and tactile cues for UE/LE positioning      Balance Overall balance assessment: Needs assistance Sitting-balance support: Bilateral upper extremity supported, Feet supported Sitting balance-Leahy Scale: Poor Sitting balance - Comments: fluctuating between max assist to briefly close SBA Postural control: Posterior lean Standing balance support: Bilateral upper extremity supported, Reliant on assistive device for balance Standing balance-Leahy Scale: Poor Standing balance comment: 2 assist for safety in standing with RW use                           ADL either performed or assessed with clinical judgement   ADL Overall ADL's : Needs assistance/impaired Eating/Feeding: Bed level;Set up  Functional mobility during ADLs: Minimal assistance;Moderate assistance;Rolling walker (2 wheels) General ADL Comments: Anticipate MAX A for all LB ADL tasks and at least MOD A for seated UB ADL tasks 2/2 R hip pain and impaired balance      Vision         Perception         Praxis         Pertinent Vitals/Pain Pain Assessment Pain Assessment: Faces Faces Pain Scale: Hurts whole lot Pain Location: R hip/thigh 8/10 with movement; visually 4/10 at rest Pain Descriptors / Indicators: Discomfort, Grimacing, Guarding, Tender, Moaning Pain Intervention(s): Limited activity within patient's tolerance, Monitored during session, Premedicated before session, Repositioned, Patient requesting pain meds-RN notified     Extremity/Trunk Assessment Upper Extremity Assessment Upper Extremity Assessment: Overall WFL for tasks assessed   Lower Extremity Assessment Lower Extremity Assessment: RLE deficits/detail RLE Deficits / Details: R LE positioned in internal rotation; limited R LE active movement d/t pain RLE: Unable to fully assess due to pain       Communication Communication Communication: No apparent difficulties   Cognition Arousal: Alert Behavior During Therapy: Anxious Cognition: No family/caregiver present to determine baseline             OT - Cognition Comments: oriented to self                 Following commands: Impaired Following commands impaired: Only follows one step commands consistently, Follows one step commands with increased time     Cueing  General Comments   Cueing Techniques: Verbal cues;Gestural cues;Tactile cues;Visual cues  R thigh appearing swollen; pt also tending to keep R LE internally rotated in general; HR 80 bpm with SpO2 sats 96% on room air post activity   Exercises     Shoulder Instructions      Home Living Family/patient expects to be discharged to:: Private residence Living Arrangements: Alone                               Additional Comments: Pt with difficulty verbalizing home set-up; no family present.  Per chart pt lives alone.      Prior Functioning/Environment               Mobility Comments: Per chart pt has daily aides  and supportive family. ADLs Comments: Assist with cooking and cleaning.    OT Problem List: Decreased strength;Pain;Decreased range of motion;Decreased cognition;Decreased safety awareness;Decreased activity tolerance;Impaired balance (sitting and/or standing);Decreased knowledge of use of DME or AE   OT Treatment/Interventions: Self-care/ADL training;Therapeutic exercise;Therapeutic activities;Cognitive remediation/compensation;DME and/or AE instruction;Patient/family education;Balance training      OT Goals(Current goals can be found in the care plan section)   Acute Rehab OT Goals Patient Stated Goal: have less pain OT Goal Formulation: With patient Time For Goal Achievement: 02/02/24 Potential to Achieve Goals: Good ADL Goals Pt Will Perform Grooming: sitting;with set-up;with supervision Pt Will Transfer to Toilet: bedside commode;with mod assist (SPT vs step pivot tf vs amb; LRAD) Pt Will Perform Toileting - Clothing Manipulation and hygiene: with modified independence;sitting/lateral leans   OT Frequency:  Min 2X/week    Co-evaluation PT/OT/SLP Co-Evaluation/Treatment: Yes Reason for Co-Treatment: Necessary to address cognition/behavior during functional activity;For patient/therapist safety;To address functional/ADL transfers PT goals addressed during session: Mobility/safety with mobility;Balance;Proper use of DME OT goals addressed during session: ADL's and self-care      AM-PAC OT "6 Clicks" Daily Activity  Outcome Measure Help from another person eating meals?: None Help from another person taking care of personal grooming?: A Little Help from another person toileting, which includes using toliet, bedpan, or urinal?: Total Help from another person bathing (including washing, rinsing, drying)?: A Lot Help from another person to put on and taking off regular upper body clothing?: A Lot Help from another person to put on and taking off regular lower body clothing?: A  Lot 6 Click Score: 14   End of Session Equipment Utilized During Treatment: Rolling walker (2 wheels) Nurse Communication: Patient requests pain meds;Mobility status  Activity Tolerance: Patient limited by pain Patient left: in bed;with call bell/phone within reach;with bed alarm set  OT Visit Diagnosis: Other abnormalities of gait and mobility (R26.89);Muscle weakness (generalized) (M62.81);Pain Pain - Right/Left: Right Pain - part of body: Hip;Leg                Time: 9323-5573 OT Time Calculation (min): 22 min Charges:  OT General Charges $OT Visit: 1 Visit OT Evaluation $OT Eval Moderate Complexity: 1 Mod  Berenda Breaker., MPH, MS, OTR/L ascom [phone] 01/19/24, 3:58 PM

## 2024-01-19 NOTE — Progress Notes (Signed)
 Progress Note   Patient: DWANNA GOSHERT ZOX:[id number] DOB: 10-18-1926 DOA: 01/16/2024     3 DOS: the patient was seen and examined on 01/19/2024   Brief hospital course: Ms. Nykayla Marcelli is a 89 year old female with history of dementia, history of left thalamus lacunar infarct, hypertension, hypothyroid, GERD, who presents emergency department from home via EMS for chief concerns of a fall and complaints of right hip pain  Patient is admitted for ortho evaluation of displaced comminuted fracture right femur.  Assessment and Plan: * Displaced comminuted fracture of shaft of right femur, initial encounter for closed fracture [facility]) Orthopedic service follow up with Dr. Daun Epstein appreciated. S/p Reduction and internal fixation of displaced intertrochanteric right hip fracture with Biomet Affixis TFN nail 01/17/24. Continue pan control. Monitor for swelling, bruise at surgical site. PT/ OT evaluation advised SNF Out of bed to chair.  Acute blood loss anemia Post op blood loss- Hb 6.7, baseline at 11 Will give one unit of blood, repeat h/h 5pm. Monitor hb, watch for bleeding at surgical site.  Heart failure with preserved ejection fraction (HCC) 10/31/2020: Estimated ejection fraction 65 to 70%, grade 1 diastolic dysfunction Not in acute exacerbation at this time.  Hypokalemia Mag normal. K improved with supplements.  Anxiety Home alprazolam  0.25 mg p.o. nightly as needed for anxiety, sleep ordered.  Hypertension Resumed amlodipine  10 mg daily, plan to restart atenolol  50 mg p.o if BP elevated.  Continue to monitor BP. Orthostatic vitals normal.  Hypothyroid Levothyroxine  50 mcg daily ordered.  Depression, major, recurrent, moderate (HCC) Home citalopram  10 mg daily, quetiapine  25 mg nightly resumed on admission      Out of bed to chair. Incentive spirometry. Nursing supportive care. Fall, aspiration precautions. Diet:  Diet Orders (From admission, onward)     Start      Ordered   01/18/24 0732  Diet regular Room service appropriate? Yes; Fluid consistency: Thin  Diet effective now       Question Answer Comment  Room service appropriate? Yes   Fluid consistency: Thin      01/18/24 0732           DVT prophylaxis: SCDs Start: 01/17/24 1305 Place TED hose Start: 01/17/24 1305  Level of care: Telemetry Medical   Code Status: Limited: Do not attempt resuscitation (DNR) -DNR-LIMITED -Do Not Intubate/DNI   Subjective: Patient is seen and examined today morning. She denies pain. Did not get out of bed. Family at bedside. Eating fair.  Physical Exam: Vitals:   01/19/24 0742 01/19/24 1105 01/19/24 1135 01/19/24 1350  BP: 130/68 136/80 (!) 130/49 136/71  Pulse:  70 70 73  Resp:  18 16 16   Temp:  (!) 97.3 F (36.3 C) 98.5 F (36.9 C) 98.3 F (36.8 C)  TempSrc:  Oral Oral Oral  SpO2:  96% 95%   Weight:      Height:        General - Elderly African American female, no apparent distress HEENT - PERRLA, EOMI, atraumatic head, non tender sinuses. Lung - Clear, no rales, rhonchi, wheezes. Heart - S1, S2 heard, no murmurs, rubs, trace pedal edema. Abdomen - Soft, non tender, bowel sounds good Neuro - Alert, awake and oriented x 3, non focal exam. Skin - Warm and dry. Right hip swelling, sutures note.  Data Reviewed:      Latest Ref Rng & Units 01/19/2024    3:41 AM 01/18/2024    4:37 PM 01/18/2024    4:03 AM  CBC  WBC  4.0 - 10.5 K/uL 9.9   12.7   Hemoglobin 12.0 - 15.0 g/dL 6.7  7.3  7.6   Hematocrit 36.0 - 46.0 % 21.0  22.2  23.2   Platelets 150 - 400 K/uL 112   125       Latest Ref Rng & Units 01/18/2024    4:03 AM 01/17/2024    4:36 AM 01/16/2024    2:59 PM  BMP  Glucose 70 - 99 mg/dL 213  086  578   BUN 8 - 23 mg/dL 21  16  10    Creatinine 0.44 - 1.00 mg/dL 4.69  6.29  5.28   Sodium 135 - 145 mmol/L 135  139  140   Potassium 3.5 - 5.1 mmol/L 4.8  4.7  3.1   Chloride 98 - 111 mmol/L 104  104  108   CO2 22 - 32 mmol/L 25  25  22     Calcium 8.9 - 10.3 mg/dL 8.7  9.3  8.2    No results found.   Family Communication: Discussed with patient, family at bedside, son over phone. They understand and agree. All questions answered.  Disposition: Status is: Inpatient Remains inpatient appropriate because: post ortho procedure, blood loss, pain control, SNF placement.  Planned Discharge Destination: Home with Home Health     Time spent: 42 minutes  Author: Aisha Hove, MD 01/19/2024 2:59 PM Secure chat 7am to 7pm For on call review [email or web address].

## 2024-01-19 NOTE — TOC Progression Note (Addendum)
 Transition of Care [facility]) - Progression Note    Patient Details  Name: Nicole Ramsey MRN: [id number] Date of Birth: Jul 30, 1927  [facility] CM/SW Contact  Crayton Docker, RN 01/19/2024, 11:28 AM  Clinical Narrative:     CM to patient's room regarding TOC screening. Patient procedure in progress. CM call to patient's son, Reymundo Caulk, phone; [phone]. Patient's fiance, Bartholomew Light answer and will inform patient's son of CM call. CM call to [employer], son, phone; [phone], no answer, CM left message for return call.    CM initiated PASSR, screening running, ref# is [id number]. CM will follow up.     Expected Discharge Plan and Services    SNF recommendations      Social Determinants of Health (SDOH) Interventions SDOH Screenings   Food Insecurity: Patient Unable To Answer (01/16/2024)  Housing: Patient Unable To Answer (01/16/2024)  Transportation Needs: Patient Unable To Answer (01/16/2024)  Utilities: Patient Unable To Answer (01/16/2024)  Financial Resource Strain: Low Risk  (01/09/2024)   Received from [facility]  Social Connections: Patient Unable To Answer (01/16/2024)  Tobacco Use: Low Risk  (01/17/2024)  Recent Concern: Tobacco Use - Medium Risk (01/09/2024)   Received from [facility]    Readmission Risk Interventions     No data to display

## 2024-01-19 NOTE — Progress Notes (Signed)
 OT Cancellation Note  Patient Details Name: Nicole Ramsey MRN: [id number] DOB: 04-Jun-1927   Cancelled Treatment:    Reason Eval/Treat Not Completed: Other (comment). Consult received, chart reviewed. Hgb 6.7, pending transfusion. Secure chat with RN. Plan to evaluate pt s/p blood transfusion and pain medication.   Onur Mori R., MPH, MS, OTR/L ascom [phone] 01/19/24, 8:11 AM

## 2024-01-19 NOTE — TOC Initial Note (Addendum)
 Transition of Care [facility]) - Initial/Assessment Note    Patient Details  Name: Nicole Ramsey MRN: [id number] Date of Birth: 08/03/1927  Transition of Care [facility]) CM/SW Contact:    Crayton Docker, RN Phone Number: 01/19/2024, 12:10 PM  Clinical Narrative:                   Call received from patient's son, Reymundo Caulk per voicemail message. Per patient's son, patient lives alone and has friend come to provide caregiver support daily and homecare comes 3 times per week. Per patient's son, patient has previous SNF experience. Patient son cannot recall name of SNF. CM will complete FL2. PASSR screening running.    CM faxed SNF referral to [facility] [place], [address] [facility] [place], [facility], [facility], and [facility].   Noted, 4 accepting SNFs, [facility], [facility] and [facility]. CM follow up call placed to patient's son, Reymundo Caulk regarding SNF offers. Per Reymundo Caulk will discuss with patient's other children and call CM back. CM will continue to follow for discharge care planning needs.   Expected Discharge Plan: Skilled Nursing Facility Barriers to Discharge: Continued Medical Work up   Patient Goals and CMS Choice    Per patient's son, SNF     Expected Discharge Plan and Services    SNF     Prior Living Arrangements/Services   Lives with:: Other (Comment) (per son, Reymundo Caulk, comment, patient lives alone and has friend assists daily and homecare 3x per week)          Need for Family Participation in Patient Care: Yes (Comment) Care giver support system in place?: Yes (comment) Current home services: DME (per son, Reymundo Caulk, comment, patient lives alone and has friend assists daily and homecare 3x per week. DME has 4 point cane) Criminal Activity/Legal Involvement Pertinent to Current Situation/Hospitalization: No - Comment as needed  Activities of Daily  Living   ADL Screening (condition at time of admission) Independently performs ADLs?: No Does the patient have a NEW difficulty with bathing/dressing/toileting/self-feeding that is expected to last >3 days?: No Does the patient have a NEW difficulty with getting in/out of bed, walking, or climbing stairs that is expected to last >3 days?: Yes (Initiates electronic notice to provider for possible PT consult) Does the patient have a NEW difficulty with communication that is expected to last >3 days?: No Is the patient deaf or have difficulty hearing?: No Does the patient have difficulty seeing, even when wearing glasses/contacts?: No Does the patient have difficulty concentrating, remembering, or making decisions?: Yes  Permission Sought/Granted Permission sought to share information with : Case Manager     Emotional Assessment Appearance:: Appears stated age Attitude/Demeanor/Rapport: Apprehensive Affect (typically observed): Anxious Orientation: : Oriented to Self Alcohol / Substance Use: Alcohol Use Psych Involvement: No (comment)  Admission diagnosis:  Closed left femoral fracture (HCC) [S72.92XA] Closed fracture of right hip, initial encounter (HCC) [S72.001A] Patient Active Problem List   Diagnosis Date Noted   Acute urinary retention 01/18/2024   Acute postoperative anemia due to expected blood loss 01/18/2024   Hypokalemia 01/16/2024   Displaced comminuted fracture of shaft of right femur, initial encounter for closed fracture (HCC) 01/16/2024   Heart failure with preserved ejection fraction (HCC) 01/16/2024   Hypertensive urgency 07/07/2021   Type 2 diabetes mellitus (HCC) 07/07/2021   Vascular dementia (HCC) 07/07/2021   Anxiety 07/07/2021   Depression 07/07/2021   Hyponatremia 07/07/2021  AKI (acute kidney injury) (HCC) 07/07/2021   AMS (altered mental status) 07/06/2021   Failure to thrive in adult 11/06/2020   Protein-calorie malnutrition, severe 11/03/2020    Syncope 10/30/2020   Depression, major, recurrent, moderate (HCC) 10/27/2020   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 [place] [address] Phone: [phone] Fax: [phone]     Social Drivers of Health (SDOH) Social History: SDOH Screenings   Food Insecurity: Patient Unable To Answer (01/16/2024)  Housing: Patient Unable To Answer (01/16/2024)  Transportation Needs: Patient Unable To Answer (01/16/2024)  Utilities: Patient Unable To Answer (01/16/2024)  Financial Resource Strain: Low Risk  (01/09/2024)   Received from [facility]  Social Connections: Patient Unable To Answer (01/16/2024)  Tobacco Use: Low Risk  (01/17/2024)  Recent Concern: Tobacco Use - Medium Risk (01/09/2024)   Received from [facility]   SDOH Interventions:     Readmission Risk Interventions     No data to display

## 2024-01-19 NOTE — Progress Notes (Signed)
 Physical Therapy Treatment Patient Details Name: OTTILIA PIPPENGER MRN: [id number] DOB: [birth year] Today's Date: 01/19/2024   History of Present Illness Pt is a 89 y.o. female presenting to hospital 01/16/24 s/p fall; c/o R hip pain.  Imaging showing comminuted and displaced/angulated fracture of the proximal right  femur.  S/p reduction and internal fixation of displaced intertrochanteric right hip fracture with Biomet Affixis TFN nail 01/17/24. PMH includes htn, thyroid  disease, chronic hip pain, dementia, L thalamus lacunar infarct.    PT Comments  Pt resting in bed upon PT/OT arrival; pt agreeable to therapy with some encouragement; PT/OT co-session performed.  During session pt max assist x2 with bed mobility; sitting balance fluctuating between max assist to close SBA d/t posterior lean; and pt able to briefly stand with min to mod assist x2 up to RW (limited WB'ing noted through R LE).  Pt tending to keep R LE internally rotated.  Pt moaning/yelling out in pain intermittently (nurse notified of pt's request for pain medication; pt received pain medication prior to session).  Will continue to focus on strengthening, balance, and progressive functional mobility per pt tolerance.   If plan is discharge home, recommend the following: Two people to help with walking and/or transfers;A lot of help with bathing/dressing/bathroom;Assistance with cooking/housework;Assist for transportation;Help with stairs or ramp for entrance   Can travel by private vehicle     No  Equipment Recommendations  Rolling walker (2 wheels);BSC/3in1;Wheelchair (measurements PT);Wheelchair cushion (measurements PT);Hospital bed;Hoyer lift    Recommendations for Other Services       Precautions / Restrictions Precautions Precautions: Fall Recall of Precautions/Restrictions: Impaired Restrictions Weight Bearing Restrictions Per Provider Order: Yes RLE Weight Bearing Per Provider Order: Weight bearing as tolerated      Mobility  Bed Mobility Overal bed mobility: Needs Assistance Bed Mobility: Supine to Sit, Sit to Supine     Supine to sit: Max assist, +2 for physical assistance, HOB elevated, Used rails Sit to supine: Max assist, +2 for physical assistance, HOB elevated   General bed mobility comments: assist for trunk and B LE's; vc's for technique    Transfers Overall transfer level: Needs assistance Equipment used: Rolling walker (2 wheels) Transfers: Sit to/from Stand Sit to Stand: Min assist, Mod assist, +2 physical assistance           General transfer comment: x1 trial standing from bed; assist to initiate and come to full stand; assist to control descent sitting; limited WB'ing noted to R LE; vc's and tactile cues for UE/LE positioning    Ambulation/Gait Ambulation/Gait assistance: Total assist             General Gait Details: pt unable to take any steps with 2 assist, cueing, and use of walker   Stairs             Wheelchair Mobility     Tilt Bed    Modified Rankin (Stroke Patients Only)       Balance Overall balance assessment: Needs assistance Sitting-balance support: Bilateral upper extremity supported, Feet supported Sitting balance-Leahy Scale: Poor Sitting balance - Comments: fluctuating between max assist to briefly close SBA Postural control: Posterior lean Standing balance support: Bilateral upper extremity supported, Reliant on assistive device for balance Standing balance-Leahy Scale: Poor Standing balance comment: 2 assist for safety in standing with RW use                            Communication Communication Communication:  No apparent difficulties  Cognition Arousal: Alert Behavior During Therapy: Anxious   PT - Cognitive impairments: History of cognitive impairments                       PT - Cognition Comments: Pt oriented to name, DOB, and [place] Buck[place].  Pt did not know she was in the hospital.  Pt aware  she fell and hurt her hip. Following commands: Impaired Following commands impaired: Only follows one step commands consistently, Follows one step commands with increased time    Cueing Cueing Techniques: Verbal cues, Gestural cues, Tactile cues, Visual cues  Exercises      General Comments General comments (skin integrity, edema, etc.): R thigh appearing swollen; pt also tending to keep R LE internally rotated in general; HR 80 bpm with SpO2 sats 96% on room air post activity.  Nursing cleared pt for participation in physical therapy.      Pertinent Vitals/Pain Pain Assessment Pain Assessment: Faces Pain Score: 8  Pain Location: R hip/thigh Pain Descriptors / Indicators: Discomfort, Grimacing, Guarding, Tender, Moaning Pain Intervention(s): Limited activity within patient's tolerance, Monitored during session, Premedicated before session, Repositioned, Patient requesting pain meds-RN notified    Home Living                          Prior Function            PT Goals (current goals can now be found in the care plan section) Acute Rehab PT Goals Patient Stated Goal: to improve pain and walking PT Goal Formulation: With patient Time For Goal Achievement: 02/01/24 Potential to Achieve Goals: Good Progress towards PT goals: Progressing toward goals    Frequency    7X/week      PT Plan      Co-evaluation PT/OT/SLP Co-Evaluation/Treatment: Yes Reason for Co-Treatment: Necessary to address cognition/behavior during functional activity;For patient/therapist safety;To address functional/ADL transfers PT goals addressed during session: Mobility/safety with mobility;Balance;Proper use of DME OT goals addressed during session: ADL's and self-care      AM-PAC PT "6 Clicks" Mobility   Outcome Measure  Help needed turning from your back to your side while in a flat bed without using bedrails?: A Little Help needed moving from lying on your back to sitting on the  side of a flat bed without using bedrails?: Total Help needed moving to and from a bed to a chair (including a wheelchair)?: Total Help needed standing up from a chair using your arms (e.g., wheelchair or bedside chair)?: Total Help needed to walk in hospital room?: Total Help needed climbing 3-5 steps with a railing? : Total 6 Click Score: 8    End of Session   Activity Tolerance: Patient limited by pain Patient left: in bed;with call bell/phone within reach;with bed alarm set;with nursing/sitter in room;Other (comment) (R LE elevated via pillow and to promote more neutral position (decrease internal rotation)) Nurse Communication: Mobility status;Patient requests pain meds;Precautions;Weight bearing status PT Visit Diagnosis: Other abnormalities of gait and mobility (R26.89);Muscle weakness (generalized) (M62.81);History of falling (Z91.81);Pain Pain - Right/Left: Right Pain - part of body: Hip     Time: 1441-1501 PT Time Calculation (min) (ACUTE ONLY): 20 min  Charges:    $Therapeutic Activity: 8-22 mins PT General Charges $$ ACUTE PT VISIT: 1 Visit                     Amador Junes, PT 01/19/24, 3:20 PM

## 2024-01-19 NOTE — Care Management Important Message (Signed)
 Important Message  Patient Details  Name: Nicole Ramsey MRN: [id number] Date of Birth: 06/21/1927   Important Message Given:  Yes - Medicare IM     Ricard Faulkner W, CMA 01/19/2024, 10:28 AM

## 2024-01-20 DIAGNOSIS — S728X2A Other fracture of left femur, initial encounter for closed fracture: Secondary | ICD-10-CM | POA: Diagnosis not present

## 2024-01-20 DIAGNOSIS — E039 Hypothyroidism, unspecified: Secondary | ICD-10-CM | POA: Diagnosis not present

## 2024-01-20 DIAGNOSIS — S72351A Displaced comminuted fracture of shaft of right femur, initial encounter for closed fracture: Secondary | ICD-10-CM | POA: Diagnosis not present

## 2024-01-20 DIAGNOSIS — E876 Hypokalemia: Secondary | ICD-10-CM | POA: Diagnosis not present

## 2024-01-20 LAB — BASIC METABOLIC PANEL WITH GFR
Anion gap: 7 (ref 5–15)
BUN: 13 mg/dL (ref 8–23)
CO2: 28 mmol/L (ref 22–32)
Calcium: 8.6 mg/dL — ABNORMAL LOW (ref 8.9–10.3)
Chloride: 103 mmol/L (ref 98–111)
Creatinine, Ser: 0.89 mg/dL (ref 0.44–1.00)
GFR, Estimated: >60 mL/min (ref >60)
Glucose, Bld: 117 mg/dL — ABNORMAL HIGH (ref 70–99)
Potassium: 3.8 mmol/L (ref 3.5–5.1)
Sodium: 138 mmol/L (ref 135–145)

## 2024-01-20 LAB — CBC
HCT: 25.9 % — ABNORMAL LOW (ref 36.0–46.0)
Hemoglobin: 8.5 g/dL — ABNORMAL LOW (ref 12.0–15.0)
MCH: 28.8 pg (ref 26.0–34.0)
MCHC: 32.8 g/dL (ref 30.0–36.0)
MCV: 87.8 fL (ref 80.0–100.0)
Platelets: 146 10*3/uL — ABNORMAL LOW (ref 150–400)
RBC: 2.95 MIL/uL — ABNORMAL LOW (ref 3.87–5.11)
RDW: 15.3 % (ref 11.5–15.5)
WBC: 10.1 10*3/uL (ref 4.0–10.5)
nRBC: 0 % (ref 0.0–0.2)

## 2024-01-20 LAB — TYPE AND SCREEN
ABO/RH(D): A NEG
Antibody Screen: NEGATIVE
Unit division: 0

## 2024-01-20 LAB — BPAM RBC
Blood Product Expiration Date: 202505092359
ISSUE DATE / TIME: 202504211105
Unit Type and Rh: 600

## 2024-01-20 LAB — VITAMIN B12: Vitamin B-12: 661 pg/mL (ref 180–914)

## 2024-01-20 MED ORDER — ACETAMINOPHEN 500 MG PO TABS: 500.0000 mg | ORAL_TABLET | Freq: Four times a day (QID) | ORAL | 0 refills | Status: AC | PRN

## 2024-01-20 MED ORDER — FERROUS SULFATE 325 (65 FE) MG PO TABS
325.0000 mg | ORAL_TABLET | Freq: Every day | ORAL | Status: DC
  Administered 2024-01-20 – 2024-01-27 (×7): 325 mg via ORAL
  Filled 2024-01-20 (×8): qty 1

## 2024-01-20 MED ORDER — OXYCODONE-ACETAMINOPHEN 5-325 MG PO TABS
1.0000 | ORAL_TABLET | Freq: Four times a day (QID) | ORAL | Status: DC | PRN
  Administered 2024-01-20: 1 via ORAL
  Filled 2024-01-20: qty 1

## 2024-01-20 NOTE — Progress Notes (Signed)
 Nutrition Follow-up  DOCUMENTATION CODES:   Severe malnutrition in context of chronic illness  INTERVENTION:   -Continue regular diet -MVI with minerals daily -Continue Ensure Enlive po BID, each supplement provides 350 kcal and 20 grams of protein.  -Magic cup TID with meals, each supplement provides 290 kcal and 9 grams of protein  -Feeding assistance with meals   NUTRITION DIAGNOSIS:   Severe Malnutrition related to chronic illness (dementia) as evidenced by moderate fat depletion, severe fat depletion, moderate muscle depletion, severe muscle depletion.  Ongoing  GOAL:   Patient will meet greater than or equal to 90% of their needs  Progressing   MONITOR:   PO intake, Supplement acceptance  REASON FOR ASSESSMENT:   Consult Hip fracture protocol  ASSESSMENT:   89 y.o. female with a history of dementia, a history of left thalamus lacunar infarct, hypertension, hypothyroidism, and gastroesophageal reflux disease who normally lives alone, but is closely attended to by family living close by as well as by aides who come to the house daily.  Apparently, the patient was in her usual state of health yesterday afternoon when she lost her balance and fell, landing on her right hip.  She was brought to the emergency room where x-rays demonstrated a displaced comminuted intertrochanteric fracture of the right hip.  4/19- s/p Reduction and internal fixation of displaced intertrochanteric right hip fracture with Biomet Affixis TFN nail.   Reviewed I/O's: -2.3 L x 24 hours and -859 ml since admission  UOP: 2.8 L x 24 hours  Pt lying in bed at time of visit. No family at bedside. Pt arouse briefly and made eye contact with RD. Pt did not answer questions, but occasionally made humming sounds when questions were asked.   Pt currently on a regular diet. Noted meal completions 40-50%. Pt has been accepting Ensure supplements.   Reviewed wt hx; wt has been stable over the past year.    Given pt's advanced age and dementia, RD would not recommend artifical means of nutrition/ hydration as this would not enhance pt's quality of life.   Medications reviewed and include celexa , ferrous sulfate , protonix , and miralax .   Labs reviewed: CBGS: 148 (inpatient orders for glycemic control are none).    NUTRITION - FOCUSED PHYSICAL EXAM:  Flowsheet Row Most Recent Value  Orbital Region Severe depletion  Upper Arm Region Severe depletion  Thoracic and Lumbar Region Moderate depletion  Buccal Region Moderate depletion  Temple Region Severe depletion  Clavicle Bone Region Severe depletion  Clavicle and Acromion Bone Region Severe depletion  Scapular Bone Region Severe depletion  Dorsal Hand Moderate depletion  Patellar Region Mild depletion  Anterior Thigh Region Mild depletion  Posterior Calf Region Mild depletion  Edema (RD Assessment) None  Hair Reviewed  Eyes Reviewed  Mouth Reviewed  Skin Reviewed  Nails Reviewed       Diet Order:   Diet Order             Diet regular Room service appropriate? Yes; Fluid consistency: Thin  Diet effective now                   EDUCATION NEEDS:   No education needs have been identified at this time  Skin:  Skin Assessment: Skin Integrity Issues: Skin Integrity Issues:: Incisions Incisions: closed rt hip  Last BM:  01/19/24 (type 1)  Height:   Ht Readings from Last 1 Encounters:  01/16/24 5\' 7"  (1.702 m)    Weight:   Wt Readings  from Last 1 Encounters:  01/16/24 66.3 kg    Ideal Body Weight:  61.4 kg  BMI:  Body mass index is 22.89 kg/m.  Estimated Nutritional Needs:   Kcal:  1650-1850  Protein:  80-95 grams  Fluid:  > 1.6 L    Herschel Lords, RD, LDN, CDCES Registered Dietitian III Certified Diabetes Care and Education Specialist If unable to reach this RD, please use "RD Inpatient" group chat on secure chat between hours of 8am-4 pm daily

## 2024-01-20 NOTE — Progress Notes (Signed)
 Progress Note   Patient: Nicole Ramsey ERX:[id number] DOB: 10/31/1926 DOA: 01/16/2024     4 DOS: the patient was seen and examined on 01/20/2024   Brief hospital course: Nicole Ramsey is a 89 year old female with history of dementia, history of left thalamus lacunar infarct, hypertension, hypothyroid, GERD, who presents emergency department from home via EMS for chief concerns of a fall and complaints of right hip pain  Patient is admitted for ortho evaluation of displaced comminuted fracture right femur. S/p Reduction and internal fixation of displaced intertrochanteric right hip fracture with Biomet Affixis TFN nail 01/17/24. 01/19/24 Hb dropped to 6.7 a unit of PRBC transfused. She is awaiting rehab placement.  Assessment and Plan: * Displaced comminuted fracture of shaft of right femur, initial encounter for closed fracture [facility]) Orthopedic service follow up with Dr. Daun Epstein appreciated. S/p Reduction and internal fixation of displaced intertrochanteric right hip fracture with Biomet Affixis TFN nail 01/17/24. Continue pan control. Monitor for swelling, bruise at surgical site. PT/ OT evaluation advised SNF, TOC working on placement Encourage out of bed to chair.  Acute blood loss anemia Post op blood loss- Iron deficiency- iron supplements ordered. Hb 6.7 improved to 8.7 s/p 1 unit PRBC. Monitor hb, watch for bleeding at surgical site.  Heart failure with preserved ejection fraction (HCC) 10/31/2020: Estimated ejection fraction 65 to 70%, grade 1 diastolic dysfunction Not in acute exacerbation at this time.  Hypokalemia Mag normal. K improved with supplements.  Anxiety Home alprazolam  0.25 mg p.o. nightly as needed for anxiety, sleep ordered.  Hypertension Resumed amlodipine  10 mg daily, plan to restart atenolol  50 mg p.o if BP elevated. Continue to monitor BP.  Orthostatic vitals normal.  Hypothyroid Continue Levothyroxine  50 mcg daily.  Depression, major, recurrent,  moderate (HCC) Home citalopram  10 mg daily, quetiapine  25 mg nightly resumed.      Out of bed to chair. Incentive spirometry. Nursing supportive care. Fall, aspiration precautions. Diet:  Diet Orders (From admission, onward)     Start     Ordered   01/18/24 0732  Diet regular Room service appropriate? Yes; Fluid consistency: Thin  Diet effective now       Question Answer Comment  Room service appropriate? Yes   Fluid consistency: Thin      01/18/24 0732           DVT prophylaxis: SCDs Start: 01/17/24 1305 Place TED hose Start: 01/17/24 1305  Level of care: Telemetry Medical   Code Status: Limited: Do not attempt resuscitation (DNR) -DNR-LIMITED -Do Not Intubate/DNI   Subjective: Patient is seen and examined today morning. She is sleeping comfortably, able to open eyes. Did not work with PT due to pain. No family at bedside. Eating poor.  Physical Exam: Vitals:   01/19/24 1350 01/19/24 1556 01/19/24 2008 01/20/24 0735  BP: 136/71 (!) 140/54 (!) 159/66 136/80  Pulse: 73 79 82 72  Resp: 16 16 17 18   Temp: 98.3 F (36.8 C) 98.8 F (37.1 C) 98.1 F (36.7 C) 98 F (36.7 C)  TempSrc: Oral  Oral Oral  SpO2:  95% 91% 96%  Weight:      Height:        General - Elderly African American female, no apparent distress HEENT - PERRLA, EOMI, atraumatic head, non tender sinuses. Lung - Clear, no rales, rhonchi, wheezes. Heart - S1, S2 heard, no murmurs, rubs, trace pedal edema. Abdomen - Soft, non tender, bowel sounds good Neuro - Alert, awake and oriented x 3, non focal  exam. Skin - Warm and dry. Right hip swelling, sutures note.  Data Reviewed:      Latest Ref Rng & Units 01/20/2024    8:51 AM 01/19/2024    5:30 PM 01/19/2024    3:41 AM  CBC  WBC 4.0 - 10.5 K/uL 10.1   9.9   Hemoglobin 12.0 - 15.0 g/dL 8.5  8.7  6.7   Hematocrit 36.0 - 46.0 % 25.9  26.2  21.0   Platelets 150 - 400 K/uL 146   112       Latest Ref Rng & Units 01/20/2024    8:51 AM 01/18/2024     4:03 AM 01/17/2024    4:36 AM  BMP  Glucose 70 - 99 mg/dL 161  096  045   BUN 8 - 23 mg/dL 13  21  16    Creatinine 0.44 - 1.00 mg/dL 4.09  8.11  9.14   Sodium 135 - 145 mmol/L 138  135  139   Potassium 3.5 - 5.1 mmol/L 3.8  4.8  4.7   Chloride 98 - 111 mmol/L 103  104  104   CO2 22 - 32 mmol/L 28  25  25    Calcium 8.9 - 10.3 mg/dL 8.6  8.7  9.3    No results found.   Family Communication: Discussed with patient. They understand and agree. All questions answered.  Disposition: Status is: Inpatient Remains inpatient appropriate because: post ortho procedure, blood loss, pain control, SNF placement.  Planned Discharge Destination: Home with Home Health     Time spent: 40 minutes  Author: Aisha Hove, MD 01/20/2024 1:55 PM Secure chat 7am to 7pm For on call review [email or web address].

## 2024-01-20 NOTE — Discharge Instructions (Signed)
Diet: As you were doing prior to hospitalization   Shower:  May shower but keep the wounds dry, use an occlusive plastic wrap, NO SOAKING IN TUB.  If the bandage gets wet, change with a clean dry gauze.  Dressing:  You may change your dressing as needed. Change the dressing with sterile gauze dressing.    Activity:  Increase activity slowly as tolerated, but follow the weight bearing instructions below.  No lifting or driving for 6 weeks.  Weight Bearing:   Weight bearing as tolerated to right lower extremity  To prevent constipation: you may use a stool softener such as -  Colace (over the counter) 100 mg by mouth twice a day  Drink plenty of fluids (prune juice may be helpful) and high fiber foods Miralax (over the counter) for constipation as needed.    Itching:  If you experience itching with your medications, try taking only a single pain pill, or even half a pain pill at a time.  You may take up to 10 pain pills per day, and you can also use benadryl over the counter for itching or also to help with sleep.   Precautions:  If you experience chest pain or shortness of breath - call 911 immediately for transfer to the hospital emergency department!!  If you develop a fever greater that 101 F, purulent drainage from wound, increased redness or drainage from wound, or calf pain-Call [facility]                                               Follow- Up Appointment:  Please call for an appointment to be seen in 6 weeks at [facility]  

## 2024-01-20 NOTE — Progress Notes (Signed)
 Subjective: 3 Days Post-Op Procedure(s) (LRB): FIXATION, FRACTURE, INTERTROCHANTERIC, WITH INTRAMEDULLARY ROD (Right) Patient asleep when I entered the room.  Upon waking her up she states "I'm fine, leave me alone, get out". Patient does not give me a pain score. Patient is well, and has had no acute complaints or problems.  Denies any CP, SOB, N/V, fevers or chills We will continue with PT today.  Plan is to go Skilled nursing facility after hospital stay.  Objective: Vital signs in last 24 hours: Temp:  [97.3 F (36.3 C)-98.8 F (37.1 C)] 98.1 F (36.7 C) (04/21 2008) Pulse Rate:  [70-82] 82 (04/21 2008) Resp:  [16-18] 17 (04/21 2008) BP: (130-159)/(49-80) 159/66 (04/21 2008) SpO2:  [91 %-96 %] 91 % (04/21 2008)  Intake/Output from previous day:  Intake/Output Summary (Last 24 hours) at 01/20/2024 0725 Last data filed at 01/20/2024 0518 Gross per 24 hour  Intake 455 ml  Output 1000 ml  Net -545 ml    Intake/Output this shift: No intake/output data recorded.  Labs: Recent Labs    01/18/24 0403 01/18/24 1637 01/19/24 0341 01/19/24 1730  HGB 7.6* 7.3* 6.7* 8.7*   Recent Labs    01/18/24 0403 01/18/24 1637 01/19/24 0341 01/19/24 1730  WBC 12.7*  --  9.9  --   RBC 2.59*  --  2.34*  --   HCT 23.2*   < > 21.0* 26.2*  PLT 125*  --  112*  --    < > = values in this interval not displayed.   Recent Labs    01/18/24 0403  NA 135  K 4.8  CL 104  CO2 25  BUN 21  CREATININE 1.35*  GLUCOSE 115*  CALCIUM 8.7*   No results for input(s): "LABPT", "INR" in the last 72 hours.  EXAM General - Patient is Alert and Oriented Extremity - Neurologically intact Neurovascular intact Sensation intact distally Intact pulses distally Dorsiflexion/Plantar flexion intact No cellulitis present Compartment soft - right thigh is soft to palpation. Dressing - dressing C/D/I and no drainage Motor Function - intact, moving foot and toes well on exam.  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   Weight as of this encounter: 66.3 kg. Advance diet Up with therapy  VSS She does not give me a pain score, tells me to leave the room. S/P 1 unit of PRBC yesterday.  Hg up to 8.7 yesterday, will recheck today. Continue with PT.  Plan for d/c to SNF pending progress with PT. CM to assist with discharge planning.  Staples can be removed by SNF on 01/31/24. Follow-up with [facility] in 6 weeks for x-rays of the right femur.  DVT Prophylaxis: TEDs SCDs  Antoine Bathe, PA-C, CAQ-OS [facility] 01/20/2024, 7:25 AM

## 2024-01-20 NOTE — Progress Notes (Signed)
 PT Cancellation Note  Patient Details Name: Nicole Ramsey MRN: [id number] DOB: [birth year]   Cancelled Treatment:    Reason Eval/Treat Not Completed: Other (comment). PT arrived, patient awakens to therapist voice. Patient adamantly refusing PT session due to pain. RN notified. Pt reports pain in R leg, 10/10 on pain scale when asked. Will re-attempt at later date/time.    [name], PT, DPT 01/20/24 12:59 PM

## 2024-01-20 NOTE — Progress Notes (Signed)
 Son, Ronnell requested a call from this RN. Call forwarded. Son concerned about mom not getting up with PT due to pain and she might need stronger medication. This conversation was consumed yesterday and this RN made MD aware. Son had talked to the MD yesterday, but even though he requested to talk to the attending about having his mom on stronger medication than Tylenol , had said nothing to the MD. Attending notified again today about son's request.

## 2024-01-20 NOTE — TOC Progression Note (Addendum)
 Transition of Care [facility]) - Progression Note    Patient Details  Name: Nicole Ramsey MRN: [id number] Date of Birth: 04-Nov-1926  [facility] CM/SW Contact  Crayton Docker, RN Phone Number: 01/20/2024, 12:56 PM  Clinical Narrative:     CM follow up call placed to patient's son, Nicole Ramsey, phone: [phone] regarding SNF preferences. Per patient's son, Nicole Ramsey, requested CM call patient's son as patient family spokesperson, Nicole Ramsey, phone: [phone]. CM call to patient's son, Nicole Ramsey. Per patient's son, request for CM call back in about 2 hours. CM will follow up.   Call received from patient's son, Nicole Ramsey regarding SNF preferences. Per patient's son, will need to re-visit discussion of SNF preferences with patient's children and call CM back tomorrow. Patient's son requests a call from RN regarding patient status. CM alert to RN Elona regarding patient son's request to call on patient status.   Expected Discharge Plan: Skilled Nursing Facility Barriers to Discharge: Continued Medical Work up  Expected Discharge Plan and Services      SNF      Social Determinants of Health (SDOH) Interventions SDOH Screenings   Food Insecurity: Patient Unable To Answer (01/16/2024)  Housing: Patient Unable To Answer (01/16/2024)  Transportation Needs: Patient Unable To Answer (01/16/2024)  Utilities: Patient Unable To Answer (01/16/2024)  Financial Resource Strain: Low Risk  (01/09/2024)   Received from [facility]  Social Connections: Patient Unable To Answer (01/16/2024)  Tobacco Use: Low Risk  (01/17/2024)  Recent Concern: Tobacco Use - Medium Risk (01/09/2024)   Received from [facility]    Readmission Risk Interventions     No data to display

## 2024-01-20 NOTE — Progress Notes (Addendum)
 Pt became physically aggressive when this nurse and CNA attempted to get AM vitals and perform CHG bath. CHG bath partially completed due to increased aggression by pt. After placing blankets on pt and placing her in resting position pt became calm.   Pt also refusing AM labs.

## 2024-01-20 NOTE — Plan of Care (Signed)
  Problem: Education: Goal: Knowledge of General Education information will improve Description: Including pain rating scale, medication(s)/side effects and non-pharmacologic comfort measures Outcome: Progressing   Problem: Health Behavior/Discharge Planning: Goal: Ability to manage health-related needs will improve Outcome: Progressing   Problem: Clinical Measurements: Goal: Ability to maintain clinical measurements within normal limits will improve Outcome: Progressing Goal: Cardiovascular complication will be avoided Outcome: Progressing   Problem: Activity: Goal: Risk for activity intolerance will decrease Outcome: Progressing   Problem: Nutrition: Goal: Adequate nutrition will be maintained Outcome: Progressing   Problem: Coping: Goal: Level of anxiety will decrease Outcome: Progressing   Problem: Elimination: Goal: Will not experience complications related to bowel motility Outcome: Progressing Goal: Will not experience complications related to urinary retention Outcome: Progressing   Problem: Pain Managment: Goal: General experience of comfort will improve and/or be controlled Outcome: Progressing   Problem: Safety: Goal: Ability to remain free from injury will improve Outcome: Progressing   Problem: Skin Integrity: Goal: Risk for impaired skin integrity will decrease Outcome: Progressing

## 2024-01-21 DIAGNOSIS — S72351A Displaced comminuted fracture of shaft of right femur, initial encounter for closed fracture: Secondary | ICD-10-CM | POA: Diagnosis not present

## 2024-01-21 DIAGNOSIS — E039 Hypothyroidism, unspecified: Secondary | ICD-10-CM | POA: Diagnosis not present

## 2024-01-21 DIAGNOSIS — S728X2A Other fracture of left femur, initial encounter for closed fracture: Secondary | ICD-10-CM | POA: Diagnosis not present

## 2024-01-21 DIAGNOSIS — E876 Hypokalemia: Secondary | ICD-10-CM | POA: Diagnosis not present

## 2024-01-21 LAB — CBC
HCT: 24.9 % — ABNORMAL LOW (ref 36.0–46.0)
Hemoglobin: 8.4 g/dL — ABNORMAL LOW (ref 12.0–15.0)
MCH: 29.4 pg (ref 26.0–34.0)
MCHC: 33.7 g/dL (ref 30.0–36.0)
MCV: 87.1 fL (ref 80.0–100.0)
Platelets: 173 10*3/uL (ref 150–400)
RBC: 2.86 MIL/uL — ABNORMAL LOW (ref 3.87–5.11)
RDW: 15.4 % (ref 11.5–15.5)
WBC: 9.2 10*3/uL (ref 4.0–10.5)
nRBC: 0 % (ref 0.0–0.2)

## 2024-01-21 LAB — BASIC METABOLIC PANEL WITH GFR
Anion gap: 5 (ref 5–15)
BUN: 15 mg/dL (ref 8–23)
CO2: 28 mmol/L (ref 22–32)
Calcium: 8.5 mg/dL — ABNORMAL LOW (ref 8.9–10.3)
Chloride: 103 mmol/L (ref 98–111)
Creatinine, Ser: 0.76 mg/dL (ref 0.44–1.00)
GFR, Estimated: >60 mL/min (ref >60)
Glucose, Bld: 112 mg/dL — ABNORMAL HIGH (ref 70–99)
Potassium: 3.8 mmol/L (ref 3.5–5.1)
Sodium: 136 mmol/L (ref 135–145)

## 2024-01-21 MED ORDER — OXYCODONE-ACETAMINOPHEN 5-325 MG PO TABS
1.0000 | ORAL_TABLET | Freq: Four times a day (QID) | ORAL | Status: DC | PRN
  Administered 2024-01-21 – 2024-01-24 (×6): 2 via ORAL
  Filled 2024-01-21 (×6): qty 2

## 2024-01-21 MED ORDER — ACETAMINOPHEN 500 MG PO TABS
500.0000 mg | ORAL_TABLET | Freq: Four times a day (QID) | ORAL | Status: DC | PRN
  Administered 2024-01-23 – 2024-01-24 (×3): 1000 mg via ORAL
  Administered 2024-01-25 – 2024-01-26 (×3): 500 mg via ORAL
  Filled 2024-01-21 (×2): qty 2
  Filled 2024-01-21 (×4): qty 1
  Filled 2024-01-21 (×2): qty 2

## 2024-01-21 NOTE — Progress Notes (Signed)
 Physical Therapy Treatment Patient Details Name: Nicole Ramsey MRN: [id number] DOB: 1927-09-12 Today's Date: 01/21/2024   History of Present Illness Pt is a 89 y.o. female presenting to hospital 01/16/24 s/p fall; c/o R hip pain.  Imaging showing comminuted and displaced/angulated fracture of the proximal right  femur.  S/p reduction and internal fixation of displaced intertrochanteric right hip fracture with Biomet Affixis TFN nail 01/17/24. PMH includes htn, thyroid  disease, chronic hip pain, dementia, L thalamus lacunar infarct.    PT Comments  Pt was long sitting in bed. At first unwilling but eventually agreeable to session. Pt was aware she is in the hospital and that she hurt her leg/hip. She endorses severe pain + needs constant/max vcing for full participation. Pt requires constant vcs for step by step sequencing + max assist to achieve EOB sitting. Poor sitting tolerance EOB with pt constantly asking to return to bed throughout. Total assist +2 to return to bed and reposition to [facility]. Acute PT will continue to follow and progress as able per current POC. Recommend mechanical lift for any transfers/OOB activity at this time.    If plan is discharge home, recommend the following: Two people to help with walking and/or transfers;A lot of help with bathing/dressing/bathroom;Assistance with cooking/housework;Assist for transportation;Help with stairs or ramp for entrance     Equipment Recommendations  Rolling walker (2 wheels);BSC/3in1;Wheelchair (measurements PT);Wheelchair cushion (measurements PT);Hospital bed;Hoyer lift       Precautions / Restrictions Precautions Precautions: Fall Recall of Precautions/Restrictions: Impaired Restrictions Weight Bearing Restrictions Per Provider Order: No     Mobility  Bed Mobility Overal bed mobility: Needs Assistance Bed Mobility: Supine to Sit, Sit to Supine  Supine to sit: Max assist, Total assist, +2 for safety/equipment, HOB elevated, Used  rails Sit to supine: Total assist, +2 for physical assistance, +2 for safety/equipment General bed mobility comments: pt continues to require Extensive-total assist to safely achieve EOB sitting and then +2 total to return to bed and reposition to [facility].    Transfers  General transfer comment: pt unable/unwilling due to pain. RN staff encouraged to use mechanical lift for any transfers/OOB activity    Ambulation/Gait  General Gait Details: currently unable   Balance Overall balance assessment: Needs assistance Sitting-balance support: Bilateral upper extremity supported, Feet supported Sitting balance-Leahy Scale: Poor Sitting balance - Comments: posterior lean due to pain       Standing balance comment: Unwilling to attempt due to pain      Communication Communication Communication: No apparent difficulties  Cognition Arousal: Alert Behavior During Therapy: Anxious   PT - Cognitive impairments: History of cognitive impairments      PT - Cognition Comments: Pt oriented to name, DOB, and [place].  Pt did not know she was in the hospital.  Pt aware she fell and hurt her hip. Following commands: Intact      Cueing Cueing Techniques: Verbal cues, Tactile cues     General Comments General comments (skin integrity, edema, etc.): Pt severely limited by pain and anxiety with any/all movements      Pertinent Vitals/Pain Pain Assessment Pain Assessment: 0-10 Pain Score: 10-Worst pain ever Pain Location: R hip pain with any/all mobility Pain Descriptors / Indicators: Discomfort, Grimacing, Guarding, Tender, Moaning Pain Intervention(s): Limited activity within patient's tolerance, Monitored during session, Premedicated before session, Repositioned     PT Goals (current goals can now be found in the care plan section) Acute Rehab PT Goals Patient Stated Goal: none stated Progress towards PT goals:  Not progressing toward goals - comment    Frequency    7X/week        Co-evaluation     PT goals addressed during session: Mobility/safety with mobility;Balance;Proper use of DME        AM-PAC PT "6 Clicks" Mobility   Outcome Measure  Help needed turning from your back to your side while in a flat bed without using bedrails?: A Lot Help needed moving from lying on your back to sitting on the side of a flat bed without using bedrails?: Total Help needed moving to and from a bed to a chair (including a wheelchair)?: Total Help needed standing up from a chair using your arms (e.g., wheelchair or bedside chair)?: Total Help needed to walk in hospital room?: Total Help needed climbing 3-5 steps with a railing? : Total 6 Click Score: 7    End of Session   Activity Tolerance: Patient limited by pain Patient left: in bed;with call bell/phone within reach;with bed alarm set Nurse Communication: Mobility status;Patient requests pain meds;Precautions;Weight bearing status PT Visit Diagnosis: Other abnormalities of gait and mobility (R26.89);Muscle weakness (generalized) (M62.81);History of falling (Z91.81);Pain Pain - Right/Left: Right Pain - part of body: Hip     Time: 2130-8657 PT Time Calculation (min) (ACUTE ONLY): 18 min  Charges:    $Therapeutic Activity: 8-22 mins PT General Charges $$ ACUTE PT VISIT: 1 Visit                     Chester Costa PTA 01/21/24, 12:35 PM

## 2024-01-21 NOTE — Plan of Care (Signed)
  Problem: Education: Goal: Knowledge of General Education information will improve Description: Including pain rating scale, medication(s)/side effects and non-pharmacologic comfort measures Outcome: Progressing   Problem: Clinical Measurements: Goal: Ability to maintain clinical measurements within normal limits will improve Outcome: Progressing Goal: Cardiovascular complication will be avoided Outcome: Progressing   Problem: Activity: Goal: Risk for activity intolerance will decrease Outcome: Progressing   Problem: Nutrition: Goal: Adequate nutrition will be maintained Outcome: Progressing   Problem: Coping: Goal: Level of anxiety will decrease Outcome: Progressing   Problem: Elimination: Goal: Will not experience complications related to bowel motility Outcome: Progressing Goal: Will not experience complications related to urinary retention Outcome: Progressing   Problem: Pain Managment: Goal: General experience of comfort will improve and/or be controlled Outcome: Progressing   Problem: Safety: Goal: Ability to remain free from injury will improve Outcome: Progressing   Problem: Skin Integrity: Goal: Risk for impaired skin integrity will decrease Outcome: Progressing

## 2024-01-21 NOTE — Progress Notes (Signed)
 OT Cancellation Note  Patient Details Name: Nicole Ramsey MRN: [id number] DOB: August 13, 1927   Cancelled Treatment:    Reason Eval/Treat Not Completed: Pain limiting ability to participate. Pt grimacing in pain while in bed despite receiving pain meds recently. Of note, pt worked with PT earlier this date and required significant physical assist to EOB. OT will re-attempt as able.    Allisen Pidgeon L. Artis Buechele, OTR/L  01/21/24, 1:43 PM

## 2024-01-21 NOTE — Progress Notes (Signed)
 Progress Note   Patient: Nicole Ramsey:[id number] DOB: 1927-06-06 DOA: 01/16/2024     5 DOS: the patient was seen and examined on 01/21/2024   Brief hospital course: Nicole Ramsey is a 89 year old female with history of dementia, history of left thalamus lacunar infarct, hypertension, hypothyroid, GERD, who presents emergency department from home via EMS for chief concerns of a fall and complaints of right hip pain  Patient is admitted for ortho evaluation of displaced comminuted fracture right femur. S/p Reduction and internal fixation of displaced intertrochanteric right hip fracture with Biomet Affixis TFN nail 01/17/24. 01/19/24 Hb dropped to 6.7 a unit of PRBC transfused. She is awaiting rehab placement.  Assessment and Plan: * Displaced comminuted fracture of shaft of right femur, initial encounter for closed fracture [facility]) Orthopedic service follow up with Dr. Daun Epstein appreciated. S/p Reduction and internal fixation of displaced intertrochanteric right hip fracture with Biomet Affixis TFN nail 01/17/24. Continue pan control, percocet for severe pain ordered. PT/ OT follow up, encourage out of bed to chair. TOC working on placement.  Acute blood loss anemia Post op blood loss- Iron deficiency- iron supplements ordered. Hb stable at 8.4 s/p 1 unit PRBC 01/19/24. Monitor hb, watch for bleeding at surgical site.  Heart failure with preserved ejection fraction (HCC) 10/31/2020: Estimated ejection fraction 65 to 70%, grade 1 diastolic dysfunction. Not in acute exacerbation at this time.  Hypokalemia Mag normal. K improved with supplements.  Anxiety Home alprazolam  0.25 mg p.o. nightly as needed for anxiety, sleep ordered.  Hypertension Resumed amlodipine  10 mg daily, restart atenolol  50 mg p.o if BP elevated. Continue to monitor BP.  Orthostatic vitals normal.  Hypothyroid Continue Levothyroxine  50 mcg daily.  Depression, major, recurrent, moderate (HCC) Home citalopram   10 mg daily, quetiapine  25 mg nightly resumed.  Severe malnutrition Dietician on board. Encourage oral diet, supplements.     Out of bed to chair. Incentive spirometry. Nursing supportive care. Fall, aspiration precautions. Diet:  Diet Orders (From admission, onward)     Start     Ordered   01/18/24 0732  Diet regular Room service appropriate? Yes; Fluid consistency: Thin  Diet effective now       Question Answer Comment  Room service appropriate? Yes   Fluid consistency: Thin      01/18/24 0732           DVT prophylaxis: SCDs Start: 01/17/24 1305 Place TED hose Start: 01/17/24 1305  Level of care: Telemetry Medical   Code Status: Limited: Do not attempt resuscitation (DNR) -DNR-LIMITED -Do Not Intubate/DNI   Subjective: Patient is seen and examined today morning. She is more alert, awake. Complained of hip pain at surgical site. Asks for pain meds. Eating fair. Advised to work with PT, out of bed.  Physical Exam: Vitals:   01/20/24 0735 01/20/24 1554 01/21/24 0539 01/21/24 0810  BP: 136/80 (!) 138/53 132/61 128/62  Pulse: 72 97 82 78  Resp: 18 16  14   Temp: 98 F (36.7 C) 99.3 F (37.4 C) 98.8 F (37.1 C) 97.6 F (36.4 C)  TempSrc: Oral     SpO2: 96% 95% 96% 96%  Weight:      Height:        General - Elderly African American female, distress due to pain. HEENT - PERRLA, EOMI, atraumatic head, non tender sinuses. Lung - Clear, no rales, rhonchi, wheezes. Heart - S1, S2 heard, no murmurs, rubs, trace pedal edema. Abdomen - Soft, non tender, bowel sounds good Neuro -  Alert, awake and oriented x 3, non focal exam. Skin - Warm and dry. Right hip sutures intact.  Data Reviewed:      Latest Ref Rng & Units 01/21/2024    4:37 AM 01/20/2024    8:51 AM 01/19/2024    5:30 PM  CBC  WBC 4.0 - 10.5 K/uL 9.2  10.1    Hemoglobin 12.0 - 15.0 g/dL 8.4  8.5  8.7   Hematocrit 36.0 - 46.0 % 24.9  25.9  26.2   Platelets 150 - 400 K/uL 173  146        Latest Ref Rng  & Units 01/21/2024    4:37 AM 01/20/2024    8:51 AM 01/18/2024    4:03 AM  BMP  Glucose 70 - 99 mg/dL 409  811  914   BUN 8 - 23 mg/dL 15  13  21    Creatinine 0.44 - 1.00 mg/dL 7.82  9.56  2.13   Sodium 135 - 145 mmol/L 136  138  135   Potassium 3.5 - 5.1 mmol/L 3.8  3.8  4.8   Chloride 98 - 111 mmol/L 103  103  104   CO2 22 - 32 mmol/L 28  28  25    Calcium 8.9 - 10.3 mg/dL 8.5  8.6  8.7    No results found.   Family Communication: Discussed with patient, she understand and agree. All questions answered.  Disposition: Status is: Inpatient Remains inpatient appropriate because: post ortho procedure, pain control, SNF placement.  Planned Discharge Destination: Home with Home Health     Time spent: 38 minutes  Author: Aisha Hove, MD 01/21/2024 11:28 AM Secure chat 7am to 7pm For on call review [email or web address].

## 2024-01-21 NOTE — Progress Notes (Signed)
 Subjective: 4 Days Post-Op Procedure(s) (LRB): FIXATION, FRACTURE, INTERTROCHANTERIC, WITH INTRAMEDULLARY ROD (Right) Patient doing well this morning, still reports pain as moderate in the right thigh. Patient is well, and has had no acute complaints or problems.  Denies any CP, SOB, N/V, fevers or chills We will continue with PT today.  Plan is to go Skilled nursing facility after hospital stay.  Objective: Vital signs in last 24 hours: Temp:  [98 F (36.7 C)-99.3 F (37.4 C)] 98.8 F (37.1 C) (04/23 0539) Pulse Rate:  [72-97] 82 (04/23 0539) Resp:  [16-18] 16 (04/22 1554) BP: (132-138)/(53-80) 132/61 (04/23 0539) SpO2:  [95 %-96 %] 96 % (04/23 0539)  Intake/Output from previous day:  Intake/Output Summary (Last 24 hours) at 01/21/2024 0721 Last data filed at 01/21/2024 0450 Gross per 24 hour  Intake --  Output 1300 ml  Net -1300 ml    Intake/Output this shift: No intake/output data recorded.  Labs: Recent Labs    01/18/24 1637 01/19/24 0341 01/19/24 1730 01/20/24 0851 01/21/24 0437  HGB 7.3* 6.7* 8.7* 8.5* 8.4*   Recent Labs    01/20/24 0851 01/21/24 0437  WBC 10.1 9.2  RBC 2.95* 2.86*  HCT 25.9* 24.9*  PLT 146* 173   Recent Labs    01/20/24 0851 01/21/24 0437  NA 138 136  K 3.8 3.8  CL 103 103  CO2 28 28  BUN 13 15  CREATININE 0.89 0.76  GLUCOSE 117* 112*  CALCIUM 8.6* 8.5*   No results for input(s): "LABPT", "INR" in the last 72 hours.  EXAM General - Patient is Alert and Oriented Extremity - Neurologically intact Neurovascular intact Sensation intact distally Intact pulses distally Dorsiflexion/Plantar flexion intact No cellulitis present Compartment soft - right thigh is soft to palpation. Dressing - dressing C/D/I and no drainage Motor Function - intact, moving foot and toes well on exam.  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   Weight as of this encounter: 66.3 kg. Advance diet Up with therapy  VSS.  Labs reviewed. S/P 1 unit of PRBC 4/21.  Hg up to 8.4 today. Continue with PT.  Plan for d/c to SNF pending progress with PT. Still reporting moderate pain in the right thigh.  Adjusted Oxycodone  and tylenol  dose this AM. CM to assist with discharge planning.  Staples can be removed by SNF on 01/31/24. Follow-up with [facility] orthopaedics in 6 weeks for x-rays of the right femur.  DVT Prophylaxis: TEDs SCDs  Antoine Bathe, PA-C, CAQ-OS [facility] 01/21/2024, 7:21 AM

## 2024-01-22 DIAGNOSIS — E876 Hypokalemia: Secondary | ICD-10-CM | POA: Diagnosis not present

## 2024-01-22 DIAGNOSIS — S728X2A Other fracture of left femur, initial encounter for closed fracture: Secondary | ICD-10-CM | POA: Diagnosis not present

## 2024-01-22 DIAGNOSIS — E039 Hypothyroidism, unspecified: Secondary | ICD-10-CM | POA: Diagnosis not present

## 2024-01-22 DIAGNOSIS — S72351A Displaced comminuted fracture of shaft of right femur, initial encounter for closed fracture: Secondary | ICD-10-CM | POA: Diagnosis not present

## 2024-01-22 LAB — BASIC METABOLIC PANEL WITH GFR
Anion gap: 8 (ref 5–15)
BUN: 14 mg/dL (ref 8–23)
CO2: 26 mmol/L (ref 22–32)
Calcium: 8.8 mg/dL — ABNORMAL LOW (ref 8.9–10.3)
Chloride: 99 mmol/L (ref 98–111)
Creatinine, Ser: 0.93 mg/dL (ref 0.44–1.00)
GFR, Estimated: 57 mL/min — ABNORMAL LOW (ref >60)
Glucose, Bld: 126 mg/dL — ABNORMAL HIGH (ref 70–99)
Potassium: 4.3 mmol/L (ref 3.5–5.1)
Sodium: 133 mmol/L — ABNORMAL LOW (ref 135–145)

## 2024-01-22 LAB — CBC
HCT: 25.5 % — ABNORMAL LOW (ref 36.0–46.0)
Hemoglobin: 8.7 g/dL — ABNORMAL LOW (ref 12.0–15.0)
MCH: 29.6 pg (ref 26.0–34.0)
MCHC: 34.1 g/dL (ref 30.0–36.0)
MCV: 86.7 fL (ref 80.0–100.0)
Platelets: 190 10*3/uL (ref 150–400)
RBC: 2.94 MIL/uL — ABNORMAL LOW (ref 3.87–5.11)
RDW: 15.4 % (ref 11.5–15.5)
WBC: 8.6 10*3/uL (ref 4.0–10.5)
nRBC: 0 % (ref 0.0–0.2)

## 2024-01-22 MED ORDER — ENOXAPARIN SODIUM 40 MG/0.4ML IJ SOSY
40.0000 mg | PREFILLED_SYRINGE | INTRAMUSCULAR | Status: DC
  Administered 2024-01-22 – 2024-01-27 (×5): 40 mg via SUBCUTANEOUS
  Filled 2024-01-22 (×6): qty 0.4

## 2024-01-22 MED ORDER — OXYCODONE-ACETAMINOPHEN 5-325 MG PO TABS
1.0000 | ORAL_TABLET | Freq: Four times a day (QID) | ORAL | 0 refills | Status: AC | PRN
Start: 2024-01-22 — End: ?

## 2024-01-22 MED ORDER — ENOXAPARIN SODIUM 40 MG/0.4ML IJ SOSY: 40.0000 mg | PREFILLED_SYRINGE | INTRAMUSCULAR | 0 refills | Status: DC

## 2024-01-22 NOTE — Progress Notes (Signed)
 Physical Therapy Treatment Patient Details Name: Nicole Ramsey MRN: [id number] DOB: Sep 07, 1927 Today's Date: 01/22/2024   History of Present Illness Pt is a 89 y.o. female presenting to hospital 01/16/24 s/p fall; c/o R hip pain.  Imaging showing comminuted and displaced/angulated fracture of the proximal right  femur.  S/p reduction and internal fixation of displaced intertrochanteric right hip fracture with Biomet Affixis TFN nail 01/17/24. PMH includes htn, thyroid  disease, chronic hip pain, dementia, L thalamus lacunar infarct.    PT Comments  Pt was long sitting in bed upon arrival. She is alert but disoriented x 2. She was pre-medicated 30 minutes prior to session and was able to perform/tolerate much more activity today than previous date. Pt was able to exit L side of bed, stand 2 x from EOB prior to taking several shuffling, antalgic steps to recliner. Did required max assist of one to stand from recliner. Acute PT will continue to follow and progress per current POC. DC recs remain appropriate to maximize her independence and safety with all ADLs.   If plan is discharge home, recommend the following: A lot of help with walking and/or transfers;A lot of help with bathing/dressing/bathroom;Assistance with cooking/housework;Direct supervision/assist for medications management;Direct supervision/assist for financial management;Assist for transportation;Help with stairs or ramp for entrance;Supervision due to cognitive status     Equipment Recommendations  Other (comment) (Defer to next level of care)       Precautions / Restrictions Precautions Precautions: Fall Recall of Precautions/Restrictions: Impaired Restrictions Weight Bearing Restrictions Per Provider Order: Yes RLE Weight Bearing Per Provider Order: Weight bearing as tolerated     Mobility  Bed Mobility Overal bed mobility: Needs Assistance Bed Mobility: Supine to Sit  Supine to sit: Max assist  General bed mobility  comments: max assist to exit L side of bed. pt demonstrated much less yelling/pain than previous date.    Transfers Overall transfer level: Needs assistance Equipment used: Rolling walker (2 wheels) Transfers: Sit to/from Stand Sit to Stand: Mod assist, From elevated surface, Max assist  General transfer comment: pt stood 2 x from EOB with mod assist prior to takign steps to recliner. max assist of one to stand from recliner    Ambulation/Gait Ambulation/Gait assistance: Mod assist, Max assist Gait Distance (Feet): 3 Feet Assistive device: Rolling walker (2 wheels) Gait Pattern/deviations: Step-to pattern, Antalgic Gait velocity: decreased  General Gait Details: Pt was able to take ~ 3 steps from EOB to recliner with vcs for sequencing and wt shift. Pt did clear floor with both R/L LE   Balance Overall balance assessment: Needs assistance Sitting-balance support: Bilateral upper extremity supported, Feet supported Sitting balance-Leahy Scale: Fair     Standing balance support: Bilateral upper extremity supported, During functional activity, Reliant on assistive device for balance Standing balance-Leahy Scale: Poor     Communication Communication Communication: Impaired (HOH) Factors Affecting Communication: Hearing impaired  Cognition Arousal: Alert Behavior During Therapy: Flat affect   PT - Cognitive impairments: History of cognitive impairments      PT - Cognition Comments: pt much less talkative and less pain limited. was pre-medicated 30 minutes prior to session Following commands: Intact Following commands impaired: Only follows one step commands consistently, Follows one step commands with increased time    Cueing Cueing Techniques: Verbal cues, Tactile cues     General Comments General comments (skin integrity, edema, etc.): Thigh dressing D/C/I pre/post session      Pertinent Vitals/Pain Pain Assessment Pain Assessment: 0-10 Pain Score: 6  Pain  Location: R  hip pain with any/all mobility Pain Descriptors / Indicators: Discomfort, Grimacing, Guarding, Tender, Moaning Pain Intervention(s): Limited activity within patient's tolerance, Monitored during session, Premedicated before session, Repositioned     PT Goals (current goals can now be found in the care plan section) Acute Rehab PT Goals Patient Stated Goal: none stated Progress towards PT goals: Progressing toward goals    Frequency    7X/week       Co-evaluation     PT goals addressed during session: Mobility/safety with mobility;Balance;Proper use of DME;Strengthening/ROM        AM-PAC PT "6 Clicks" Mobility   Outcome Measure  Help needed turning from your back to your side while in a flat bed without using bedrails?: A Lot Help needed moving from lying on your back to sitting on the side of a flat bed without using bedrails?: A Lot Help needed moving to and from a bed to a chair (including a wheelchair)?: A Lot Help needed standing up from a chair using your arms (e.g., wheelchair or bedside chair)?: A Lot Help needed to walk in hospital room?: A Lot Help needed climbing 3-5 steps with a railing? : Total 6 Click Score: 11    End of Session   Activity Tolerance: Patient tolerated treatment well Patient left: in chair;with call bell/phone within reach;with chair alarm set Nurse Communication: Mobility status;Patient requests pain meds;Precautions;Weight bearing status PT Visit Diagnosis: Other abnormalities of gait and mobility (R26.89);Muscle weakness (generalized) (M62.81);History of falling (Z91.81);Pain Pain - Right/Left: Right Pain - part of body: Hip     Time: 9604-5409 PT Time Calculation (min) (ACUTE ONLY): 17 min  Charges:    $Therapeutic Activity: 8-22 mins PT General Charges $$ ACUTE PT VISIT: 1 Visit                     Chester Costa PTA 01/22/24, 5:18 PM

## 2024-01-22 NOTE — Progress Notes (Addendum)
 Subjective: 5 Days Post-Op Procedure(s) (LRB): FIXATION, FRACTURE, INTERTROCHANTERIC, WITH INTRAMEDULLARY ROD (Right) Patient doing well this morning, still reports pain in the right thigh but improved compared to yesterday. Patient is well, and has had no acute complaints or problems.  Denies any CP, SOB, N/V, fevers or chills We will continue with PT today.  Plan is to go Skilled nursing facility after hospital stay.  Objective: Vital signs in last 24 hours: Temp:  [97.5 F (36.4 C)-99.2 F (37.3 C)] 99.2 F (37.3 C) (04/24 0522) Pulse Rate:  [78-88] 88 (04/24 0522) Resp:  [14-18] 18 (04/24 0522) BP: (115-144)/(41-62) 144/52 (04/24 0522) SpO2:  [90 %-96 %] 90 % (04/24 0522)  Intake/Output from previous day:  Intake/Output Summary (Last 24 hours) at 01/22/2024 0727 Last data filed at 01/22/2024 0500 Gross per 24 hour  Intake --  Output 2450 ml  Net -2450 ml    Intake/Output this shift: No intake/output data recorded.  Labs: Recent Labs    01/19/24 1730 01/20/24 0851 01/21/24 0437 01/22/24 0314  HGB 8.7* 8.5* 8.4* 8.7*   Recent Labs    01/21/24 0437 01/22/24 0314  WBC 9.2 8.6  RBC 2.86* 2.94*  HCT 24.9* 25.5*  PLT 173 190   Recent Labs    01/21/24 0437 01/22/24 0314  NA 136 133*  K 3.8 4.3  CL 103 99  CO2 28 26  BUN 15 14  CREATININE 0.76 0.93  GLUCOSE 112* 126*  CALCIUM 8.5* 8.8*   No results for input(s): "LABPT", "INR" in the last 72 hours.  EXAM General - Patient is Alert and Oriented Extremity - Neurologically intact Neurovascular intact Sensation intact distally Intact pulses distally Dorsiflexion/Plantar flexion intact No cellulitis present Compartment soft - right thigh is soft to palpation. Dressing - dressing C/D/I and no drainage Motor Function - intact, moving foot and toes well on exam.  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   Weight as of this encounter: 66.3 kg. Advance diet Up with therapy  VSS.  Labs reviewed. S/P 1 unit of PRBC 4/21.  Hg up to 8.7 today. Continue with PT.  Plan for d/c to SNF pending progress with PT. CM to assist with discharge planning. Will discuss with internal medicine if its appropriate to discharge on Lovenox  for DVT prophylaxis.  Staples can be removed by SNF on 01/31/24. Follow-up with [facility] orthopaedics in 6 weeks for x-rays of the right femur.  Orthopaedics will sign off at this time.  DVT Prophylaxis: TEDs SCDs  Antoine Bathe, PA-C, CAQ-OS [facility] 01/22/2024, 7:27 AM

## 2024-01-22 NOTE — Progress Notes (Signed)
 Occupational Therapy Treatment Patient Details Name: Nicole Ramsey MRN: [id number] DOB: May 21, 1927 Today's Date: 01/22/2024   History of present illness Pt is a 89 y.o. female presenting to hospital 01/16/24 s/p fall; c/o R hip pain.  Imaging showing comminuted and displaced/angulated fracture of the proximal right  femur.  S/p reduction and internal fixation of displaced intertrochanteric right hip fracture with Biomet Affixis TFN nail 01/17/24. PMH includes htn, thyroid  disease, chronic hip pain, dementia, L thalamus lacunar infarct.   OT comments  Pt seen for OT treatment on this date. Upon arrival to room pt seated in recliner, requiring encouragement to return to bed, agreeable to tx. Pt requires heavy MAXA +2 to stand pivot to bed. Pt locked her knees during transfer from the recliner to the bed. Required MAXA +2 to pivot into bed. Pt tolerated rolling L and R during sheet change. Pt reported her hip was hurting, RN notified to assess pt's pain. Pt assisted with phone to answer family call. Pt retired with all needs in reach, calm and comfortable. Pt making progress toward goals, will continue to follow POC. Discharge recommendation remains appropriate.        If plan is discharge home, recommend the following:  Two people to help with walking and/or transfers;A lot of help with bathing/dressing/bathroom;Direct supervision/assist for medications management;Supervision due to cognitive status;Direct supervision/assist for financial management;Assist for transportation;Assistance with cooking/housework;Help with stairs or ramp for entrance   Equipment Recommendations  Other (comment)    Recommendations for Other Services      Precautions / Restrictions Precautions Precautions: Fall Recall of Precautions/Restrictions: Impaired Restrictions Weight Bearing Restrictions Per Provider Order: No RLE Weight Bearing Per Provider Order: Weight bearing as tolerated       Mobility Bed  Mobility Overal bed mobility: Needs Assistance Bed Mobility: Sit to Supine     Supine to sit: +2 for physical assistance, Max assist     General bed mobility comments: Trunk and LE total Ax2 to sit on EOB and return to supine    Transfers Overall transfer level: Needs assistance Equipment used: Rolling walker (2 wheels) Transfers: Bed to chair/wheelchair/BSC, Sit to/from Stand Sit to Stand: +2 physical assistance, Max assist Stand pivot transfers: Max assist, +2 physical assistance         General transfer comment: Pt required MAXA +2 to stand for <30 seconds, pt required MAX+2 trunk/LE support and RW to get to EOB, unable to take steps     Balance Overall balance assessment: Needs assistance Sitting-balance support: Bilateral upper extremity supported, Feet supported Sitting balance-Leahy Scale: Poor Sitting balance - Comments: posterior lean due to pain Postural control: Posterior lean Standing balance support: Bilateral upper extremity supported, Reliant on assistive device for balance Standing balance-Leahy Scale: Zero                             ADL either performed or assessed with clinical judgement   ADL Overall ADL's : Needs assistance/impaired     Grooming: Wash/dry face;Bed level;Maximal assistance                   Toilet Transfer: Rolling walker (2 wheels);+2 for physical assistance;Maximal assistance;Stand-pivot (Simulated toilet transfer)           Functional mobility during ADLs: +2 for physical assistance;Rolling walker (2 wheels);Maximal assistance      Communication Communication Communication: No apparent difficulties   Cognition Arousal: Alert Behavior During Therapy: Anxious Cognition: No family/caregiver  present to determine baseline             OT - Cognition Comments: oriented to self                 Following commands: Intact Following commands impaired: Only follows one step commands consistently,  Follows one step commands with increased time      Cueing   Cueing Techniques: Verbal cues, Tactile cues  Exercises Exercises: Other exercises Other Exercises Other Exercises: Edu: Transfer hand placement sequencing, rolling at bed level    Shoulder Instructions       General Comments Thigh dressing D/C/I pre/post session    Pertinent Vitals/ Pain       Pain Assessment Pain Assessment: PAINAD Faces Pain Scale: Hurts whole lot Breathing: occasional labored breathing, short period of hyperventilation Negative Vocalization: occasional moan/groan, low speech, negative/disapproving quality Facial Expression: facial grimacing Body Language: rigid, fists clenched, knees up, pushing/pulling away, strikes out Consolability: unable to console, distract or reassure PAINAD Score: 8 Pain Location: R hip pain with any/all mobility Pain Descriptors / Indicators: Discomfort, Grimacing, Guarding, Tender, Moaning Pain Intervention(s): Limited activity within patient's tolerance, Monitored during session, Patient requesting pain meds-RN notified  Home Living                                          Prior Functioning/Environment              Frequency  Min 2X/week        Progress Toward Goals  OT Goals(current goals can now be found in the care plan section)  Progress towards OT goals: Progressing toward goals  Acute Rehab OT Goals Patient Stated Goal: have less pain OT Goal Formulation: With patient Time For Goal Achievement: 02/02/24 Potential to Achieve Goals: Good ADL Goals Pt Will Perform Grooming: sitting;with set-up;with supervision Pt Will Transfer to Toilet: bedside commode;with mod assist Pt Will Perform Toileting - Clothing Manipulation and hygiene: with modified independence;sitting/lateral leans  Plan      Co-evaluation                 AM-PAC OT "6 Clicks" Daily Activity     Outcome Measure   Help from another person eating meals?:  None Help from another person taking care of personal grooming?: A Little Help from another person toileting, which includes using toliet, bedpan, or urinal?: Total Help from another person bathing (including washing, rinsing, drying)?: A Lot Help from another person to put on and taking off regular upper body clothing?: A Lot Help from another person to put on and taking off regular lower body clothing?: A Lot 6 Click Score: 14    End of Session Equipment Utilized During Treatment: Rolling walker (2 wheels);Gait belt  OT Visit Diagnosis: Other abnormalities of gait and mobility (R26.89);Muscle weakness (generalized) (M62.81);Pain Pain - Right/Left: Right Pain - part of body: Hip;Leg   Activity Tolerance Patient limited by pain   Patient Left in bed;with call bell/phone within reach;with bed alarm set   Nurse Communication Patient requests pain meds;Mobility status        Time: 1505-1520 OT Time Calculation (min): 15 min  Charges: OT General Charges $OT Visit: 1 Visit OT Treatments $Therapeutic Activity: 8-22 mins  Rosaria Common M.S. OTR/L  01/22/24, 3:40 PM

## 2024-01-22 NOTE — TOC Progression Note (Addendum)
 Transition of Care [facility]) - Progression Note    Patient Details  Name: Nicole Ramsey MRN: [id number] Date of Birth: 12-May-1927  Transition of Care [facility]) CM/SW Contact  Crayton Docker, RN Phone Number: 01/22/2024, 4:05 PM  Clinical Narrative:      CM discussed case with Dr. Butch Cashing regarding discharge care planning. Hospital day 6 with diagnosis of Displaced comminuted fracture of shaft of right femur, initial encounter for closed fracture. Patient is s/p right hip repair. Patient discharge is pending SNF placement.  CM follow up call placed to patient's son, Aurea Leek, phone; [phone] regarding SNF preferences. Per patient's son, Aurea Leek, will call CM back.   CM return call to patient's son, Aurea Leek, regarding SNF preferences. Per patient's son, Ronelle, [facility] is preferred SNF. Per patient's son, [facility] is 2nd preferred SNF.  Expected Discharge Plan: Skilled Nursing Facility Barriers to Discharge: Continued Medical Work up  Expected Discharge Plan and Services    SNF    Social Determinants of Health (SDOH) Interventions SDOH Screenings   Food Insecurity: Patient Unable To Answer (01/16/2024)  Housing: Patient Unable To Answer (01/16/2024)  Transportation Needs: Patient Unable To Answer (01/16/2024)  Utilities: Patient Unable To Answer (01/16/2024)  Financial Resource Strain: Low Risk  (01/09/2024)   Received from [facility]  Social Connections: Patient Unable To Answer (01/16/2024)  Tobacco Use: Low Risk  (01/17/2024)  Recent Concern: Tobacco Use - Medium Risk (01/09/2024)   Received from [facility]    Readmission Risk Interventions     No data to display

## 2024-01-22 NOTE — Progress Notes (Signed)
 Progress Note   Patient: Nicole Ramsey ZOX:[id number] DOB: [birth year] DOA: 01/16/2024     6 DOS: the patient was seen and examined on 01/22/2024   Brief hospital course: Ms. Nicole Ramsey is a 89 year old female with history of dementia, history of left thalamus lacunar infarct, hypertension, hypothyroid, GERD, who presents emergency department from home via EMS for chief concerns of a fall and complaints of right hip pain  Patient is admitted for ortho evaluation of displaced comminuted fracture right femur. S/p Reduction and internal fixation of displaced intertrochanteric right hip fracture with Biomet Affixis TFN nail 01/17/24. 01/19/24 Hb dropped to 6.7 a unit of PRBC transfused. She is awaiting short term rehab placement.  Assessment and Plan: * Displaced comminuted fracture of shaft of right femur, initial encounter for closed fracture [facility]) Orthopedic service follow up appreciated. S/p Reduction and internal fixation of displaced intertrochanteric right hip fracture with Biomet Affixis TFN nail 01/17/24. Continue pan control, percocet for severe pain ordered. PT/ OT follow up advised STR, encourage out of bed to chair. TOC working on placement.  Acute blood loss anemia Post op blood loss- Iron deficiency- iron supplements ordered. Hb stable at 8.7 s/p 1 unit PRBC 01/19/24. Hb stable, will start Lovenox  [name] for DVT prophylaxis.  Heart failure with preserved ejection fraction (HCC) 10/31/2020: Estimated ejection fraction 65 to 70%, grade 1 diastolic dysfunction. Not in acute exacerbation at this time.  Hypokalemia K improved with supplements.  Anxiety Continue home alprazolam  0.25 mg p.o. nightly as needed for anxiety,sleep.  Hypertension Resumed amlodipine  10 mg daily, restarted atenolol  50 mg p.o if BP elevated. Continue to monitor BP.  Orthostatic vitals normal.  Hypothyroid Continue Levothyroxine  50 mcg daily.  Depression, major, recurrent, moderate (HCC) Continue home  citalopram  10 mg daily, quetiapine  25 mg nightly resumed.  Severe malnutrition Dietician on board. Encourage oral diet, supplements.     Out of bed to chair. Incentive spirometry. Nursing supportive care. Fall, aspiration precautions. Diet:  Diet Orders (From admission, onward)     Start     Ordered   01/18/24 0732  Diet regular Room service appropriate? Yes; Fluid consistency: Thin  Diet effective now       Question Answer Comment  Room service appropriate? Yes   Fluid consistency: Thin      01/18/24 0732           DVT prophylaxis: enoxaparin  (LOVENOX ) injection 40 mg Start: 01/22/24 1000 SCDs Start: 01/22/24 0740 SCDs Start: 01/17/24 1305 Place TED hose Start: 01/17/24 1305  Level of care: Telemetry Medical   Code Status: Limited: Do not attempt resuscitation (DNR) -DNR-LIMITED -Do Not Intubate/DNI   Subjective: Patient is seen and examined today morning. She is more alert, awake. Hip pain better. Wishes to work with PT.   Physical Exam: Vitals:   01/21/24 2029 01/22/24 0522 01/22/24 0825 01/22/24 1535  BP: (!) 120/41 (!) 144/52 (!) 138/55 132/64  Pulse: 82 88 94 97  Resp: 17 18 16 15   Temp: (!) 97.5 F (36.4 C) 99.2 F (37.3 C) 99 F (37.2 C) 97.8 F (36.6 C)  TempSrc:    Oral  SpO2: 94% 90% 94% 96%  Weight:      Height:        General - Elderly African American female, no acute distress. HEENT - PERRLA, EOMI, atraumatic head, non tender sinuses. Lung - Clear, no rales, rhonchi, wheezes. Heart - S1, S2 heard, no murmurs, rubs, trace pedal edema. Abdomen - Soft, non tender, bowel sounds good  Neuro - Alert, awake and oriented x 3, non focal exam. Skin - Warm and dry. Right hip sutures intact.  Data Reviewed:      Latest Ref Rng & Units 01/22/2024    3:14 AM 01/21/2024    4:37 AM 01/20/2024    8:51 AM  CBC  WBC 4.0 - 10.5 K/uL 8.6  9.2  10.1   Hemoglobin 12.0 - 15.0 g/dL 8.7  8.4  8.5   Hematocrit 36.0 - 46.0 % 25.5  24.9  25.9   Platelets 150 -  400 K/uL 190  173  146       Latest Ref Rng & Units 01/22/2024    3:14 AM 01/21/2024    4:37 AM 01/20/2024    8:51 AM  BMP  Glucose 70 - 99 mg/dL 638  756  433   BUN 8 - 23 mg/dL 14  15  13    Creatinine 0.44 - 1.00 mg/dL 2.95  1.88  4.16   Sodium 135 - 145 mmol/L 133  136  138   Potassium 3.5 - 5.1 mmol/L 4.3  3.8  3.8   Chloride 98 - 111 mmol/L 99  103  103   CO2 22 - 32 mmol/L 26  28  28    Calcium 8.9 - 10.3 mg/dL 8.8  8.5  8.6    No results found.   Family Communication: Discussed with patient, she understand and agree. All questions answered.  Disposition: Status is: Inpatient Remains inpatient appropriate because: post ortho procedure, pain control, SNF placement.  Planned Discharge Destination: Home with Home Health     Time spent: 37 minutes  Author: Aisha Hove, MD 01/22/2024 7:12 PM Secure chat 7am to 7pm For on call review [email or web address].

## 2024-01-22 NOTE — Plan of Care (Signed)
  Problem: Clinical Measurements: Goal: Diagnostic test results will improve Outcome: Progressing   Problem: Nutrition: Goal: Adequate nutrition will be maintained Outcome: Progressing   Problem: Coping: Goal: Level of anxiety will decrease Outcome: Progressing   Problem: Pain Managment: Goal: General experience of comfort will improve and/or be controlled Outcome: Progressing

## 2024-01-23 DIAGNOSIS — S728X2A Other fracture of left femur, initial encounter for closed fracture: Secondary | ICD-10-CM | POA: Diagnosis not present

## 2024-01-23 DIAGNOSIS — S72351A Displaced comminuted fracture of shaft of right femur, initial encounter for closed fracture: Secondary | ICD-10-CM | POA: Diagnosis not present

## 2024-01-23 DIAGNOSIS — E876 Hypokalemia: Secondary | ICD-10-CM | POA: Diagnosis not present

## 2024-01-23 DIAGNOSIS — E039 Hypothyroidism, unspecified: Secondary | ICD-10-CM | POA: Diagnosis not present

## 2024-01-23 NOTE — TOC Progression Note (Signed)
 Transition of Care [facility]) - Progression Note    Patient Details  Name: Nicole Ramsey MRN: [id number] Date of Birth: 1927-03-29  Transition [facility] Care [facility]) CM/SW Contact  Crayton Docker, RN 01/23/2024, 1:41 PM  Clinical Narrative:     CM follow up call placed to Admissions, [facility]. CM spoke to [place], [place] is extending bed offer and can accept on Monday 01/26/2024. CM initiated auth. Auth ID [id number] is pending.   Expected Discharge Plan: Skilled Nursing Facility Barriers to Discharge: Continued Medical Work up  Expected Discharge Plan and Services    SNF      Social Determinants of Health (SDOH) Interventions SDOH Screenings   Food Insecurity: Patient Unable To Answer (01/16/2024)  Housing: Patient Unable To Answer (01/16/2024)  Transportation Needs: Patient Unable To Answer (01/16/2024)  Utilities: Patient Unable To Answer (01/16/2024)  Financial Resource Strain: Low Risk  (01/09/2024)   Received from [facility]  Social Connections: Patient Unable To Answer (01/16/2024)  Tobacco Use: Low Risk  (01/17/2024)  Recent Concern: Tobacco Use - Medium Risk (01/09/2024)   Received from [facility]    Readmission Risk Interventions     No data to display

## 2024-01-23 NOTE — Plan of Care (Signed)
  Problem: Health Behavior/Discharge Planning: Goal: Ability to manage health-related needs will improve Outcome: Progressing   Problem: Elimination: Goal: Will not experience complications related to urinary retention Outcome: Progressing   Problem: Safety: Goal: Ability to remain free from injury will improve Outcome: Progressing   Problem: Skin Integrity: Goal: Risk for impaired skin integrity will decrease Outcome: Progressing   Problem: Pain Managment: Goal: General experience of comfort will improve and/or be controlled Outcome: Progressing

## 2024-01-23 NOTE — Progress Notes (Addendum)
 Progress Note   Patient: Nicole Ramsey ZOX:[id number] DOB: 1927-03-19 DOA: 01/16/2024     7 DOS: the patient was seen and examined on 01/23/2024   Brief hospital course: Ms. Nicole Ramsey is a 89 year old female with history of dementia, history of left thalamus lacunar infarct, hypertension, hypothyroid, GERD, who presents emergency department from home via EMS for chief concerns of a fall and complaints of right hip pain  Patient is admitted for ortho evaluation of displaced comminuted fracture right femur. S/p Reduction and internal fixation of displaced intertrochanteric right hip fracture with Biomet Affixis TFN nail 01/17/24. 01/19/24 Hb dropped to 6.7 a unit of PRBC transfused. She is awaiting short term rehab placement.  Assessment and Plan: * Displaced comminuted fracture of shaft of right femur, initial encounter for closed fracture [facility]) Orthopedic service follow up appreciated. S/p Reduction and internal fixation of displaced intertrochanteric right hip fracture with Biomet Affixis TFN nail 01/17/24. Ortho advised Lovenox  for DVT prophylaxis for 14 days, staples can be removed by SNF on 01/31/24. Follow-up with [facility] orthopaedics in 6 weeks for x-rays of the right femur. Continue pan control, percocet for severe pain ordered. PT/ OT follow up advised STR, encourage out of bed to chair.  TOC working on [employer], possibly can go to [facility] on Monday.  Acute blood loss anemia Post op blood loss- Iron deficiency- iron supplements ordered. Hb stable at 8.7 s/p 1 unit PRBC 01/19/24. Hb stable, started Lovenox  [name] for DVT prophylaxis.  Heart failure with preserved ejection fraction (HCC) 10/31/2020: Estimated ejection fraction 65 to 70%, grade 1 diastolic dysfunction. Not in acute exacerbation at this time.  Hypokalemia K improved with supplements.  Anxiety Continue home alprazolam  0.25 mg p.o. nightly as needed for anxiety,sleep.  Hypertension Resumed amlodipine  10 mg daily,  restarted atenolol  50 mg p.o if BP elevated. Continue to monitor BP.  Orthostatic vitals normal.  Hypothyroid Continue Levothyroxine  50 mcg daily.  Depression, major, recurrent, moderate (HCC) Continue home citalopram  10 mg daily, quetiapine  25 mg nightly resumed.  Severe malnutrition Dietician on board. Encourage oral diet, supplements.     Out of bed to chair. Incentive spirometry. Nursing supportive care. Fall, aspiration precautions. Diet:  Diet Orders (From admission, onward)     Start     Ordered   01/18/24 0732  Diet regular Room service appropriate? Yes; Fluid consistency: Thin  Diet effective now       Question Answer Comment  Room service appropriate? Yes   Fluid consistency: Thin      01/18/24 0732           DVT prophylaxis: enoxaparin  (LOVENOX ) injection 40 mg Start: 01/22/24 1000 SCDs Start: 01/22/24 0740 SCDs Start: 01/17/24 1305 Place TED hose Start: 01/17/24 1305  Level of care: Telemetry Medical   Code Status: Limited: Do not attempt resuscitation (DNR) -DNR-LIMITED -Do Not Intubate/DNI   Subjective: Patient is seen and examined today morning. She is able to talk slowly, more alert, able to work with PT. Eating fair.   Physical Exam: Vitals:   01/22/24 1535 01/22/24 1949 01/23/24 0346 01/23/24 0816  BP: 132/64 (!) 113/56 (!) 133/57 (!) 129/51  Pulse: 97 93 90 76  Resp: 15 17 17 16   Temp: 97.8 F (36.6 C) 98.5 F (36.9 C) 99 F (37.2 C) 99.3 F (37.4 C)  TempSrc: Oral Oral    SpO2: 96% 93% 93% 97%  Weight:      Height:        General - Elderly African American  female, no acute distress. HEENT - PERRLA, EOMI, atraumatic head, non tender sinuses. Lung - Clear, no rales, rhonchi, wheezes. Heart - S1, S2 heard, no murmurs, rubs, trace pedal edema. Abdomen - Soft, non tender, bowel sounds good Neuro - Alert, awake and oriented x 3, non focal exam. Skin - Warm and dry. Right hip sutures intact.  Data Reviewed:      Latest Ref Rng &  Units 01/22/2024    3:14 AM 01/21/2024    4:37 AM 01/20/2024    8:51 AM  CBC  WBC 4.0 - 10.5 K/uL 8.6  9.2  10.1   Hemoglobin 12.0 - 15.0 g/dL 8.7  8.4  8.5   Hematocrit 36.0 - 46.0 % 25.5  24.9  25.9   Platelets 150 - 400 K/uL 190  173  146       Latest Ref Rng & Units 01/22/2024    3:14 AM 01/21/2024    4:37 AM 01/20/2024    8:51 AM  BMP  Glucose 70 - 99 mg/dL 213  086  578   BUN 8 - 23 mg/dL 14  15  13    Creatinine 0.44 - 1.00 mg/dL 4.69  6.29  5.28   Sodium 135 - 145 mmol/L 133  136  138   Potassium 3.5 - 5.1 mmol/L 4.3  3.8  3.8   Chloride 98 - 111 mmol/L 99  103  103   CO2 22 - 32 mmol/L 26  28  28    Calcium 8.9 - 10.3 mg/dL 8.8  8.5  8.6    No results found.   Family Communication: Discussed with patient, she understand and agree. All questions answered.  Disposition: Status is: Inpatient Remains inpatient appropriate because: post ortho procedure, pain control, SNF placement.  Planned Discharge Destination: Rehab, pending insurance auth, possibly Monday to [facility].      Time spent: 37 minutes  Author: Aisha Hove, MD 01/23/2024 8:32 AM Secure chat 7am to 7pm For on call review [email or web address]

## 2024-01-23 NOTE — Progress Notes (Signed)
 Physical Therapy Treatment Patient Details Name: Nicole Ramsey MRN: [id number] DOB: 08/03/1927 Today's Date: 01/23/2024   History of Present Illness Pt is a 89 y.o. female presenting to hospital 01/16/24 s/p fall; c/o R hip pain.  Imaging showing comminuted and displaced/angulated fracture of the proximal right  femur.  S/p reduction and internal fixation of displaced intertrochanteric right hip fracture with Biomet Affixis TFN nail 01/17/24. PMH includes htn, thyroid  disease, chronic hip pain, dementia, L thalamus lacunar infarct.    PT Comments  Pt was asleep upon arrival. Easily awakes and is alert throughout the remainder of session. Pt is only truly oriented to self. " We are at my house right?" Pt pleasantly confused but cooperative. Max assist required to achieve EOB sitting. Stood with extensive vcs and assistance from elevated bed height to RW. Pt was in much more severe pain today versus previous date. Elected not to have pt transfer to recliner due to pt's pain and more mobility. Acute PT will continue to follow and progress per current POC. Dc recs remain appropriate.    If plan is discharge home, recommend the following: A lot of help with walking and/or transfers;A lot of help with bathing/dressing/bathroom;Assistance with cooking/housework;Direct supervision/assist for medications management;Direct supervision/assist for financial management;Assist for transportation;Help with stairs or ramp for entrance;Supervision due to cognitive status     Equipment Recommendations  Other (comment) (defer to next level of care)       Precautions / Restrictions Precautions Precautions: Fall Recall of Precautions/Restrictions: Impaired Restrictions Weight Bearing Restrictions Per Provider Order: Yes RLE Weight Bearing Per Provider Order: Weight bearing as tolerated     Mobility  Bed Mobility Overal bed mobility: Needs Assistance Bed Mobility: Supine to Sit  Supine to sit: Max assist,  HOB elevated, Used rails  General bed mobility comments: Increased time to achieve EOB sitting. pt very fearful and pain limited. more anxious/painful today versuys previous date    Transfers Overall transfer level: Needs assistance Equipment used: Rolling walker (2 wheels) Transfers: Sit to/from Stand Sit to Stand: Mod assist, Max assist, From elevated surface  General transfer comment: pt stood 3 x EOB with bed height elevated. tolerated standng for ~ 30 sec. encouraged to erect posture and increase wt bearig on RLE. pt struggles but was abl to lift R then LL in static standing. elected not to put pt in recliner due to difficulty with returning to bed previous date.    Ambulation/Gait  General Gait Details: unable    Balance Overall balance assessment: Needs assistance Sitting-balance support: Bilateral upper extremity supported, Feet supported Sitting balance-Leahy Scale: Fair     Standing balance support: Bilateral upper extremity supported, During functional activity, Reliant on assistive device for balance Standing balance-Leahy Scale: Poor Standing balance comment: pt remain high fall risk       Communication Communication Communication: Impaired  Cognition Arousal: Alert Behavior During Therapy: Flat affect   PT - Cognitive impairments: History of cognitive impairments    PT - Cognition Comments: Pt is A but disoriented x 3 Following commands: Intact      Cueing Cueing Techniques: Verbal cues, Tactile cues     General Comments General comments (skin integrity, edema, etc.): pt did not tolerate RLE exrcises well due to pain. RLE slightly internally rotated. MD in room and saw this( Dr Daun Epstein)      Pertinent Vitals/Pain Pain Assessment Pain Assessment: 0-10 Pain Score: 10-Worst pain ever Pain Location: R hip pain with any/all mobility Pain Descriptors / Indicators: Discomfort,  Grimacing, Guarding, Tender, Moaning Pain Intervention(s): Limited activity within  patient's tolerance, Monitored during session, Premedicated before session, Repositioned     PT Goals (current goals can now be found in the care plan section) Acute Rehab PT Goals Patient Stated Goal: none stated Progress towards PT goals: Progressing toward goals    Frequency    7X/week       Co-evaluation     PT goals addressed during session: Mobility/safety with mobility;Balance;Proper use of DME;Strengthening/ROM        AM-PAC PT "6 Clicks" Mobility   Outcome Measure  Help needed turning from your back to your side while in a flat bed without using bedrails?: A Lot Help needed moving from lying on your back to sitting on the side of a flat bed without using bedrails?: A Lot Help needed moving to and from a bed to a chair (including a wheelchair)?: A Lot Help needed standing up from a chair using your arms (e.g., wheelchair or bedside chair)?: A Lot Help needed to walk in hospital room?: A Lot Help needed climbing 3-5 steps with a railing? : Total 6 Click Score: 11    End of Session Equipment Utilized During Treatment: Gait belt Activity Tolerance: Patient tolerated treatment well Patient left: in bed;with bed alarm set;with call bell/phone within reach Nurse Communication: Mobility status PT Visit Diagnosis: Other abnormalities of gait and mobility (R26.89);Muscle weakness (generalized) (M62.81);History of falling (Z91.81);Pain Pain - Right/Left: Right Pain - part of body: Hip     Time: 0732-0800 PT Time Calculation (min) (ACUTE ONLY): 28 min  Charges:    $Therapeutic Activity: 23-37 mins PT General Charges $$ ACUTE PT VISIT: 1 Visit                    Chester Costa PTA 01/23/24, 9:18 AM

## 2024-01-24 DIAGNOSIS — S728X2A Other fracture of left femur, initial encounter for closed fracture: Secondary | ICD-10-CM | POA: Diagnosis not present

## 2024-01-24 DIAGNOSIS — E876 Hypokalemia: Secondary | ICD-10-CM | POA: Diagnosis not present

## 2024-01-24 DIAGNOSIS — E039 Hypothyroidism, unspecified: Secondary | ICD-10-CM | POA: Diagnosis not present

## 2024-01-24 DIAGNOSIS — S72351A Displaced comminuted fracture of shaft of right femur, initial encounter for closed fracture: Secondary | ICD-10-CM | POA: Diagnosis not present

## 2024-01-24 MED ORDER — OXYCODONE-ACETAMINOPHEN 5-325 MG PO TABS
1.0000 | ORAL_TABLET | Freq: Four times a day (QID) | ORAL | Status: DC | PRN
  Administered 2024-01-24 – 2024-01-25 (×3): 1 via ORAL
  Filled 2024-01-24 (×3): qty 1

## 2024-01-24 NOTE — Progress Notes (Signed)
 Progress Note Patient: Nicole Ramsey ZOX:[id number] DOB: 06-16-1927 DOA: 01/16/2024     8 DOS: the patient was seen and examined on 01/24/2024   Brief hospital course: Ms. Nicole Ramsey is a 89 year old female with history of dementia, left thalamus lacunar infarct, HTN, hypothyroid, GERD. They presented to ED from home via EMS for chief concerns of a fall and complaints of right hip pain. Patient is admitted for ortho evaluation of displaced comminuted fracture right femur. S/p ORIF 01/17/24. 01/19/24 Hb dropped to 6.7, a unit of PRBC transfused. Presently, she remains stable with mild R hip pain. Continues to work with PT who recommends SNF. TOC is engaged for placement which is expected 4/28.  Assessment and Plan: Displaced comminuted fracture of shaft of right femur- s/p ORIF 4/19 - ortho following - PT/OT - analgesia PRN - 14 days lovenox  DVT ppx - staples can be removed by SNF on 01/31/24. Follow-up with [facility] orthopaedics in 6 weeks for x-rays of the right femur.  Acute blood loss anemia- Post op blood loss. Hb stable at 8.7 s/p 1 unit PRBC 01/19/24. -iron supplements ordered.  Heart failure with preserved ejection fraction (HCC) 10/31/2020: Estimated ejection fraction 65 to 70%, grade 1 diastolic dysfunction. Not in acute exacerbation at this time.  Hypokalemia- resolved  Anxiety Continue home alprazolam  0.25 mg p.o. nightly as needed for anxiety,sleep.  Hypertension- with low diastolic pressure so err on the side of mild hypertension of systolics in order to avoid orthostatic symptoms - continue home amlodipine  10 mg daily  Hypothyroid Continue Levothyroxine  50 mcg daily.  Depression, major, recurrent, moderate (HCC) Continue home citalopram  10 mg daily, quetiapine  25 mg nightly resumed.  Severe malnutrition Dietician on board. Encourage oral diet, supplements.     Out of bed to chair. Incentive spirometry. Nursing supportive care. Fall, aspiration  precautions.  DVT prophylaxis: enoxaparin  (LOVENOX ) injection 40 mg Start: 01/22/24 1000 SCDs Start: 01/22/24 0740 SCDs Start: 01/17/24 1305 Place TED hose Start: 01/17/24 1305  Level of care: Telemetry Medical   Code Status: Limited: Do not attempt resuscitation (DNR) -DNR-LIMITED -Do Not Intubate/DNI   Subjective: pt reports pain in right hip. She requests tylenol . States that standing on it exacerbates pain. Denies abdominal pain, SOB, CP, nausea.   Physical Exam: Vitals:   01/23/24 0816 01/23/24 1505 01/23/24 2051 01/24/24 0433  BP: (!) 129/51 (!) 127/48 (!) 125/55 (!) 116/55  Pulse: 76 76 75 73  Resp: 16 15 18 16   Temp: 99.3 F (37.4 C) 97.8 F (36.6 C) 98.4 F (36.9 C) 98.6 F (37 C)  TempSrc:      SpO2: 97% 97% 94% 93%  Weight:      Height:       General - Elderly female, no acute distress. Lung - normal respiratory effort Heart -  peripheral perfusion intact MSK- positive edema and ecchymosis at surgical site. Wounds healing well without drainage or signs of infection.  Neuro - Alert, awake and oriented x 3, non focal exam. Skin - Warm and dry.   Data Reviewed:    Latest Ref Rng & Units 01/22/2024    3:14 AM 01/21/2024    4:37 AM 01/20/2024    8:51 AM  CBC  WBC 4.0 - 10.5 K/uL 8.6  9.2  10.1   Hemoglobin 12.0 - 15.0 g/dL 8.7  8.4  8.5   Hematocrit 36.0 - 46.0 % 25.5  24.9  25.9   Platelets 150 - 400 K/uL 190  173  146  Latest Ref Rng & Units 01/22/2024    3:14 AM 01/21/2024    4:37 AM 01/20/2024    8:51 AM  BMP  Glucose 70 - 99 mg/dL 161  096  045   BUN 8 - 23 mg/dL 14  15  13    Creatinine 0.44 - 1.00 mg/dL 4.09  8.11  9.14   Sodium 135 - 145 mmol/L 133  136  138   Potassium 3.5 - 5.1 mmol/L 4.3  3.8  3.8   Chloride 98 - 111 mmol/L 99  103  103   CO2 22 - 32 mmol/L 26  28  28    Calcium 8.9 - 10.3 mg/dL 8.8  8.5  8.6    No results found.  Family Communication: Discussed with patient, she understand and agree. All questions  answered.  Disposition: Status is: Inpatient Remains inpatient appropriate because: post ortho procedure, pain control, SNF placement.  Planned Discharge Destination: Rehab, pending insurance auth, [facility].   Time spent: 40 minutes  Author: Ree Candy, MD 01/24/2024 7:30 AM Secure chat 7am to 7pm For on call review [email or web address].

## 2024-01-24 NOTE — Progress Notes (Addendum)
 PT Cancellation Note  Patient Details Name: Nicole Ramsey MRN: [id number] DOB: 01-19-1927   Cancelled Treatment:     Author returned to assist pt with getting back to bed form recliner. PT stood pivot with max assist with author blocking knees to prevent buckling. Pt was less anxious with stand pivot back to bed but still remains very fearful of falling. Once back in bed, author assisted pt with lunch tray set up. She is able to I'ly feed herself once set up assist provided. Pt will benefit from continued skilled PT to maximize her independence while decreasing caregiver burden.    Koleen Perna 01/24/2024, 1:34 PM

## 2024-01-24 NOTE — Progress Notes (Signed)
 Physical Therapy Treatment Patient Details Name: Nicole Ramsey MRN: [id number] DOB: 02/14/1927 Today's Date: 01/24/2024   History of Present Illness Pt is a 89 y.o. female presenting to hospital 01/16/24 s/p fall; c/o R hip pain.  Imaging showing comminuted and displaced/angulated fracture of the proximal right  femur.  S/p reduction and internal fixation of displaced intertrochanteric right hip fracture with Biomet Affixis TFN nail 01/17/24. PMH includes htn, thyroid  disease, chronic hip pain, dementia, L thalamus lacunar infarct.    PT Comments  Pt was awake, long sitting in bed, upon arrival. She is oriented to self only but pleasantly confused. Pt required constant encouragement to fully participate. Overall progress has been limited due to pain + fear/anxiety. Pt yell out in anticipation of movements of RLE. She required +2 assistance to safely exit bed, stand, and take a few very antalgic shuffling steps from EOB to recliner. Author will return later this date to assist pt with getting back in bed. Dc recs remain appropriate   If plan is discharge home, recommend the following: A lot of help with walking and/or transfers;A lot of help with bathing/dressing/bathroom;Assistance with cooking/housework;Direct supervision/assist for medications management;Direct supervision/assist for financial management;Assist for transportation;Help with stairs or ramp for entrance;Supervision due to cognitive status     Equipment Recommendations   (Defer to next level of care)       Precautions / Restrictions Precautions Precautions: Fall Recall of Precautions/Restrictions: Impaired Restrictions Weight Bearing Restrictions Per Provider Order: Yes RLE Weight Bearing Per Provider Order: Weight bearing as tolerated     Mobility  Bed Mobility Overal bed mobility: Needs Assistance Bed Mobility: Supine to Sit, Sit to Supine  Supine to sit: HOB elevated, +2 for safety/equipment, +2 for physical  assistance, Max assist  General bed mobility comments: pt's very fearful of falling and yellout in pain with movements and even with anticipation of pain    Transfers Overall transfer level: Needs assistance Equipment used: None, Rolling walker (2 wheels) Transfers: Sit to/from Stand Sit to Stand: Max assist, +2 physical assistance, +2 safety/equipment  General transfer comment: pt's pain and fear of falling continue to greatly impact session progression . +2 assistance for safety to stand pivot to recliner from EOB    Ambulation/Gait  General Gait Details: very antalgic shuffling steps form EOB to recliner. constant vsc fo relaxation and sequencing   Balance Overall balance assessment: Needs assistance Sitting-balance support: Bilateral upper extremity supported, Feet supported Sitting balance-Leahy Scale: Fair Sitting balance - Comments: lateral lean away form RLE due to pain   Standing balance support: Bilateral upper extremity supported, During functional activity, Reliant on assistive device for balance Standing balance-Leahy Scale: Poor       Communication Communication Communication: Impaired  Cognition Arousal: Alert Behavior During Therapy: Flat affect   PT - Cognitive impairments: History of cognitive impairments    PT - Cognition Comments: Pt is A and O x 4. anxious with any/all movements yells out in pain Following commands: Intact Following commands impaired: Only follows one step commands consistently, Follows one step commands with increased time    Cueing Cueing Techniques: Verbal cues, Tactile cues     General Comments General comments (skin integrity, edema, etc.): pt was repositione din recline rpost session. will return later this date to assist pt with returning to bed form recliner      Pertinent Vitals/Pain Pain Assessment Pain Assessment: PAINAD Breathing: occasional labored breathing, short period of hyperventilation Negative Vocalization:  occasional moan/groan, low speech, negative/disapproving quality Facial  Expression: sad, frightened, frown Body Language: tense, distressed pacing, fidgeting Consolability: distracted or reassured by voice/touch PAINAD Score: 5 Pain Location: R hip pain with any/all mobility Pain Descriptors / Indicators: Discomfort, Grimacing, Guarding, Tender, Moaning Pain Intervention(s): Limited activity within patient's tolerance, Monitored during session, Premedicated before session, Repositioned, Ice applied     PT Goals (current goals can now be found in the care plan section) Acute Rehab PT Goals Patient Stated Goal: none stated Progress towards PT goals: Progressing toward goals    Frequency    7X/week           Co-evaluation     PT goals addressed during session: Mobility/safety with mobility;Balance;Proper use of DME;Strengthening/ROM        AM-PAC PT "6 Clicks" Mobility   Outcome Measure  Help needed turning from your back to your side while in a flat bed without using bedrails?: A Lot Help needed moving from lying on your back to sitting on the side of a flat bed without using bedrails?: A Lot Help needed moving to and from a bed to a chair (including a wheelchair)?: A Lot Help needed standing up from a chair using your arms (e.g., wheelchair or bedside chair)?: A Lot Help needed to walk in hospital room?: Total Help needed climbing 3-5 steps with a railing? : Total 6 Click Score: 10    End of Session   Activity Tolerance: Patient limited by pain;Other (comment) (limited by fear of falling+ pain) Patient left: in chair;with call bell/phone within reach;with chair alarm set;with nursing/sitter in room Nurse Communication: Mobility status PT Visit Diagnosis: Other abnormalities of gait and mobility (R26.89);Muscle weakness (generalized) (M62.81);History of falling (Z91.81);Pain Pain - Right/Left: Right Pain - part of body: Hip     Time: 5621-3086 PT Time Calculation  (min) (ACUTE ONLY): 20 min  Charges:    $Therapeutic Activity: 8-22 mins PT General Charges $$ ACUTE PT VISIT: 1 Visit                     Chester Costa PTA 01/24/24, 12:22 PM

## 2024-01-24 NOTE — Plan of Care (Signed)
  Problem: Health Behavior/Discharge Planning: Goal: Ability to manage health-related needs will improve Outcome: Progressing   Problem: Clinical Measurements: Goal: Will remain free from infection Outcome: Progressing   Problem: Pain Managment: Goal: General experience of comfort will improve and/or be controlled Outcome: Progressing   Problem: Skin Integrity: Goal: Risk for impaired skin integrity will decrease Outcome: Progressing

## 2024-01-25 DIAGNOSIS — S72351A Displaced comminuted fracture of shaft of right femur, initial encounter for closed fracture: Secondary | ICD-10-CM | POA: Diagnosis not present

## 2024-01-25 DIAGNOSIS — E039 Hypothyroidism, unspecified: Secondary | ICD-10-CM | POA: Diagnosis not present

## 2024-01-25 DIAGNOSIS — E876 Hypokalemia: Secondary | ICD-10-CM | POA: Diagnosis not present

## 2024-01-25 DIAGNOSIS — S728X2A Other fracture of left femur, initial encounter for closed fracture: Secondary | ICD-10-CM | POA: Diagnosis not present

## 2024-01-25 LAB — BASIC METABOLIC PANEL WITH GFR
Anion gap: 6 (ref 5–15)
BUN: 13 mg/dL (ref 8–23)
CO2: 25 mmol/L (ref 22–32)
Calcium: 8.6 mg/dL — ABNORMAL LOW (ref 8.9–10.3)
Chloride: 99 mmol/L (ref 98–111)
Creatinine, Ser: 0.9 mg/dL (ref 0.44–1.00)
GFR, Estimated: 59 mL/min — ABNORMAL LOW (ref >60)
Glucose, Bld: 145 mg/dL — ABNORMAL HIGH (ref 70–99)
Potassium: 3.9 mmol/L (ref 3.5–5.1)
Sodium: 130 mmol/L — ABNORMAL LOW (ref 135–145)

## 2024-01-25 LAB — CBC
HCT: 28.6 % — ABNORMAL LOW (ref 36.0–46.0)
Hemoglobin: 9.2 g/dL — ABNORMAL LOW (ref 12.0–15.0)
MCH: 29.2 pg (ref 26.0–34.0)
MCHC: 32.2 g/dL (ref 30.0–36.0)
MCV: 90.8 fL (ref 80.0–100.0)
Platelets: 311 10*3/uL (ref 150–400)
RBC: 3.15 MIL/uL — ABNORMAL LOW (ref 3.87–5.11)
RDW: 16.4 % — ABNORMAL HIGH (ref 11.5–15.5)
WBC: 10.3 10*3/uL (ref 4.0–10.5)
nRBC: 0 % (ref 0.0–0.2)

## 2024-01-25 MED ORDER — OXYCODONE HCL 5 MG PO TABS
5.0000 mg | ORAL_TABLET | Freq: Four times a day (QID) | ORAL | Status: DC | PRN
  Administered 2024-01-25 – 2024-01-27 (×5): 5 mg via ORAL
  Filled 2024-01-25 (×5): qty 1

## 2024-01-25 NOTE — Progress Notes (Signed)
 PT Cancellation Note  Patient Details Name: Nicole Ramsey MRN: [id number] DOB: 06/10/1927   Cancelled Treatment:    Reason Eval/Treat Not Completed: Fatigue/lethargy limiting ability to participate;Patient's level of consciousness. PT coordinated with pt's RN to pre-medicate pt prior to therapy session today. However, pt was very lethargic and unable to open her eyes for more than 1-2 seconds at a time to auditory and tactile stimuli. PT notified RN. PT will continue to follow-up with pt acutely as available and appropriate.    Leory Rands [name] 01/25/2024, 10:42 AM

## 2024-01-25 NOTE — Progress Notes (Signed)
 Progress Note Patient: Nicole Ramsey:[id number] DOB: [birth year] DOA: 01/16/2024     9 DOS: the patient was seen and examined on 01/25/2024   Brief hospital course: Ms. Nicole Ramsey is a 89 year old female with history of dementia, left thalamus lacunar infarct, HTN, hypothyroid, GERD. They presented to ED from home via EMS for chief concerns of a fall and complaints of right hip pain. Patient is admitted for ortho evaluation of displaced comminuted fracture right femur. S/p ORIF 01/17/24. 01/19/24 Hb dropped to 6.7, a unit of PRBC transfused. Presently, she remains stable with mild R hip pain. Continues to work with PT who recommends SNF. TOC is engaged for placement which is expected 4/28.  Assessment and Plan: Displaced comminuted fracture of shaft of right femur- s/p ORIF 4/19 - ortho following - PT/OT - analgesia PRN - 14 days lovenox  DVT ppx - staples can be removed by SNF on 01/31/24. Follow-up with [facility] orthopaedics in 6 weeks for x-rays of the right femur.  Voiding trial today. Foley in place since surgery or before is unclear.  - remove foley - bladder scan if no UOP   Acute blood loss anemia- Post op blood loss. Hb stable at 8.7 s/p 1 unit PRBC 01/19/24. -iron supplements ordered.  Heart failure with preserved ejection fraction (HCC) 10/31/2020: Estimated ejection fraction 65 to 70%, grade 1 diastolic dysfunction. Not in acute exacerbation at this time.  Hypokalemia- resolved  Anxiety Continue home alprazolam  0.25 mg p.o. nightly as needed for anxiety,sleep.  Hypertension- with low diastolic pressure so err on the side of mild hypertension of systolics in order to avoid orthostatic symptoms - continue home amlodipine  10 mg daily  Hypothyroid Continue Levothyroxine  50 mcg daily.  Depression, major, recurrent, moderate (HCC) Continue home citalopram  10 mg daily, quetiapine  25 mg nightly resumed.  Severe malnutrition Dietician on board. Encourage oral diet,  supplements.  Subjective: pt reports no concerns today. She is more tired that previous day  Physical Exam:Blood pressure (!) 148/57, pulse 86, temperature 98.7 F (37.1 C), resp. rate 16, height 5\' 7"  (1.702 m), weight 66.3 kg, SpO2 97%.  General - Elderly female, no acute distress. Lung - normal respiratory effort Heart -  peripheral perfusion intact MSK- positive edema and ecchymosis at surgical site. Wounds healing well without drainage or signs of infection. No tenderness to palpation today Neuro - Alert, awake and oriented x 3, non focal exam. Skin - Warm and dry.   Data Reviewed:    Latest Ref Rng & Units 01/22/2024    3:14 AM 01/21/2024    4:37 AM 01/20/2024    8:51 AM  CBC  WBC 4.0 - 10.5 K/uL 8.6  9.2  10.1   Hemoglobin 12.0 - 15.0 g/dL 8.7  8.4  8.5   Hematocrit 36.0 - 46.0 % 25.5  24.9  25.9   Platelets 150 - 400 K/uL 190  173  146       Latest Ref Rng & Units 01/22/2024    3:14 AM 01/21/2024    4:37 AM 01/20/2024    8:51 AM  BMP  Glucose 70 - 99 mg/dL 811  914  782   BUN 8 - 23 mg/dL 14  15  13    Creatinine 0.44 - 1.00 mg/dL 9.56  2.13  0.86   Sodium 135 - 145 mmol/L 133  136  138   Potassium 3.5 - 5.1 mmol/L 4.3  3.8  3.8   Chloride 98 - 111 mmol/L 99  103  103  CO2 22 - 32 mmol/L 26  28  28    Calcium 8.9 - 10.3 mg/dL 8.8  8.5  8.6    No results found.  Family Communication: none at bedside  Disposition: Status is: Inpatient Remains inpatient appropriate because: post ortho procedure, pain control, SNF placement.  Planned Discharge Destination: Rehab, pending insurance auth, [facility].   Time spent: 30 minutes  Author: Ree Candy, MD 01/25/2024 7:20 AM Secure chat 7am to 7pm For on call review [email or web address]

## 2024-01-26 DIAGNOSIS — E876 Hypokalemia: Secondary | ICD-10-CM | POA: Diagnosis not present

## 2024-01-26 DIAGNOSIS — S72351A Displaced comminuted fracture of shaft of right femur, initial encounter for closed fracture: Secondary | ICD-10-CM | POA: Diagnosis not present

## 2024-01-26 DIAGNOSIS — S728X2A Other fracture of left femur, initial encounter for closed fracture: Secondary | ICD-10-CM | POA: Diagnosis not present

## 2024-01-26 DIAGNOSIS — E039 Hypothyroidism, unspecified: Secondary | ICD-10-CM | POA: Diagnosis not present

## 2024-01-26 MED ORDER — ALPRAZOLAM 0.25 MG PO TABS: 0.2500 mg | ORAL_TABLET | Freq: Every evening | ORAL | 0 refills | Status: AC | PRN

## 2024-01-26 NOTE — Progress Notes (Signed)
 Physical Therapy Treatment Patient Details Name: LOWANDA GILNER MRN: [id number] DOB: 06-06-1927 Today's Date: 01/26/2024   History of Present Illness Pt is a 89 y.o. female presenting to hospital 01/16/24 s/p fall; c/o R hip pain.  Imaging showing comminuted and displaced/angulated fracture of the proximal right  femur.  S/p reduction and internal fixation of displaced intertrochanteric right hip fracture with Biomet Affixis TFN nail 01/17/24. PMH includes htn, thyroid  disease, chronic hip pain, dementia, L thalamus lacunar infarct.    PT Comments  Pt resting in bed upon PT arrival; pt pleasant and agreeable to therapy; pt appearing anxious during session (regarding R LE pain with activity) but did fairly well with extra time and cueing; R hip pain reported with R LE movement but pt reporting minimal to no pain at rest beginning/end of session (pt received pain medication this morning already).  During session pt was max assist semi-supine to sitting EOB; min to mod assist x2 to stand from bed up to RW; and min assist x2 to take steps bed to recliner with RW use (pt antalgic with decreased stance time R LE).  Will continue to focus on strengthening, balance, and progressive functional mobility during hospitalization.    If plan is discharge home, recommend the following: A lot of help with walking and/or transfers;A lot of help with bathing/dressing/bathroom;Assistance with cooking/housework;Direct supervision/assist for medications management;Direct supervision/assist for financial management;Assist for transportation;Help with stairs or ramp for entrance;Supervision due to cognitive status   Can travel by private vehicle     No  Equipment Recommendations  Other (comment) (Defer to next level of care)    Recommendations for Other Services       Precautions / Restrictions Precautions Precautions: Fall Recall of Precautions/Restrictions: Impaired Restrictions Weight Bearing Restrictions Per  Provider Order: Yes RLE Weight Bearing Per Provider Order: Weight bearing as tolerated     Mobility  Bed Mobility Overal bed mobility: Needs Assistance Bed Mobility: Supine to Sit     Supine to sit: Max assist, HOB elevated, Used rails     General bed mobility comments: assist for trunk and B LE's; extra time to perform to minimize pain/discomfort    Transfers Overall transfer level: Needs assistance Equipment used: Rolling walker (2 wheels) Transfers: Sit to/from Stand Sit to Stand: Min assist, Mod assist, +2 physical assistance           General transfer comment: assist to stand and control descent sitting; vc's for technique    Ambulation/Gait Ambulation/Gait assistance: Min assist, +2 physical assistance Gait Distance (Feet): 3 Feet (bed to recliner) Assistive device: Rolling walker (2 wheels) Gait Pattern/deviations: Step-to pattern, Antalgic Gait velocity: decreased     General Gait Details: antalgic; vc's for taking steps; assist for walker management   Stairs             Wheelchair Mobility     Tilt Bed    Modified Rankin (Stroke Patients Only)       Balance Overall balance assessment: Needs assistance Sitting-balance support: Bilateral upper extremity supported, Feet supported Sitting balance-Leahy Scale: Fair Sitting balance - Comments: pt leaning towards L d/t R hip pain Postural control: Left lateral lean Standing balance support: Bilateral upper extremity supported, During functional activity, Reliant on assistive device for balance Standing balance-Leahy Scale: Poor Standing balance comment: assist for standing with RW use                            Communication  Communication Communication: Impaired Factors Affecting Communication: Hearing impaired  Cognition Arousal: Alert Behavior During Therapy: Flat affect, Anxious   PT - Cognitive impairments: History of cognitive impairments                        PT - Cognition Comments: A&Ox4; anxious regarding any/all movement Following commands: Impaired Following commands impaired: Only follows one step commands consistently, Follows one step commands with increased time    Cueing Cueing Techniques: Verbal cues, Tactile cues  Exercises      General Comments        Pertinent Vitals/Pain Pain Assessment Pain Assessment: Faces Faces Pain Scale: Hurts a little bit (2/10 at rest; 6/10 with activity) Pain Location: R hip pain Pain Descriptors / Indicators: Discomfort, Grimacing, Guarding, Tender Pain Intervention(s): Limited activity within patient's tolerance, Monitored during session, Premedicated before session HR 83-85 bpm and SpO2 sats >94% on room air beginning/end of session at rest.    Home Living                          Prior Function            PT Goals (current goals can now be found in the care plan section) Acute Rehab PT Goals Patient Stated Goal: none stated PT Goal Formulation: With patient Time For Goal Achievement: 02/01/24 Potential to Achieve Goals: Good Progress towards PT goals: Progressing toward goals    Frequency    7X/week      PT Plan      Co-evaluation              AM-PAC PT "6 Clicks" Mobility   Outcome Measure  Help needed turning from your back to your side while in a flat bed without using bedrails?: A Lot Help needed moving from lying on your back to sitting on the side of a flat bed without using bedrails?: A Lot Help needed moving to and from a bed to a chair (including a wheelchair)?: A Lot Help needed standing up from a chair using your arms (e.g., wheelchair or bedside chair)?: A Lot Help needed to walk in hospital room?: Total Help needed climbing 3-5 steps with a railing? : Total 6 Click Score: 10    End of Session Equipment Utilized During Treatment: Gait belt Activity Tolerance: Patient tolerated treatment well Patient left: in chair;with call bell/phone  within reach;with chair alarm set Nurse Communication: Mobility status;Precautions PT Visit Diagnosis: Other abnormalities of gait and mobility (R26.89);Muscle weakness (generalized) (M62.81);History of falling (Z91.81);Pain Pain - Right/Left: Right Pain - part of body: Hip     Time: 1610-9604 PT Time Calculation (min) (ACUTE ONLY): 16 min  Charges:    $Therapeutic Activity: 8-22 mins PT General Charges $$ ACUTE PT VISIT: 1 Visit                     Amador Junes, PT 01/26/24, 9:32 AM

## 2024-01-26 NOTE — TOC Progression Note (Signed)
 Transition of Care [facility]) - Progression Note    Patient Details  Name: Nicole Ramsey MRN: [id number] Date of Birth: 05-07-1927  Transition of Care [facility]) CM/SW Contact  Crayton Docker, RN 01/26/2024, 1:06 PM  Clinical Narrative:     The above named patient is recommended to go to Short Term Rehab for strengthening and gait training for balance.  It is expected that the Short Term Rehab stay will be less than 30 days.  The patient is expected to return home after Rehab.   Expected Discharge Plan: Skilled Nursing Facility Barriers to Discharge: Continued Medical Work up  Expected Discharge Plan and Services      SNF     Social Determinants of Health (SDOH) Interventions SDOH Screenings   Food Insecurity: Patient Unable To Answer (01/16/2024)  Housing: Patient Unable To Answer (01/16/2024)  Transportation Needs: Patient Unable To Answer (01/16/2024)  Utilities: Patient Unable To Answer (01/16/2024)  Financial Resource Strain: Low Risk  (01/09/2024)   Received from [facility]  Social Connections: Patient Unable To Answer (01/16/2024)  Tobacco Use: Low Risk  (01/17/2024)  Recent Concern: Tobacco Use - Medium Risk (01/09/2024)   Received from [facility]    Readmission Risk Interventions     No data to display

## 2024-01-26 NOTE — Progress Notes (Signed)
 Occupational Therapy Treatment Patient Details Name: Nicole Ramsey MRN: [id number] DOB: 1927-04-24 Today's Date: 01/26/2024   History of present illness Pt is a 89 y.o. female presenting to hospital 01/16/24 s/p fall; c/o R hip pain.  Imaging showing comminuted and displaced/angulated fracture of the proximal right  femur.  S/p reduction and internal fixation of displaced intertrochanteric right hip fracture with Biomet Affixis TFN nail 01/17/24. PMH includes htn, thyroid  disease, chronic hip pain, dementia, L thalamus lacunar infarct.   OT comments  Pt seen for OT tx session, limited by pt's pain and appears anxious requiring emotional support and reassurance. Redirects easily to grooming tasks with set up and supv to complete with 1 VC for sequencing. Pt expressed appreciation afterwards and appeared much more at ease. Pt declined standing/mobility efforts 2/2 pain. RN in for pain meds and further ADL deferred. Pt continues to benefit.       If plan is discharge home, recommend the following:  Two people to help with walking and/or transfers;A lot of help with bathing/dressing/bathroom;Direct supervision/assist for medications management;Supervision due to cognitive status;Direct supervision/assist for financial management;Assist for transportation;Assistance with cooking/housework;Help with stairs or ramp for entrance   Equipment Recommendations  Other (comment) (defer)    Recommendations for Other Services      Precautions / Restrictions Precautions Precautions: Fall Recall of Precautions/Restrictions: Impaired Restrictions Weight Bearing Restrictions Per Provider Order: Yes RLE Weight Bearing Per Provider Order: Weight bearing as tolerated       Mobility Bed Mobility                    Transfers                   General transfer comment: Pt declined to stand from recliner 2/2 pain     Balance Overall balance assessment: Needs assistance Sitting-balance  support: Single extremity supported, Feet supported Sitting balance-Leahy Scale: Good                                     ADL either performed or assessed with clinical judgement   ADL Overall ADL's : Needs assistance/impaired     Grooming: Sitting;Set up;Supervision/safety;Wash/dry face;Wash/dry hands;Oral care Grooming Details (indicate cue type and reason): VC to initiate                                    Extremity/Trunk Assessment              Vision       Perception     Praxis     Communication Communication Communication: Impaired Factors Affecting Communication: Hearing impaired   Cognition Arousal: Alert Behavior During Therapy: Flat affect, Anxious Cognition: No family/caregiver present to determine baseline             OT - Cognition Comments: oriented to self, VC to redirect from worry/anxiety                 Following commands: Impaired Following commands impaired: Only follows one step commands consistently, Follows one step commands with increased time      Cueing   Cueing Techniques: Verbal cues, Tactile cues  Exercises Other Exercises Other Exercises: emotional support and redirection    Shoulder Instructions       General Comments      Pertinent Vitals/ Pain  Pain Assessment Pain Assessment: 0-10 Pain Score: 10-Worst pain ever Pain Location: R hip pain Pain Descriptors / Indicators: Discomfort, Grimacing, Guarding, Tender Pain Intervention(s): Limited activity within patient's tolerance, Monitored during session, Patient requesting pain meds-RN notified  Home Living                                          Prior Functioning/Environment              Frequency  Min 2X/week        Progress Toward Goals  OT Goals(current goals can now be found in the care plan section)  Progress towards OT goals: Progressing toward goals  Acute Rehab OT Goals Patient  Stated Goal: have less pain OT Goal Formulation: With patient Time For Goal Achievement: 02/02/24 Potential to Achieve Goals: Good  Plan      Co-evaluation                 AM-PAC OT "6 Clicks" Daily Activity     Outcome Measure   Help from another person eating meals?: None Help from another person taking care of personal grooming?: A Little Help from another person toileting, which includes using toliet, bedpan, or urinal?: Total Help from another person bathing (including washing, rinsing, drying)?: A Lot Help from another person to put on and taking off regular upper body clothing?: A Lot Help from another person to put on and taking off regular lower body clothing?: A Lot 6 Click Score: 14    End of Session    OT Visit Diagnosis: Other abnormalities of gait and mobility (R26.89);Muscle weakness (generalized) (M62.81);Pain Pain - Right/Left: Right Pain - part of body: Hip;Leg   Activity Tolerance Patient limited by pain   Patient Left in chair;with call bell/phone within reach;with chair alarm set;with nursing/sitter in room   Nurse Communication Patient requests pain meds;Mobility status;Other (comment) (nausea)        Time: 9811-9147 OT Time Calculation (min): 9 min  Charges: OT General Charges $OT Visit: 1 Visit OT Treatments $Self Care/Home Management : 8-22 mins  Berenda Breaker., MPH, MS, OTR/L ascom [phone] 01/26/24, 1:38 PM

## 2024-01-26 NOTE — Progress Notes (Signed)
 Progress Note Patient: Nicole Ramsey UJW:[id number] DOB: Oct 09, 1926 DOA: 01/16/2024     10 DOS: the patient was seen and examined on 01/26/2024   Brief hospital course: Ms. Nicole Ramsey is a 89 year old female with history of dementia, left thalamus lacunar infarct, HTN, hypothyroid, GERD. They presented to ED from home via EMS for chief concerns of a fall and complaints of right hip pain. Patient is admitted for ortho evaluation of displaced comminuted fracture right femur. S/p ORIF 01/17/24. 01/19/24 Hb dropped to 6.7, a unit of PRBC transfused. Presently, she remains stable with mild R hip pain. Continues to work with PT who recommends SNF. TOC is engaged for placement which is expected 4/28. No update on placement approval.   Assessment and Plan: Displaced comminuted fracture of shaft of right femur- s/p ORIF 4/19 - ortho following - PT/OT - analgesia PRN - 14 days lovenox  DVT ppx - staples can be removed by SNF on 01/31/24. Follow-up with [facility] orthopaedics in 6 weeks for x-rays of the right femur.  Voiding trial today. Foley placed for surgery was removed 4/27. Replaced overnight due to bladder scan of 300-566mL but did not report if there was a trial to void prior to scan or not so unfortunately she was placed back on foley - outpatient urology follow up  Acute blood loss anemia- Post op blood loss. Hb stable at 8.7>9.2 s/p 1 unit PRBC 01/19/24. -iron supplements ordered.  Heart failure with preserved ejection fraction (HCC) 10/31/2020: Estimated ejection fraction 65 to 70%, grade 1 diastolic dysfunction. Not in acute exacerbation at this time.  Hypokalemia- resolved  Anxiety Continue home alprazolam  0.25 mg p.o. nightly as needed for anxiety,sleep.  Hypertension- with low diastolic pressure so err on the side of mild hypertension of systolics in order to avoid orthostatic symptoms - continue home amlodipine  10 mg daily  Hypothyroid Continue Levothyroxine  50 mcg  daily.  Depression, major, recurrent, moderate (HCC) Continue home citalopram  10 mg daily, quetiapine  25 mg nightly resumed.  Severe malnutrition Dietician on board. Encourage oral diet, supplements.  Subjective: pt reports no concerns today. She states her hip pain is mild. She says she is confused about why she is here and I described several times but she continued to say she was confused about why she's here.  Physical Exam:Blood pressure (!) 148/57, pulse 86, temperature 98.7 F (37.1 C), resp. rate 16, height 5\' 7"  (1.702 m), weight 66.3 kg, SpO2 97%.  General - Elderly female, no acute distress. Lung - normal respiratory effort Heart -  peripheral perfusion intact MSK- positive edema and ecchymosis at surgical site. Wounds healing well without drainage or signs of infection. No tenderness to palpation today Neuro - Alert, awake and oriented x1 (self), non focal exam. Skin - Warm and dry.   Data Reviewed:    Latest Ref Rng & Units 01/25/2024   12:24 PM 01/22/2024    3:14 AM 01/21/2024    4:37 AM  CBC  WBC 4.0 - 10.5 K/uL 10.3  8.6  9.2   Hemoglobin 12.0 - 15.0 g/dL 9.2  8.7  8.4   Hematocrit 36.0 - 46.0 % 28.6  25.5  24.9   Platelets 150 - 400 K/uL 311  190  173       Latest Ref Rng & Units 01/25/2024   12:24 PM 01/22/2024    3:14 AM 01/21/2024    4:37 AM  BMP  Glucose 70 - 99 mg/dL 562  130  865   BUN 8 - 23  mg/dL 13  14  15    Creatinine 0.44 - 1.00 mg/dL 4.09  8.11  9.14   Sodium 135 - 145 mmol/L 130  133  136   Potassium 3.5 - 5.1 mmol/L 3.9  4.3  3.8   Chloride 98 - 111 mmol/L 99  99  103   CO2 22 - 32 mmol/L 25  26  28    Calcium 8.9 - 10.3 mg/dL 8.6  8.8  8.5    No results found.  Family Communication: none at bedside  Disposition: Status is: Inpatient Remains inpatient appropriate because: post ortho procedure, pain control, SNF placement.  Planned Discharge Destination: Rehab, pending insurance auth, possibly [facility]   Time spent: 30  minutes  Author: Ree Candy, MD 01/26/2024 7:13 AM Secure chat 7am to 7pm For on call review [email or web address].

## 2024-01-26 NOTE — Plan of Care (Signed)
  Problem: Elimination: Goal: Will not experience complications related to urinary retention Outcome: Not Progressing  Foley placed back in due to retention. Pt didn't urinate throughout shift, q4 bladder scan was between 300- . Orders to place foley placed by Dr Vallarie Gauze.  Problem: Pain Managment: Goal: General experience of comfort will improve and/or be controlled Outcome: Not Progressing   Problem: Education: Goal: Knowledge of General Education information will improve Description: Including pain rating scale, medication(s)/side effects and non-pharmacologic comfort measures Outcome: Progressing   Problem: Health Behavior/Discharge Planning: Goal: Ability to manage health-related needs will improve Outcome: Progressing   Problem: Clinical Measurements: Goal: Ability to maintain clinical measurements within normal limits will improve Outcome: Progressing Goal: Will remain free from infection Outcome: Progressing Goal: Diagnostic test results will improve Outcome: Progressing Goal: Respiratory complications will improve Outcome: Progressing Goal: Cardiovascular complication will be avoided Outcome: Progressing   Problem: Activity: Goal: Risk for activity intolerance will decrease Outcome: Progressing   Problem: Nutrition: Goal: Adequate nutrition will be maintained Outcome: Progressing   Problem: Coping: Goal: Level of anxiety will decrease Outcome: Progressing   Problem: Elimination: Goal: Will not experience complications related to bowel motility Outcome: Progressing   Problem: Safety: Goal: Ability to remain free from injury will improve Outcome: Progressing   Problem: Skin Integrity: Goal: Risk for impaired skin integrity will decrease Outcome: Progressing

## 2024-01-26 NOTE — TOC Progression Note (Signed)
 Transition of Care [facility]) - Progression Note    Patient Details  Name: Nicole Ramsey MRN: [id number] Date of Birth: [birth year]  [facility]) CM/SW Contact  Crayton Docker, RN 01/26/2024, 10:42 AM  Clinical Narrative:      Call received from [facility], Admissions, [facility] regarding auth approval. Per CM, will follow up and update Gina regarding auth approval. CM follow up in [place] MUST regarding PASSR. Request for additional documentation for review. CM will fax requested documentation to [facility] MUST, fax [phone]. Expected Discharge Plan: Skilled Nursing Facility Barriers to Discharge: Continued Medical Work up  Expected Discharge Plan and Services    SNF   Social Determinants of Health (SDOH) Interventions SDOH Screenings   Food Insecurity: Patient Unable To Answer (01/16/2024)  Housing: Patient Unable To Answer (01/16/2024)  Transportation Needs: Patient Unable To Answer (01/16/2024)  Utilities: Patient Unable To Answer (01/16/2024)  Financial Resource Strain: Low Risk  (01/09/2024)   Received from [facility]  Social Connections: Patient Unable To Answer (01/16/2024)  Tobacco Use: Low Risk  (01/17/2024)  Recent Concern: Tobacco Use - Medium Risk (01/09/2024)   Received from [facility]    Readmission Risk Interventions     No data to display

## 2024-01-26 NOTE — Plan of Care (Signed)

## 2024-01-26 NOTE — TOC Progression Note (Addendum)
 Transition of Care [facility]) - Progression Note    Patient Details  Name: Nicole Ramsey MRN: [id number] Date of Birth: 11-May-1927  Transition of [facility]) CM/SW Contact  Crayton Docker, RN Phone Number: 01/26/2024, 11:43 AM  Clinical Narrative:     CM follow up in [place] MUST, request for additional documentation. CM will fax additional documentation to [facility] MUST, fax number [phone]. CM follow up in [place], Plan Auth ID [id number], Auth ID [id number], dates from 01/23/2024 to 01/26/2024. CM call to [place], phone: [phone] regarding validity period. CM spoke to [place]. Per Moises Ang is valid until 1159 on 01/27/2024.   CM faxed 30 day PASSR note to [place] MUST. CM will follow up.  CM follow up in [place] MUST, PASSR is [id number], expires 02/25/2024. CM follow up call placed to [facility], Admissions, [facility] regarding PASSR. Per Bonnell Butcher, SNF requests admission on 01/27/2024 at 1100 and discharge summary by 1000.  CM call placed to [facility] BLS transport. CM spoke to Ed. BLS transport scheduled for 1100 pick up on 01/27/2024. Expected Discharge Plan: Skilled Nursing Facility Barriers to Discharge: Continued Medical Work up  Expected Discharge Plan and Services    SNF    Social Determinants of Health (SDOH) Interventions SDOH Screenings   Food Insecurity: Patient Unable To Answer (01/16/2024)  Housing: Patient Unable To Answer (01/16/2024)  Transportation Needs: Patient Unable To Answer (01/16/2024)  Utilities: Patient Unable To Answer (01/16/2024)  Financial Resource Strain: Low Risk  (01/09/2024)   Received from [facility]  Social Connections: Patient Unable To Answer (01/16/2024)  Tobacco Use: Low Risk  (01/17/2024)  Recent Concern: Tobacco Use - Medium Risk (01/09/2024)   Received from [facility]    Readmission Risk Interventions     No data to display

## 2024-01-27 DIAGNOSIS — S72351A Displaced comminuted fracture of shaft of right femur, initial encounter for closed fracture: Secondary | ICD-10-CM | POA: Diagnosis not present

## 2024-01-27 MED ORDER — BISACODYL 10 MG RE SUPP: 10.0000 mg | Freq: Every day | RECTAL | Status: AC | PRN

## 2024-01-27 MED ORDER — MAGNESIUM HYDROXIDE 400 MG/5ML PO SUSP: 30.0000 mL | Freq: Every day | ORAL | Status: AC | PRN

## 2024-01-27 NOTE — Progress Notes (Signed)
 Patient discharging to SNF today. Report given to admitting nurse Amber, LPN. All questions and concerns addressed.

## 2024-01-27 NOTE — Plan of Care (Signed)

## 2024-01-27 NOTE — TOC Transition Note (Signed)
 Transition of Care [facility]) - Discharge Note   Patient Details  Name: Nicole Ramsey MRN: [id number] Date of Birth: 1927-07-24  Transition of Care [facility]) CM/SW Contact:  Crayton Docker, RN 01/27/2024, 9:51 AM   Clinical Narrative:     Patient will discharge to [facility] today via [facility] at 1100. CM alert to Dr. Alva Jewels regarding pending discharge. CM call to patient's son, Aurea Leek, phone: [phone] regarding pending discharge. No answer, CM left message for return call. CM call to patient's son, Reymundo Caulk, phone: [phone] regarding pending discharge. Per patient's son, Reymundo Caulk, verbalized understanding and gratitude. CM call to Bonnell Butcher, Admissions, [facility], phone: [phone] regarding pending discharge and BLS transport and Room assignment and RN report number. Per Bonnell Butcher, patient will go to room 708, Nursing station #3 and RN report number is [phone]. CM alert to RN Elona regarding RN report number and room assignment. CM call to [employer], phone: [phone] regarding confirmation of BLS transport time. CM spoke to Ed, BLS transport time confirmed for 1100.  Final next level of care: Skilled Nursing Facility Barriers to Discharge: No Barriers Identified   Patient Goals and CMS Choice    SNF   Discharge Placement     SNF           Discharge Plan and Services Additional resources added to the After Visit Summary for      SNF              Social Drivers of Health (SDOH) Interventions SDOH Screenings   Food Insecurity: Patient Unable To Answer (01/16/2024)  Housing: Patient Unable To Answer (01/16/2024)  Transportation Needs: Patient Unable To Answer (01/16/2024)  Utilities: Patient Unable To Answer (01/16/2024)  Financial Resource Strain: Low Risk  (01/09/2024)   [facility]  Social Connections: Patient Unable To Answer (01/16/2024)  Tobacco Use: Low Risk  (01/17/2024)  Recent Concern: Tobacco Use -  Medium Risk (01/09/2024)   Received from [facility]     Readmission Risk Interventions     No data to display

## 2024-01-27 NOTE — Discharge Summary (Signed)
 Physician Discharge Summary  Patient: Nicole Ramsey ZOX:[id number] DOB: 1926/10/12   Code Status: Limited: Do not attempt resuscitation (DNR) -DNR-LIMITED -Do Not Intubate/DNI  Admit date: 01/16/2024 Discharge date: 01/27/2024 Disposition: Skilled nursing facility, PT, OT, nurse aid, RN, and wound care PCP: Little Riff, MD  Recommendations for Outpatient Follow-up:  Follow up with PCP within 1-2 weeks Regarding general hospital follow up and preventative care Recommend CBC. Most recent hgb prior to dc was 9.2 Follow up with ortho.  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 They presented to ED from home via EMS for chief concerns of a fall and complaints of right hip pain. Found to have displaced comminuted fracture right femur. S/p ORIF 01/17/24. 01/19/24 Hb dropped to 6.7, a unit of PRBC transfused. Presently, she remains stable with mild R hip pain. Continues to work with PT who recommends SNF.   Post-op, she had acute urinary retention with a foley placed. A voiding trial was attempted but unfortunately a foley was replaced due to not voiding overnight. She will need to follow up with urology for repeat voiding trial outpatient.   BP medications were adjusted due to lower diastolic pressures and worry  of orthostatic symptoms while working with PT. She remained hemodynamically stable so changes were continued at discharge as listed below. Can be added back on as determined by PCP follow up.   All other chronic conditions were treated with home medications.    Discharge Condition: Good, improved Recommended discharge diet: Regular healthy diet  Consultations: Ortho surgery   Procedures/Studies: ORIF 4/19 for R hip fracture  Allergies as of 01/27/2024       Reactions   Aspirin Other (See Comments)   Elevated blood pressure        Medication List     STOP taking these medications    atenolol  50 MG tablet Commonly known as: TENORMIN    cyanocobalamin 1000 MCG/ML injection Commonly known as: VITAMIN B12   hydrALAZINE  25 MG tablet Commonly known as: APRESOLINE    Probiotic 1-250 BILLION-MG Caps       TAKE these medications    acetaminophen  500 MG tablet Commonly known as: TYLENOL  Take 1-2 tablets (500-1,000 mg total) by mouth every 6 (six) hours as needed for moderate pain (pain score 4-6).   ALPRAZolam  0.25 MG tablet Commonly known as: XANAX  Take 1 tablet (0.25 mg total) by mouth at bedtime as needed for up to 10 days for anxiety or sleep.   amLODipine  10 MG tablet Commonly known as: NORVASC  Take 10 mg by mouth daily.   bisacodyl  10 MG suppository Commonly known as: DULCOLAX Place 1 suppository (10 mg total) rectally daily as needed for moderate constipation.   citalopram  10 MG tablet Commonly known as: CELEXA  Take 10 mg by mouth daily.   enoxaparin  40 MG/0.4ML injection Commonly known as: LOVENOX  Inject  0.4 mLs (40 mg total) into the skin daily.   feeding supplement Liqd Take 237 mLs by mouth 2 (two) times daily between meals.   levothyroxine  50 MCG tablet Commonly known as: SYNTHROID  Take 50 mcg by mouth daily.   magnesium  hydroxide 400 MG/5ML suspension Commonly known as: MILK OF MAGNESIA Take 30 mLs by mouth daily as needed for mild  constipation.   meclizine  25 MG tablet Commonly known as: ANTIVERT  Take 25 mg by mouth 3 (three) times daily as needed for dizziness.   oxyCODONE -acetaminophen  5-325 MG tablet Commonly known as: PERCOCET/ROXICET Take 1-2 tablets by mouth every 6 (six) hours as needed for severe pain (pain score 7-10).   pantoprazole  40 MG tablet Commonly known as: PROTONIX  Take 1 tablet (40 mg total) by mouth 2 (two) times daily.   polyethylene glycol 17 g packet Commonly known as: MIRALAX  / GLYCOLAX  Take 17 g by mouth daily.   QUEtiapine  25 MG tablet Commonly known as: SEROQUEL  Take 25 mg by mouth at bedtime.   Vitamin D (Ergocalciferol) 1.25 MG (50000 UNIT) Caps capsule Commonly known as: DRISDOL Take 50,000 Units by mouth once a week.        Contact information for follow-up providers     Rojelio Clement, PA-C Follow up in 6 week(s).   Specialty: Physician Assistant Why: Len Quale can be removed by SNF on 01/31/24. Contact information: [address] [place] [address] [phone]              Contact information for after-discharge care     Destination     [facility] Preferred SNF .   Service: Skilled Nursing Contact information: [address] [facility]  [address] [phone]                     Subjective   Pt reports feeling well. Her hip pain is much improved. Denies complaints currently. Wants to know if her sister will be at rehab with her.   All questions and concerns were addressed at time of discharge.  Objective  Blood pressure (!) 140/53, pulse 69, temperature 98 F (36.7 C), temperature source Oral, resp. rate 16, height 5\' 7"  (1.702 m), weight 66.3 kg, SpO2 94%.   General: Pt is alert, awake, not in acute distress Cardiovascular: RRR, S1/S2 +, no rubs, no gallops Respiratory: CTA bilaterally, no wheezing, no rhonchi Abdominal: Soft, NT, ND, bowel sounds + Extremities: mild 1+ pitting edema  surrounding surgical site. Ecchymosis evolving appropriately in R groin without signs of hematoma. No erythema. No pain to palpation  The results of significant diagnostics from this hospitalization (including imaging, microbiology, ancillary and laboratory) are listed below for reference.   Imaging studies: DG HIP UNILAT WITH PELVIS 2-3 VIEWS RIGHT Result Date: 01/17/2024 CLINICAL DATA:  Elective surgery.  Right hip intramedullary nail. EXAM: DG HIP (WITH OR WITHOUT PELVIS) 2-3V RIGHT COMPARISON:  Pelvis and right hip radiographs 01/16/2024 FINDINGS: Images were performed intraoperatively without the presence of a radiologist. The patient is undergoing long cephalo medullary nail fixation of the previously seen comminuted, angulated, and displaced fracture of the right femoral intertrochanteric region and proximal diaphysis. Improved alignment. Total fluoroscopy images: 6 Total fluoroscopy time: 33 seconds Total dose: Radiation Exposure Index (as provided by the fluoroscopic device): 3.98 mGy air Kerma Please see intraoperative findings for further detail. IMPRESSION: Intraoperative fluoroscopy for right femoral fracture fixation. Electronically Signed   By: Bertina Broccoli M.D.   On: 01/17/2024 17:15  DG C-Arm 1-60 Min-No Report Result Date: 01/17/2024 Fluoroscopy was utilized by the requesting physician.  No radiographic interpretation.   CT HEAD WO CONTRAST ( ) Result Date: 01/16/2024 CLINICAL DATA:  Head trauma, minor. EXAM: CT HEAD WITHOUT CONTRAST TECHNIQUE: Contiguous axial images were obtained from the base of the skull through the vertex without intravenous contrast. RADIATION DOSE REDUCTION: This exam was performed according to the departmental dose-optimization program which includes automated exposure control, adjustment of the mA and/or kV according to patient size and/or use of iterative reconstruction technique. COMPARISON:  Head CT 07/07/2021.  MRI brain 07/06/2021. FINDINGS: Brain: No  acute hemorrhage. Stable background of moderate chronic small-vessel disease and old lacunar infarct in the left thalamus. Cortical gray-white differentiation is otherwise preserved. Prominence of the ventricles and sulci within expected range for age. No hydrocephalus or extra-axial collection. No mass effect or midline shift. Vascular: No hyperdense vessel or unexpected calcification. Skull: No calvarial fracture or suspicious bone lesion. Skull base is unremarkable. Sinuses/Orbits: No acute findings. Other: None. IMPRESSION: 1. No acute intracranial abnormality. 2. Stable background of moderate chronic small-vessel disease and old lacunar infarct in the left thalamus. Electronically Signed   By: Audra Blend M.D.   On: 01/16/2024 15:33   DG Chest 1 View Result Date: 01/16/2024 CLINICAL DATA:  Fall. EXAM: CHEST  1 VIEW COMPARISON:  12/08/2022. FINDINGS: Low lung volume. Mild prominence of interstitial markings is nonspecific and may be secondary to underlying age-related fibrosis/scarring. Bilateral lung fields are otherwise clear. No dense consolidation or lung collapse. Bilateral costophrenic angles are clear. Normal cardio-mediastinal silhouette. No acute osseous abnormalities. The soft tissues are within normal limits. IMPRESSION: No active disease. Electronically Signed   By: Beula Brunswick M.D.   On: 01/16/2024 14:35   DG HIP UNILAT W OR W/O PELVIS 2-3 VIEWS RIGHT Result Date: 01/16/2024 CLINICAL DATA:  Fall.  Right hip pain. EXAM: DG HIP (WITH OR WITHOUT PELVIS) 2-3V RIGHT COMPARISON:  None Available. FINDINGS: There is comminuted and displaced/angulated fracture of the proximal right femur. No other acute fracture or dislocation. No aggressive osseous lesion. Visualized sacral arcuate lines are unremarkable. Unremarkable symphysis pubis. There are mild-to-moderate degenerative changes of bilateral hip joints characterized by joint space narrowing and osteophytosis of the superior acetabulum. No  radiopaque foreign bodies. IMPRESSION: *Comminuted and displaced/angulated fracture of the proximal right femur. Electronically Signed   By: Beula Brunswick M.D.   On: 01/16/2024 14:34    Labs: Basic Metabolic Panel: Recent Labs  Lab 01/21/24 0437 01/22/24 0314 01/25/24 1224  NA 136 133* 130*  K 3.8 4.3 3.9  CL 103 99 99  CO2 28 26 25   GLUCOSE 112* 126* 145*  BUN 15 14 13   CREATININE 0.76 0.93 0.90  CALCIUM 8.5* 8.8* 8.6*   CBC: Recent Labs  Lab 01/21/24 0437 01/22/24 0314 01/25/24 1224  WBC 9.2 8.6 10.3  HGB 8.4* 8.7* 9.2*  HCT 24.9* 25.5* 28.6*  MCV 87.1 86.7 90.8  PLT 173 190 311   Microbiology: Results for orders placed or performed during the hospital encounter of 07/06/21  Resp Panel by RT-PCR (Flu A&B, Covid) Nasopharyngeal Swab     Status: None   Collection Time: 07/06/21 10:31 PM   Specimen: Nasopharyngeal Swab; Nasopharyngeal(NP) swabs in vial transport medium  Result Value Ref Range Status   SARS Coronavirus 2 by RT PCR NEGATIVE NEGATIVE Final    Comment: (NOTE) SARS-CoV-2 target nucleic acids are NOT DETECTED.  The SARS-CoV-2 RNA is generally detectable in upper respiratory specimens  during the acute phase of infection. The lowest concentration of SARS-CoV-2 viral copies this assay can detect is 138 copies/mL. A negative result does not preclude SARS-Cov-2 infection and should not be used as the sole basis for treatment or other patient management decisions. A negative result may occur with  improper specimen collection/handling, submission of specimen other than nasopharyngeal swab, presence of viral mutation(s) within the areas targeted by this assay, and inadequate number of viral copies(<138 copies/mL). A negative result must be combined with clinical observations, patient history, and epidemiological information. The expected result is Negative.  Fact Sheet for Patients:  BloggerCourse.com  Fact Sheet for Healthcare  Providers:  SeriousBroker.it  This test is no t yet approved or cleared by the [place]  FDA and  has been authorized for detection and/or diagnosis of SARS-CoV-2 by FDA under an Emergency Use Authorization (EUA). This EUA will remain  in effect (meaning this test can be used) for the duration of the COVID-19 declaration under Section 564(b)(1) of the Act, 21 U.S.C.section 360bbb-3(b)(1), unless the authorization is terminated  or revoked sooner.       Influenza A by PCR NEGATIVE NEGATIVE Final   Influenza B by PCR NEGATIVE NEGATIVE Final    Comment: (NOTE) The Xpert Xpress SARS-CoV-2/FLU/RSV plus assay is intended as an aid in the diagnosis of influenza from Nasopharyngeal swab specimens and should not be used as a sole basis for treatment. Nasal washings and aspirates are unacceptable for Xpert Xpress SARS-CoV-2/FLU/RSV testing.  Fact Sheet for Patients: BloggerCourse.com  Fact Sheet for Healthcare Providers: SeriousBroker.it  This test is not yet approved or cleared by the [place]  FDA and has been authorized for detection and/or diagnosis of SARS-CoV-2 by FDA under an Emergency Use Authorization (EUA). This EUA will remain in effect (meaning this test can be used) for the duration of the COVID-19 declaration under Section 564(b)(1) of the Act, 21 U.S.C. section 360bbb-3(b)(1), unless the authorization is terminated or revoked.  Performed at [facility], [address] [place], [place] [address]   Resp Panel by RT-PCR (Flu A&B, Covid) Nasopharyngeal Swab     Status: None   Collection Time: 07/10/21  2:01 PM   Specimen: Nasopharyngeal Swab; Nasopharyngeal(NP) swabs in vial transport medium  Result Value Ref Range Status   SARS Coronavirus 2 by RT PCR NEGATIVE NEGATIVE Final    Comment: (NOTE) SARS-CoV-2 target nucleic acids are NOT DETECTED.  The SARS-CoV-2 RNA is generally  detectable in upper respiratory specimens during the acute phase of infection. The lowest concentration of SARS-CoV-2 viral copies this assay can detect is 138 copies/mL. A negative result does not preclude SARS-Cov-2 infection and should not be used as the sole basis for treatment or other patient management decisions. A negative result may occur with  improper specimen collection/handling, submission of specimen other than nasopharyngeal swab, presence of viral mutation(s) within the areas targeted by this assay, and inadequate number of viral copies(<138 copies/mL). A negative result must be combined with clinical observations, patient history, and epidemiological information. The expected result is Negative.  Fact Sheet for Patients:  BloggerCourse.com  Fact Sheet for Healthcare Providers:  SeriousBroker.it  This test is no t yet approved or cleared by the [place]  FDA and  has been authorized for detection and/or diagnosis of SARS-CoV-2 by FDA under an Emergency Use Authorization (EUA). This EUA will remain  in effect (meaning this test can be used) for the duration of the COVID-19 declaration under Section 564(b)(1) of the Act, 21  U.S.C.section 360bbb-3(b)(1), unless the authorization is terminated  or revoked sooner.       Influenza A by PCR NEGATIVE NEGATIVE Final   Influenza B by PCR NEGATIVE NEGATIVE Final    Comment: (NOTE) The Xpert Xpress SARS-CoV-2/FLU/RSV plus assay is intended as an aid in the diagnosis of influenza from Nasopharyngeal swab specimens and should not be used as a sole basis for treatment. Nasal washings and aspirates are unacceptable for Xpert Xpress SARS-CoV-2/FLU/RSV testing.  Fact Sheet for Patients: BloggerCourse.com  Fact Sheet for Healthcare Providers: SeriousBroker.it  This test is not yet approved or cleared by the [place]  FDA  and has been authorized for detection and/or diagnosis of SARS-CoV-2 by FDA under an Emergency Use Authorization (EUA). This EUA will remain in effect (meaning this test can be used) for the duration of the COVID-19 declaration under Section 564(b)(1) of the Act, 21 U.S.C. section 360bbb-3(b)(1), unless the authorization is terminated or revoked.  Performed at [facility], [address] [place], [place] [address]     Time coordinating discharge: Over 30 minutes  Ree Candy, MD  [facility] 01/27/2024, 9:54 AM

## 2024-03-22 ENCOUNTER — Encounter: Payer: Self-pay | Admitting: Urology

## 2024-03-22 ENCOUNTER — Ambulatory Visit (INDEPENDENT_AMBULATORY_CARE_PROVIDER_SITE_OTHER): Admitting: Physician Assistant

## 2024-03-22 ENCOUNTER — Ambulatory Visit (INDEPENDENT_AMBULATORY_CARE_PROVIDER_SITE_OTHER): Admitting: Urology

## 2024-03-22 VITALS — BP 147/77 | HR 86 | Wt 138.5 lb

## 2024-03-22 DIAGNOSIS — R339 Retention of urine, unspecified: Secondary | ICD-10-CM

## 2024-03-22 LAB — BLADDER SCAN AMB NON-IMAGING: Scan Result: 0 mL

## 2024-03-22 MED ORDER — CEFDINIR 300 MG PO CAPS: 300.0000 mg | ORAL_CAPSULE | Freq: Two times a day (BID) | ORAL | 0 refills | Status: AC

## 2024-03-22 NOTE — Progress Notes (Signed)
 Catheter Removal  Patient is present today for a catheter removal.  7ml of water was drained from the balloon. A 14FR foley cath was removed from the bladder, no complications were noted. Patient tolerated well.  Performed by: Laymon Ned, CMA  Follow up/ Additional notes: follow up this afternoon @3 :40pm with Sam

## 2024-03-22 NOTE — Progress Notes (Signed)
 03/22/2024 9:06 AM   Nicole Ramsey May 20, 1927 [id number]  Referring provider: Lenon Marien CROME, MD [address], [address] [place],  [place] [address]  Chief Complaint  Patient presents with   Follow-up    HPI: Nicole Ramsey is a 89 y.o. female with a history of dementia presents for catheter removal/voiding trial.  Admitted 01/16/2024 after a fall suffering a displaced comminuted right femur fracture s/p ORIF 01/17/2024 Developed urinary retention postop and failed a voiding trial prior to discharge. She was unable to provide a history.  Nursing staff from her SNF was with her and states she has not had a repeat voiding trial.   PMH: Past Medical History:  Diagnosis Date   Hypertension    Thyroid  disease     Surgical History: Past Surgical History:  Procedure Laterality Date   INTRAMEDULLARY (IM) NAIL INTERTROCHANTERIC Right 01/17/2024   Procedure: FIXATION, FRACTURE, INTERTROCHANTERIC, WITH INTRAMEDULLARY ROD;  Surgeon: Edie Norleen PARAS, MD;  Location: [facility] ORS;  Service: Orthopedics;  Laterality: Right;    Home Medications:  Allergies as of 03/22/2024       Reactions   Aspirin Other (See Comments)   Elevated blood pressure        Medication List        Accurate as of March 22, 2024  9:06 AM. If you have any questions, ask your nurse or doctor.          STOP taking these medications    enoxaparin  40 MG/0.4ML injection Commonly known as: LOVENOX  Stopped by: Glendia JAYSON Barba   feeding supplement Liqd Stopped by: Glendia JAYSON Barba       TAKE these medications    acetaminophen  500 MG tablet Commonly known as: TYLENOL  Take 1-2 tablets (500-1,000 mg total) by mouth every 6 (six) hours as needed for moderate pain (pain score 4-6).   amLODipine  10 MG tablet Commonly known as: NORVASC  Take 10 mg by mouth daily.   bisacodyl  10 MG suppository Commonly known as: DULCOLAX Place 1 suppository (10 mg total) rectally daily as needed for moderate  constipation.   citalopram  10 MG tablet Commonly known as: CELEXA  Take 10 mg by mouth daily.   levothyroxine  50 MCG tablet Commonly known as: SYNTHROID  Take 50 mcg by mouth daily.   magnesium  hydroxide 400 MG/5ML suspension Commonly known as: MILK OF MAGNESIA Take 30 mLs by mouth daily as needed for mild constipation.   meclizine  25 MG tablet Commonly known as: ANTIVERT  Take 25 mg by mouth 3 (three) times daily as needed for dizziness.   naloxone 4 MG/0.1ML Liqd nasal spray kit Commonly known as: NARCAN Place 1 spray into the nose.   oxyCODONE -acetaminophen  5-325 MG tablet Commonly known as: PERCOCET/ROXICET Take 1-2 tablets by mouth every 6 (six) hours as needed for severe pain (pain score 7-10).   pantoprazole  40 MG tablet Commonly known as: PROTONIX  Take 1 tablet (40 mg total) by mouth 2 (two) times daily.   polyethylene glycol 17 g packet Commonly known as: MIRALAX  / GLYCOLAX  Take 17 g by mouth daily.   QUEtiapine  25 MG tablet Commonly known as: SEROQUEL  Take 25 mg by mouth at bedtime.   Vitamin D (Ergocalciferol) 1.25 MG (50000 UNIT) Caps capsule Commonly known as: DRISDOL Take 50,000 Units by mouth once a week.        Allergies:  Allergies  Allergen Reactions   Aspirin Other (See Comments)    Elevated blood pressure    Family History: Family History  Problem Relation  Age of Onset   Stroke Mother    Stroke Sister     Social History:  reports that she has never smoked. She has never used smokeless tobacco. She reports that she does not currently use alcohol. She reports that she does not use drugs.   Physical Exam: BP (!) 147/77   Pulse 86   Wt 138 lb 8 oz (62.8 kg)   BMI 21.69 kg/m   Constitutional:  Alert, No acute distress. HEENT: [place] AT Respiratory: Normal respiratory effort, no increased work of breathing.    Assessment & Plan:    1.  Urinary retention Catheter was removed She is scheduled this afternoon for a bladder  scan   Glendia JAYSON Barba, MD  [facility] [address], [address] [place], [place] [address] [phone]

## 2024-03-22 NOTE — Progress Notes (Signed)
 Afternoon follow-up  Patient returned to clinic this afternoon for PVR. She has been able to urinate. PVR 0mL.  Results for orders placed or performed in visit on 03/22/24  BLADDER SCAN AMB NON-IMAGING   Collection Time: 03/22/24  5:22 PM  Result Value Ref Range   Scan Result 0 ml    Voiding trial passed.  Will treat with 3 days of empiric Omnicef to sterilize the urine in the setting of Foley catheter and plan to see her back in a month for repeat PVR.  Follow up: Return in about 4 weeks (around 04/19/2024) for Repeat PVR.

## 2024-04-21 ENCOUNTER — Ambulatory Visit (INDEPENDENT_AMBULATORY_CARE_PROVIDER_SITE_OTHER): Admitting: Physician Assistant

## 2024-04-21 VITALS — BP 131/83 | HR 82 | Ht 66.0 in | Wt 138.0 lb

## 2024-04-21 DIAGNOSIS — R339 Retention of urine, unspecified: Secondary | ICD-10-CM | POA: Diagnosis not present

## 2024-04-21 LAB — BLADDER SCAN AMB NON-IMAGING

## 2024-04-21 NOTE — Progress Notes (Signed)
 04/21/2024 4:54 PM   Nicole Ramsey Mar 21, 1927 [id number]  CC: Chief Complaint  Patient presents with   Urinary Retention   HPI: Nicole Ramsey is a 89 y.o. female with PMH dementia with a recent history of urinary retention in the setting of right femur fracture s/p ORIF who passed an outpatient voiding trial with us  last month and presents today for repeat PVR.  Today she reports she continues to void without difficulty or dysuria.  She denies urinary leakage.  PVR 67 mL.  She has never seen a urologist before.  PMH: Past Medical History:  Diagnosis Date   Hypertension    Thyroid  disease     Surgical History: Past Surgical History:  Procedure Laterality Date   INTRAMEDULLARY (IM) NAIL INTERTROCHANTERIC Right 01/17/2024   Procedure: FIXATION, FRACTURE, INTERTROCHANTERIC, WITH INTRAMEDULLARY ROD;  Surgeon: Edie Norleen PARAS, MD;  Location: [facility] ORS;  Service: Orthopedics;  Laterality: Right;    Home Medications:  Allergies as of 04/21/2024       Reactions   Aspirin Other (See Comments)   Elevated blood pressure        Medication List        Accurate as of April 21, 2024  4:54 PM. If you have any questions, ask your nurse or doctor.          acetaminophen  500 MG tablet Commonly known as: TYLENOL  Take 1-2 tablets (500-1,000 mg total) by mouth every 6 (six) hours as needed for moderate pain (pain score 4-6).   amLODipine  10 MG tablet Commonly known as: NORVASC  Take 10 mg by mouth daily.   bisacodyl  10 MG suppository Commonly known as: DULCOLAX Place 1 suppository (10 mg total) rectally daily as needed for moderate constipation.   citalopram  10 MG tablet Commonly known as: CELEXA  Take 10 mg by mouth daily.   levothyroxine  50 MCG tablet Commonly known as: SYNTHROID  Take 50 mcg by mouth daily.   magnesium  hydroxide 400 MG/5ML suspension Commonly known as: MILK OF MAGNESIA Take 30 mLs by mouth daily as needed for mild constipation.   meclizine  25  MG tablet Commonly known as: ANTIVERT  Take 25 mg by mouth 3 (three) times daily as needed for dizziness.   naloxone 4 MG/0.1ML Liqd nasal spray kit Commonly known as: NARCAN Place 1 spray into the nose.   oxyCODONE -acetaminophen  5-325 MG tablet Commonly known as: PERCOCET/ROXICET Take 1-2 tablets by mouth every 6 (six) hours as needed for severe pain (pain score 7-10).   pantoprazole  40 MG tablet Commonly known as: PROTONIX  Take 1 tablet (40 mg total) by mouth 2 (two) times daily.   polyethylene glycol 17 g packet Commonly known as: MIRALAX  / GLYCOLAX  Take 17 g by mouth daily.   QUEtiapine  25 MG tablet Commonly known as: SEROQUEL  Take 25 mg by mouth at bedtime.   Vitamin D (Ergocalciferol) 1.25 MG (50000 UNIT) Caps capsule Commonly known as: DRISDOL Take 50,000 Units by mouth once a week.        Allergies:  Allergies  Allergen Reactions   Aspirin Other (See Comments)    Elevated blood pressure    Family History: Family History  Problem Relation Age of Onset   Stroke Mother    Stroke Sister     Social History:   reports that she has never smoked. She has never used smokeless tobacco. She reports that she does not currently use alcohol. She reports that she does not use drugs.  Physical Exam: BP 131/83   Pulse 82  Ht 5' 6 (1.676 m)   Wt 138 lb (62.6 kg)   BMI 22.27 kg/m   Constitutional:  Alert, no acute distress, nontoxic appearing HEENT: [place], AT Cardiovascular: No clubbing, cyanosis, or edema Respiratory: Normal respiratory effort, no increased work of breathing Skin: No rashes, bruises or suspicious lesions Neurologic: Grossly intact, no focal deficits, moving all 4 extremities Psychiatric: Normal mood and affect  Laboratory Data: Results for orders placed or performed in visit on 04/21/24  BLADDER SCAN AMB NON-IMAGING   Collection Time: 04/21/24  1:25 PM  Result Value Ref Range   Scan Result 67ml    Assessment & Plan:   1. Urinary retention  (Primary) Resolved, no acute concerns today.  She may follow-up with us  as needed. - BLADDER SCAN AMB NON-IMAGING  Return if symptoms worsen or fail to improve.  Lucie Hones, PA-C  [facility] [place] [address] [place], [place] [address] [phone]
